# Patient Record
Sex: Female | Born: 1950 | Race: White | Hispanic: No | Marital: Single | Smoking: Current every day smoker
Health system: Southern US, Community
[De-identification: ages and names within clinical notes are randomized; demographics above are authoritative.]

## PROBLEM LIST (undated history)

## (undated) DIAGNOSIS — Z720 Tobacco use: Secondary | ICD-10-CM

## (undated) DIAGNOSIS — F419 Anxiety disorder, unspecified: Secondary | ICD-10-CM

## (undated) DIAGNOSIS — F329 Major depressive disorder, single episode, unspecified: Secondary | ICD-10-CM

## (undated) DIAGNOSIS — E785 Hyperlipidemia, unspecified: Secondary | ICD-10-CM

## (undated) DIAGNOSIS — J329 Chronic sinusitis, unspecified: Secondary | ICD-10-CM

## (undated) DIAGNOSIS — F32A Depression, unspecified: Secondary | ICD-10-CM

## (undated) DIAGNOSIS — R011 Cardiac murmur, unspecified: Secondary | ICD-10-CM

## (undated) DIAGNOSIS — K219 Gastro-esophageal reflux disease without esophagitis: Secondary | ICD-10-CM

## (undated) DIAGNOSIS — K649 Unspecified hemorrhoids: Secondary | ICD-10-CM

## (undated) DIAGNOSIS — I739 Peripheral vascular disease, unspecified: Secondary | ICD-10-CM

## (undated) HISTORY — DX: Chronic sinusitis, unspecified: J32.9

## (undated) HISTORY — PX: HEMORRHOIDECTOMY WITH HEMORRHOID BANDING: SHX5633

## (undated) HISTORY — DX: Major depressive disorder, single episode, unspecified: F32.9

## (undated) HISTORY — DX: Unspecified hemorrhoids: K64.9

## (undated) HISTORY — PX: APPENDECTOMY: SHX54

## (undated) HISTORY — PX: CARDIAC CATHETERIZATION: SHX172

## (undated) HISTORY — DX: Hyperlipidemia, unspecified: E78.5

## (undated) HISTORY — DX: Depression, unspecified: F32.A

## (undated) SURGERY — LEFT HEART CATH AND CORONARY ANGIOGRAPHY
Anesthesia: Moderate Sedation | Laterality: Right

---

## 1975-07-04 HISTORY — PX: TUBAL LIGATION: SHX77

## 1989-07-03 HISTORY — PX: ABDOMINAL HYSTERECTOMY: SHX81

## 2008-01-07 DIAGNOSIS — F418 Other specified anxiety disorders: Secondary | ICD-10-CM | POA: Insufficient documentation

## 2012-07-03 HISTORY — PX: COLONOSCOPY: SHX174

## 2012-12-12 DIAGNOSIS — K259 Gastric ulcer, unspecified as acute or chronic, without hemorrhage or perforation: Secondary | ICD-10-CM | POA: Insufficient documentation

## 2013-03-19 DIAGNOSIS — Z0000 Encounter for general adult medical examination without abnormal findings: Secondary | ICD-10-CM | POA: Insufficient documentation

## 2013-03-19 DIAGNOSIS — N952 Postmenopausal atrophic vaginitis: Secondary | ICD-10-CM | POA: Insufficient documentation

## 2014-05-18 DIAGNOSIS — Z720 Tobacco use: Secondary | ICD-10-CM | POA: Insufficient documentation

## 2014-05-18 DIAGNOSIS — Z79899 Other long term (current) drug therapy: Secondary | ICD-10-CM | POA: Insufficient documentation

## 2014-05-18 DIAGNOSIS — K644 Residual hemorrhoidal skin tags: Secondary | ICD-10-CM | POA: Insufficient documentation

## 2014-05-27 ENCOUNTER — Ambulatory Visit: Payer: Self-pay | Admitting: General Surgery

## 2014-06-01 ENCOUNTER — Encounter: Payer: Self-pay | Admitting: General Surgery

## 2014-06-01 ENCOUNTER — Ambulatory Visit (INDEPENDENT_AMBULATORY_CARE_PROVIDER_SITE_OTHER): Payer: BC Managed Care – PPO | Admitting: General Surgery

## 2014-06-01 VITALS — BP 124/68 | HR 70 | Resp 12 | Ht 66.0 in | Wt 141.0 lb

## 2014-06-01 DIAGNOSIS — K648 Other hemorrhoids: Secondary | ICD-10-CM

## 2014-06-01 DIAGNOSIS — K644 Residual hemorrhoidal skin tags: Secondary | ICD-10-CM

## 2014-06-01 NOTE — Progress Notes (Signed)
Patient ID: Bethany Lozano, female   DOB: 11-26-1950, 63 y.o.   MRN: [id number]  Chief Complaint  Patient presents with  . Rectal Bleeding    HPI Bethany Lozano is a 63 y.o. female here today for evaluation of rectal bleeding. Patient states his has been going over the last 30 years. She had a hemorrhoidectomy and banding performed in the past. Reports she previously only had bleeding 1-2x/year but is currently have bleeding at least 1x/month with accompanying pressure sensation. Her last colonoscopy 09/11/12. She states no pain. Reports feeling as she has increased urgency of bowels HPI  Past Medical History  Diagnosis Date  . Hyperlipidemia   . Hemorrhoids   . Depression   . Sinusitis     Past Surgical History  Procedure Laterality Date  . Hemorrhoidectomy with hemorrhoid banding  1991  . Abdominal hysterectomy  1991  . Tubal ligation  1977  . Colonoscopy  2014    History reviewed. No pertinent family history.  Social History History  Substance Use Topics  . Smoking status: Current Every Day Smoker -- 1.00 packs/day for 40 years    Types: Cigarettes  . Smokeless tobacco: Never Used  . Alcohol Use: 0.0 oz/week    0 Not specified per week    Allergies  Allergen Reactions  . Codeine Nausea Only  . Penicillins Rash    Current Outpatient Prescriptions  Medication Sig Dispense Refill  . aspirin 81 MG tablet Take 81 mg by mouth daily.    . clobetasol (TEMOVATE) 0.05 % GEL   5  . clonazePAM (KLONOPIN) 0.5 MG tablet Take 0.5 mg by mouth 2 (two) times daily as needed.   0  . fenofibrate 160 MG tablet Take 160 mg by mouth daily.   0  . fluticasone (FLONASE) 50 MCG/ACT nasal spray Place 1 spray into both nostrils daily.  0  . mometasone (ELOCON) 0.1 % cream Apply 1 application topically daily.   5  . montelukast (SINGULAIR) 10 MG tablet   0  . PREMARIN 0.3 MG tablet Take 0.3 mg by mouth daily.   3  . VAGIFEM 10 MCG TABS vaginal tablet   2  . VIIBRYD 40 MG TABS 40 mg daily.    0   No current facility-administered medications for this visit.    Review of Systems Review of Systems  Constitutional: Negative.   Respiratory: Negative.   Cardiovascular: Negative.     Blood pressure 124/68, pulse 70, resp. rate 12, height 5\' 6"  (1.676 m), weight 141 lb (63.957 kg).  Physical Exam Physical Exam  Constitutional: She is oriented to person, place, and time. She appears well-developed and well-nourished.  Cardiovascular: Normal rate, regular rhythm and normal heart sounds.   Pulmonary/Chest: Effort normal and breath sounds normal.  Abdominal: Soft. Bowel sounds are normal. There is no hepatosplenomegaly.  Genitourinary:  Irregular shaped external hemorrhoid on left side, not inflamed currently  Also 1 cm internal hemorrhoid noted at 3oclock Digital exam normal  Neurological: She is alert and oriented to person, place, and time.  Skin: Skin is warm and dry.    Data Reviewed Notes reviews  Assessment   internal hemorrhoid, L side. Likely the source of bleeding External hemorrhoid asymptomatic  Discussed this in full with pt and she is agreeable to hemorrhoid banding   Plan    Patient to return for anoscopy and hemorrhoid banding with fleets enema prior to procedure.       Tehani Mersman G 06/01/2014, 2:53 PM

## 2014-06-01 NOTE — Patient Instructions (Signed)
Patient to return for hemorrhoid banding with enema bowel prep.

## 2014-06-09 ENCOUNTER — Ambulatory Visit (INDEPENDENT_AMBULATORY_CARE_PROVIDER_SITE_OTHER): Payer: BC Managed Care – PPO | Admitting: General Surgery

## 2014-06-09 ENCOUNTER — Encounter: Payer: Self-pay | Admitting: General Surgery

## 2014-06-09 VITALS — BP 124/60 | HR 72 | Resp 12 | Ht 65.0 in | Wt 142.0 lb

## 2014-06-09 DIAGNOSIS — K648 Other hemorrhoids: Secondary | ICD-10-CM

## 2014-06-09 NOTE — Progress Notes (Signed)
The patient presents for a procedure today. The procedure to be performed is hemorrhoid banding. The patient denies any new complaints at this time.  Pat placed in left lateral position. Anal area prepped with betadine. 1ml 1% xylocaine instilled at base of internal hemorrhoid on left. The hemorrhoid was successfully banded. No discomfort noted by pt. F/U in 3-4 weeks.

## 2014-06-09 NOTE — Patient Instructions (Signed)
Patient to return in 3-4 weeks for follow up. The patient is aware to call back for any questions or concerns.     

## 2014-07-09 ENCOUNTER — Ambulatory Visit (INDEPENDENT_AMBULATORY_CARE_PROVIDER_SITE_OTHER): Payer: Self-pay | Admitting: General Surgery

## 2014-07-09 ENCOUNTER — Encounter: Payer: Self-pay | Admitting: General Surgery

## 2014-07-09 VITALS — BP 130/70 | HR 80 | Resp 14 | Ht 66.0 in | Wt 143.0 lb

## 2014-07-09 DIAGNOSIS — K648 Other hemorrhoids: Secondary | ICD-10-CM

## 2014-07-09 NOTE — Patient Instructions (Signed)
Patient to return as needed. The patient is aware to call back for any questions or concerns. 

## 2014-07-09 NOTE — Progress Notes (Signed)
Patient ID: Bethany Lozano, female   DOB: 03/07/1951, 64 y.o.   MRN: [id number]   The patient presents for a follow up of hemorrhoid banding. She states the bleeding has subsided.  Rectal exam shows 2 less than 5 mm internal hemorrhoids. The large banded hemorrhoid is not seen.  She is doing great. No new complaints. Follow up as needed.

## 2014-07-10 ENCOUNTER — Encounter: Payer: Self-pay | Admitting: General Surgery

## 2014-10-13 ENCOUNTER — Ambulatory Visit (INDEPENDENT_AMBULATORY_CARE_PROVIDER_SITE_OTHER): Payer: BLUE CROSS/BLUE SHIELD | Admitting: General Surgery

## 2014-10-13 ENCOUNTER — Encounter: Payer: Self-pay | Admitting: General Surgery

## 2014-10-13 VITALS — BP 120/66 | HR 72 | Resp 12 | Ht 66.0 in | Wt 140.0 lb

## 2014-10-13 DIAGNOSIS — K602 Anal fissure, unspecified: Secondary | ICD-10-CM

## 2014-10-13 DIAGNOSIS — K648 Other hemorrhoids: Secondary | ICD-10-CM | POA: Diagnosis not present

## 2014-10-13 NOTE — Progress Notes (Signed)
Patient ID: Bethany Lozano, female   DOB: 07/13/1950, 64 y.o.   MRN: [id number]  Chief Complaint  Patient presents with  . Rectal Bleeding    HPI Bethany Lozano is a 64 y.o. female.here today for a evaluation if rectal bleeding. Patient states this has been going on since 10/09/14  . She states when she wrap there is clots of blood on the paper and in the bowl. No pain.. She had a hemorrhoid banding in 2015.  HPI  Past Medical History  Diagnosis Date  . Hyperlipidemia   . Hemorrhoids   . Depression   . Sinusitis     Past Surgical History  Procedure Laterality Date  . Hemorrhoidectomy with hemorrhoid banding  1991,06/20/14  . Abdominal hysterectomy  1991  . Tubal ligation  1977  . Colonoscopy  2014    History reviewed. No pertinent family history.  Social History History  Substance Use Topics  . Smoking status: Current Every Day Smoker -- 1.00 packs/day for 40 years    Types: Cigarettes  . Smokeless tobacco: Never Used  . Alcohol Use: 0.0 oz/week    0 Standard drinks or equivalent per week    Allergies  Allergen Reactions  . Codeine Nausea Only  . Penicillins Rash    Current Outpatient Prescriptions  Medication Sig Dispense Refill  . aspirin 81 MG tablet Take 81 mg by mouth daily.    Marland Kitchen atorvastatin (LIPITOR) 40 MG tablet Take 40 mg by mouth daily.    . clobetasol (TEMOVATE) 0.05 % GEL   5  . clonazePAM (KLONOPIN) 0.5 MG tablet Take 0.5 mg by mouth 2 (two) times daily as needed.   0  . fenofibrate 160 MG tablet Take 160 mg by mouth daily.   0  . fluticasone (FLONASE) 50 MCG/ACT nasal spray Place 1 spray into both nostrils daily.  0  . mometasone (ELOCON) 0.1 % cream Apply 1 application topically daily.   5  . montelukast (SINGULAIR) 10 MG tablet   0  . PREMARIN 0.3 MG tablet Take 0.3 mg by mouth daily.   3  . VIIBRYD 40 MG TABS 20 mg daily.   0   No current facility-administered medications for this visit.    Review of Systems Review of Systems  Constitutional:  Negative.   Respiratory: Negative.   Cardiovascular: Negative.     Blood pressure 120/66, pulse 72, resp. rate 12, height  (1.676 m), weight 140 lb (63.504 kg).  Physical Exam Physical Exam  Constitutional: She is oriented to person, place, and time. She appears well-developed and well-nourished.  Eyes: Conjunctivae are normal. No scleral icterus.  Neck: Neck supple.  Cardiovascular: Normal rate and regular rhythm.   Murmur heard.  Systolic murmur is present with a grade of 2/6  Pulmonary/Chest: Effort normal.  Abdominal: Soft. Bowel sounds are normal. There is no tenderness.  Genitourinary: Rectal exam shows external hemorrhoid and internal hemorrhoid.     Lymphadenopathy:    She has no cervical adenopathy.  Neurological: She is alert and oriented to person, place, and time.  Skin: Skin is warm and dry.    Data Reviewed Prior note  Assessment    Internal hemorrhoid and possible anal fissure     Plan    Pt has had banding before. Given long history of bleeding will benefit with hemorrhoidectomy and sphincterotomy. Discussed fully with pt and she is agreeable.    Patient is scheduled for surgery at [facility] on 10/22/14. She will pre admit  by phone. Patient is aware of dates, and instructions.   PCP:  Evelena Asaejan-Sie SANKAR,SEEPLAPUTHUR G 10/13/2014, 10:14 AM

## 2014-10-13 NOTE — Patient Instructions (Signed)
Hemorrhoidectomy Hemorrhoidectomy is surgery to remove hemorrhoids. Hemorrhoids are veins that have become swollen in the rectum. The rectum is the area from the bottom end of the intestines to the opening where bowel movements leave the body. Hemorrhoids can be uncomfortable. They can cause itching, bleeding and pain if a blood clot forms in them (thrombose). If hemorrhoids are small, surgery may not be needed. But if they cover a larger area, surgery is usually suggested.  LET YOUR CAREGIVER KNOW ABOUT:   Any allergies.  All medications you are taking, including:  Herbs, eyedrops, over-the-counter medications and creams.  Blood thinners (anticoagulants), aspirin or other drugs that could affect blood clotting.  Use of steroids (by mouth or as creams).  Previous problems with anesthetics, including local anesthetics.  Possibility of pregnancy, if this applies.  Any history of blood clots.  Any history of bleeding or other blood problems.  Previous surgery.  Smoking history.  Other health problems. RISKS AND COMPLICATIONS All surgery carries some risk. However, hemorrhoid surgery usually goes smoothly. Possible complications could include:  Urinary retention.  Bleeding.  Infection.  A painful incision.  A reaction to the anesthesia (this is not common). BEFORE THE PROCEDURE   Stop using aspirin and non-steroidal anti-inflammatory drugs (NSAIDs) for pain relief. This includes prescription drugs and over-the-counter drugs such as ibuprofen and naproxen. Also stop taking vitamin E. If possible, do this two weeks before your surgery.  If you take blood-thinners, ask your healthcare provider when you should stop taking them.  You will probably have blood and urine tests done several days before your surgery.  Do not eat or drink for about 8 hours before the surgery.  Arrive at least an hour before the surgery, or whenever your surgeon recommends. This will give you time to  check in and fill out any needed paperwork.  Hemorrhoidectomy is often an outpatient procedure. This means you will be able to go home the same day. Sometimes, though, people stay overnight in the hospital after the procedure. Ask your surgeon what to expect. Either way, make arrangements in advance for someone to drive you home. PROCEDURE   The preparation:  You will change into a hospital gown.  You will be given an IV. A needle will be inserted in your arm. Medication can flow directly into your body through this needle.  You might be given an enema to clear your rectum.  Once in the operating room, you will probably lie on your side or be repositioned later to lying on your stomach.  You will be given anesthesia (medication) so you will not feel anything during the surgery. The surgery often is done with local anesthesia (the area near the hemorrhoids will be numb and you will be drowsy but awake). Sometimes, general anesthesia is used (you will be asleep during the procedure).  The procedure:  There are a few different procedures for hemorrhoids. Be sure to ask you surgeon about the procedure, the risks and benefits.  Be sure to ask about what you need to do to take care of the wound, if there is one. AFTER THE PROCEDURE  You will stay in a recovery area until the anesthesia has worn off. Your blood pressure and pulse will be checked every so often.  You may feel a lot of pain in the area of the rectum.  Take all pain medication prescribed by your surgeon. Ask before taking any over-the-counter pain medicines.  Sometimes sitting in a warm bath can help relieve   your pain.  To make sure you have bowel movements without straining:  You will probably need to take stool softeners (usually a pill) for a few days.  You should drink 8 to 10 glasses of water each day.  Your activity will be restricted for awhile. Ask your caregiver for a list of what you should and should not do  while you recover. Document Released: 04/16/2009 Document Revised: 09/11/2011 Document Reviewed: 04/16/2009 ExitCare Patient Information 2015 [employer]. This information is not intended to replace advice given to you by your health care provider. Make sure you discuss any questions you have with your health care provider.  

## 2014-10-22 ENCOUNTER — Telehealth: Payer: Self-pay | Admitting: *Deleted

## 2014-10-22 ENCOUNTER — Telehealth: Payer: Self-pay

## 2014-10-22 ENCOUNTER — Encounter: Payer: Self-pay | Admitting: General Surgery

## 2014-10-22 ENCOUNTER — Ambulatory Visit
Admit: 2014-10-22 | Disposition: A | Discharge status: Home / Self Care | Payer: Self-pay | Attending: General Surgery | Admitting: General Surgery

## 2014-10-22 DIAGNOSIS — K648 Other hemorrhoids: Secondary | ICD-10-CM | POA: Diagnosis not present

## 2014-10-22 DIAGNOSIS — K602 Anal fissure, unspecified: Secondary | ICD-10-CM | POA: Diagnosis not present

## 2014-10-22 HISTORY — PX: FISSURECTOMY: SHX5244

## 2014-10-22 NOTE — Telephone Encounter (Signed)
Advised to use salt water gargles or throat lozenges. Tylenol or Advil for pain. Monitor for new symptoms and call if any new symptoms occur, pt agrees.

## 2014-10-22 NOTE — Telephone Encounter (Signed)
Patient called concerned about a raised bruise area at her IV site. She states that it is about 1" across, she denies any redness or pain. Patient instructed to apply cold compress to area several times today and it should resolve itself over the next few days. She will call if she has any further problems.

## 2014-10-22 NOTE — Telephone Encounter (Signed)
Pt has surgery today and she is having a sore throat, she wanted to talk to you about it.

## 2014-10-26 ENCOUNTER — Encounter: Payer: Self-pay | Admitting: General Surgery

## 2014-10-26 LAB — SURGICAL PATHOLOGY

## 2014-11-01 NOTE — Op Note (Signed)
PATIENT NAME:  Bethany MorelHOWLE, Bethany E MR#:  [id number] DATE OF BIRTH:  [birth year]  DATE OF PROCEDURE:  10/22/2014  PREOPERATIVE DIAGNOSIS: Hemorrhoid and anal fissure.   POSTOPERATIVE DIAGNOSIS: Internal and external hemorrhoid on the left posterior aspect and a small posterior anal fissure.   OPERATION PERFORMED: Internal, external hemorrhoidectomy and fissure repair sphincterotomy.   ANESTHESIA: General.   COMPLICATIONS: None.   ESTIMATED BLOOD LOSS: Minimal.   DRAINS: None.   DESCRIPTION OF PROCEDURE: The patient was put to sleep with an LMA and placed in the lithotomy position. The anal area was prepped and draped out as a sterile field. Timeout was performed. Digital and speculum examination was completed, showing a well-defined internal hemorrhoid located around the 5:00 location just to the left of what appeared to be a slightly scarred flat anal fissure. Also, a small prominent external hemorrhoid was identified, not continuous with the internal hemorrhoid, in this same location. The internal hemorrhoid was first removed. It was clamped and excised out and then suture ligated with 3-0 Vicryl. The external hemorrhoid was then taken down with the use of a LigaSure device. 20 mL of Exparel for was instilled in the perianal region and in the intersphincteric groove for postoperative analgesia. Sphincterotomy was made on the right side of the 9 o'clock position with a small radial incision. The edge of the sphincter muscle was lifted up and cut with cautery. The opening was then closed with a 3-0 Vicryl stitch. Examination of the anal canal and the lower rectum showed no other abnormality. The area was covered with bacitracin ointment and the patient subsequently was extubated and returned to the recovery room in stable condition   ____________________________ S.Wynona LunaG. Sankar, MD sgs:AT D: 10/22/2014 09:05:07 ET T: 10/22/2014 11:17:31 ET JOB#: [id number]  cc: S.G. Evette CristalSankar, MD, <Dictator> [facility] Wynona LunaG  SANKAR MD ELECTRONICALLY SIGNED 10/23/2014 8:18

## 2014-11-02 ENCOUNTER — Ambulatory Visit (INDEPENDENT_AMBULATORY_CARE_PROVIDER_SITE_OTHER): Payer: BLUE CROSS/BLUE SHIELD | Admitting: General Surgery

## 2014-11-02 ENCOUNTER — Encounter: Payer: Self-pay | Admitting: General Surgery

## 2014-11-02 ENCOUNTER — Ambulatory Visit: Payer: BLUE CROSS/BLUE SHIELD | Admitting: General Surgery

## 2014-11-02 VITALS — BP 130/82 | HR 72 | Resp 14 | Ht 66.0 in | Wt 137.0 lb

## 2014-11-02 DIAGNOSIS — K602 Anal fissure, unspecified: Secondary | ICD-10-CM

## 2014-11-02 MED ORDER — HYDROCORTISONE 1 % EX CREA: 1.0000 "application " | TOPICAL_CREAM | Freq: Two times a day (BID) | CUTANEOUS | Status: DC

## 2014-11-02 NOTE — Patient Instructions (Signed)
Patient to return in 3-4 weeks.  

## 2014-11-02 NOTE — Progress Notes (Signed)
This is a 64 year old female here today for her post op hemorrhoidectomy and sphincterotomy done on 10/22/14. She states she is still bleeding and she see it when she wraps.  There is a well defined post anal fissure now- this was not noted last time.  Recurrent anal fissure. Trial of Nupercainol ointment Patient to return in 3-4 weeks.

## 2014-11-03 ENCOUNTER — Encounter: Payer: Self-pay | Admitting: General Surgery

## 2014-11-24 ENCOUNTER — Encounter: Payer: Self-pay | Admitting: General Surgery

## 2014-11-24 ENCOUNTER — Ambulatory Visit (INDEPENDENT_AMBULATORY_CARE_PROVIDER_SITE_OTHER): Payer: BLUE CROSS/BLUE SHIELD | Admitting: General Surgery

## 2014-11-24 VITALS — BP 122/74 | HR 72 | Resp 12 | Ht 66.0 in | Wt 136.0 lb

## 2014-11-24 DIAGNOSIS — K602 Anal fissure, unspecified: Secondary | ICD-10-CM

## 2014-11-24 NOTE — Progress Notes (Signed)
This is a 64 year old female here today for her three week follow up hemorrhoidectomy and sphincterotomy done on 10/22/14. Patient states she is doing well with no bleeding.  Fissure is well healed. No hemorrhoids seen Patient to return as needed.

## 2014-11-24 NOTE — Patient Instructions (Signed)
Patient to return as needed. 

## 2014-11-25 ENCOUNTER — Encounter: Payer: Self-pay | Admitting: General Surgery

## 2015-06-07 ENCOUNTER — Other Ambulatory Visit: Payer: Self-pay | Admitting: Internal Medicine

## 2015-06-07 DIAGNOSIS — Z1231 Encounter for screening mammogram for malignant neoplasm of breast: Secondary | ICD-10-CM

## 2015-06-21 ENCOUNTER — Ambulatory Visit
Admission: RE | Admit: 2015-06-21 | Discharge: 2015-06-21 | Disposition: A | Discharge status: Home / Self Care | Payer: BLUE CROSS/BLUE SHIELD | Source: Ambulatory Visit | Attending: Internal Medicine | Admitting: Internal Medicine

## 2015-06-21 DIAGNOSIS — Z1231 Encounter for screening mammogram for malignant neoplasm of breast: Secondary | ICD-10-CM | POA: Insufficient documentation

## 2015-10-21 DIAGNOSIS — I739 Peripheral vascular disease, unspecified: Secondary | ICD-10-CM | POA: Insufficient documentation

## 2015-10-21 DIAGNOSIS — R0989 Other specified symptoms and signs involving the circulatory and respiratory systems: Secondary | ICD-10-CM | POA: Insufficient documentation

## 2015-11-04 DIAGNOSIS — R079 Chest pain, unspecified: Secondary | ICD-10-CM | POA: Insufficient documentation

## 2015-11-08 ENCOUNTER — Encounter: Payer: Self-pay | Admitting: *Deleted

## 2015-11-08 ENCOUNTER — Other Ambulatory Visit: Payer: Self-pay | Admitting: Cardiovascular Disease

## 2015-11-08 ENCOUNTER — Ambulatory Visit
Admission: RE | Admit: 2015-11-08 | Discharge: 2015-11-08 | Disposition: A | Discharge status: Home / Self Care | Payer: BLUE CROSS/BLUE SHIELD | Source: Ambulatory Visit | Attending: Cardiovascular Disease | Admitting: Cardiovascular Disease

## 2015-11-08 ENCOUNTER — Encounter
Admission: RE | Disposition: A | Discharge status: Home / Self Care | Payer: Self-pay | Source: Ambulatory Visit | Attending: Cardiovascular Disease

## 2015-11-08 DIAGNOSIS — F418 Other specified anxiety disorders: Secondary | ICD-10-CM | POA: Insufficient documentation

## 2015-11-08 DIAGNOSIS — Z833 Family history of diabetes mellitus: Secondary | ICD-10-CM | POA: Insufficient documentation

## 2015-11-08 DIAGNOSIS — Z79899 Other long term (current) drug therapy: Secondary | ICD-10-CM | POA: Insufficient documentation

## 2015-11-08 DIAGNOSIS — I739 Peripheral vascular disease, unspecified: Secondary | ICD-10-CM | POA: Diagnosis not present

## 2015-11-08 DIAGNOSIS — I259 Chronic ischemic heart disease, unspecified: Secondary | ICD-10-CM | POA: Diagnosis not present

## 2015-11-08 DIAGNOSIS — I10 Essential (primary) hypertension: Secondary | ICD-10-CM | POA: Insufficient documentation

## 2015-11-08 DIAGNOSIS — Z880 Allergy status to penicillin: Secondary | ICD-10-CM | POA: Insufficient documentation

## 2015-11-08 DIAGNOSIS — E785 Hyperlipidemia, unspecified: Secondary | ICD-10-CM | POA: Insufficient documentation

## 2015-11-08 DIAGNOSIS — Z7951 Long term (current) use of inhaled steroids: Secondary | ICD-10-CM | POA: Insufficient documentation

## 2015-11-08 DIAGNOSIS — Z9889 Other specified postprocedural states: Secondary | ICD-10-CM | POA: Insufficient documentation

## 2015-11-08 DIAGNOSIS — R079 Chest pain, unspecified: Secondary | ICD-10-CM | POA: Diagnosis present

## 2015-11-08 DIAGNOSIS — I2510 Atherosclerotic heart disease of native coronary artery without angina pectoris: Secondary | ICD-10-CM | POA: Insufficient documentation

## 2015-11-08 DIAGNOSIS — F17200 Nicotine dependence, unspecified, uncomplicated: Secondary | ICD-10-CM | POA: Insufficient documentation

## 2015-11-08 DIAGNOSIS — Z8249 Family history of ischemic heart disease and other diseases of the circulatory system: Secondary | ICD-10-CM | POA: Insufficient documentation

## 2015-11-08 DIAGNOSIS — Z8711 Personal history of peptic ulcer disease: Secondary | ICD-10-CM | POA: Diagnosis not present

## 2015-11-08 DIAGNOSIS — R011 Cardiac murmur, unspecified: Secondary | ICD-10-CM | POA: Insufficient documentation

## 2015-11-08 DIAGNOSIS — Z7982 Long term (current) use of aspirin: Secondary | ICD-10-CM | POA: Diagnosis not present

## 2015-11-08 DIAGNOSIS — Z90710 Acquired absence of both cervix and uterus: Secondary | ICD-10-CM | POA: Insufficient documentation

## 2015-11-08 DIAGNOSIS — Z885 Allergy status to narcotic agent status: Secondary | ICD-10-CM | POA: Insufficient documentation

## 2015-11-08 HISTORY — PX: CARDIAC CATHETERIZATION: SHX172

## 2015-11-08 SURGERY — LEFT HEART CATH AND CORONARY ANGIOGRAPHY
Anesthesia: Moderate Sedation

## 2015-11-08 MED ORDER — SODIUM CHLORIDE 0.9% FLUSH: 3.0000 mL | INTRAVENOUS | Status: DC | PRN

## 2015-11-08 MED ORDER — SODIUM CHLORIDE 0.9 % WEIGHT BASED INFUSION
3.0000 mL/kg/h | INTRAVENOUS | Status: DC
  Administered 2015-11-08: 3 mL/kg/h via INTRAVENOUS

## 2015-11-08 MED ORDER — MIDAZOLAM HCL 2 MG/2ML IJ SOLN
INTRAMUSCULAR | Status: AC
  Filled 2015-11-08: qty 2

## 2015-11-08 MED ORDER — SODIUM CHLORIDE 0.9% FLUSH: 3.0000 mL | Freq: Two times a day (BID) | INTRAVENOUS | Status: DC

## 2015-11-08 MED ORDER — ASPIRIN 81 MG PO CHEW
81.0000 mg | CHEWABLE_TABLET | ORAL | Status: AC
  Administered 2015-11-08: 81 mg via ORAL

## 2015-11-08 MED ORDER — FENTANYL CITRATE (PF) 100 MCG/2ML IJ SOLN
INTRAMUSCULAR | Status: AC
  Filled 2015-11-08: qty 2

## 2015-11-08 MED ORDER — ONDANSETRON HCL 4 MG/2ML IJ SOLN: 4.0000 mg | Freq: Four times a day (QID) | INTRAMUSCULAR | Status: DC | PRN

## 2015-11-08 MED ORDER — FENTANYL CITRATE (PF) 100 MCG/2ML IJ SOLN
INTRAMUSCULAR | Status: DC | PRN
  Administered 2015-11-08: 25 ug via INTRAVENOUS
  Administered 2015-11-08: 50 ug via INTRAVENOUS
  Administered 2015-11-08: 25 ug via INTRAVENOUS

## 2015-11-08 MED ORDER — SODIUM CHLORIDE 0.9 % WEIGHT BASED INFUSION: 1.0000 mL/kg/h | INTRAVENOUS | Status: DC

## 2015-11-08 MED ORDER — SODIUM CHLORIDE 0.9 % IV SOLN: 250.0000 mL | INTRAVENOUS | Status: DC | PRN

## 2015-11-08 MED ORDER — ASPIRIN 81 MG PO CHEW
CHEWABLE_TABLET | ORAL | Status: AC
  Filled 2015-11-08: qty 1

## 2015-11-08 MED ORDER — MIDAZOLAM HCL 2 MG/2ML IJ SOLN
INTRAMUSCULAR | Status: DC | PRN
  Administered 2015-11-08: 0.5 mg via INTRAVENOUS
  Administered 2015-11-08: 1 mg via INTRAVENOUS
  Administered 2015-11-08: 0.5 mg via INTRAVENOUS

## 2015-11-08 MED ORDER — ACETAMINOPHEN 325 MG PO TABS: 650.0000 mg | ORAL_TABLET | ORAL | Status: DC | PRN

## 2015-11-08 MED ORDER — HEPARIN (PORCINE) IN NACL 2-0.9 UNIT/ML-% IJ SOLN
INTRAMUSCULAR | Status: AC
Start: 2015-11-08 — End: 2015-11-08
  Filled 2015-11-08: qty 500

## 2015-11-08 SURGICAL SUPPLY — 9 items
CATH INFINITI 5FR ANG PIGTAIL (CATHETERS) ×3 IMPLANT
CATH INFINITI 5FR JL4 (CATHETERS) ×3 IMPLANT
CATH INFINITI JR4 5F (CATHETERS) ×3 IMPLANT
DEVICE CLOSURE MYNXGRIP 5F (Vascular Products) ×3 IMPLANT
KIT MANI 3VAL PERCEP (MISCELLANEOUS) ×3 IMPLANT
NEEDLE PERC 18GX7CM (NEEDLE) ×3 IMPLANT
PACK CARDIAC CATH (CUSTOM PROCEDURE TRAY) ×3 IMPLANT
SHEATH PINNACLE 5F 10CM (SHEATH) ×3 IMPLANT
WIRE EMERALD 3MM-J .035X150CM (WIRE) ×3 IMPLANT

## 2015-11-08 NOTE — Discharge Instructions (Signed)
Angiogram, Care After °Refer to this sheet in the next few weeks. These instructions provide you with information about caring for yourself after your procedure. Your health care provider may also give you more specific instructions. Your treatment has been planned according to current medical practices, but problems sometimes occur. Call your health care provider if you have any problems or questions after your procedure. °WHAT TO EXPECT AFTER THE PROCEDURE °After your procedure, it is typical to have the following: °· Bruising at the catheter insertion site that usually fades within 1-2 weeks. °· Blood collecting in the tissue (hematoma) that may be painful to the touch. It should usually decrease in size and tenderness within 1-2 weeks. °HOME CARE INSTRUCTIONS °· Take medicines only as directed by your health care provider. °· You may shower 24-48 hours after the procedure or as directed by your health care provider. Remove the bandage (dressing) and gently wash the site with plain soap and water. Pat the area dry with a clean towel. Do not rub the site, because this may cause bleeding. °· Do not take baths, swim, or use a hot tub until your health care provider approves. °· Check your insertion site every day for redness, swelling, or drainage. °· Do not apply powder or lotion to the site. °· Do not lift over 10 lb (4.5 kg) for 5 days after your procedure or as directed by your health care provider. °· Ask your health care provider when it is okay to: °¨ Return to work or school. °¨ Resume usual physical activities or sports. °¨ Resume sexual activity. °· Do not drive home if you are discharged the same day as the procedure. Have someone else drive you. °· You may drive 24 hours after the procedure unless otherwise instructed by your health care provider. °· Do not operate machinery or power tools for 24 hours after the procedure or as directed by your health care provider. °· If your procedure was done as an  outpatient procedure, which means that you went home the same day as your procedure, a responsible adult should be with you for the first 24 hours after you arrive home. °· Keep all follow-up visits as directed by your health care provider. This is important. °SEEK MEDICAL CARE IF: °· You have a fever. °· You have chills. °· You have increased bleeding from the catheter insertion site. Hold pressure on the site. °SEEK IMMEDIATE MEDICAL CARE IF: °· You have unusual pain at the catheter insertion site. °· You have redness, warmth, or swelling at the catheter insertion site. °· You have drainage (other than a small amount of blood on the dressing) from the catheter insertion site. °· The catheter insertion site is bleeding, and the bleeding does not stop after 30 minutes of holding steady pressure on the site. °· The area near or just beyond the catheter insertion site becomes pale, cool, tingly, or numb. °  °This information is not intended to replace advice given to you by your health care provider. Make sure you discuss any questions you have with your health care provider. °  °Document Released: 01/05/2005 Document Revised: 07/10/2014 Document Reviewed: 11/20/2012 °Elsevier Interactive Patient Education ©2016 [employer] ° °

## 2015-11-09 ENCOUNTER — Encounter: Payer: Self-pay | Admitting: Cardiovascular Disease

## 2015-11-17 ENCOUNTER — Encounter
Admission: RE | Admit: 2015-11-17 | Discharge: 2015-11-17 | Disposition: A | Discharge status: Home / Self Care | Payer: BLUE CROSS/BLUE SHIELD | Source: Ambulatory Visit | Attending: Vascular Surgery | Admitting: Vascular Surgery

## 2015-11-17 DIAGNOSIS — Z01812 Encounter for preprocedural laboratory examination: Secondary | ICD-10-CM | POA: Diagnosis present

## 2015-11-17 HISTORY — DX: Cardiac murmur, unspecified: R01.1

## 2015-11-17 HISTORY — DX: Peripheral vascular disease, unspecified: I73.9

## 2015-11-17 HISTORY — DX: Anxiety disorder, unspecified: F41.9

## 2015-11-17 HISTORY — DX: Tobacco use: Z72.0

## 2015-11-17 LAB — DIFFERENTIAL
BASOS PCT: 1 %
Basophils Absolute: 0.1 10*3/uL (ref 0–0.1)
EOS ABS: 0.1 10*3/uL (ref 0–0.7)
Eosinophils Relative: 2 %
Lymphocytes Relative: 30 %
Lymphs Abs: 1.7 10*3/uL (ref 1.0–3.6)
Monocytes Absolute: 0.4 10*3/uL (ref 0.2–0.9)
Monocytes Relative: 8 %
NEUTROS ABS: 3.4 10*3/uL (ref 1.4–6.5)
Neutrophils Relative %: 59 %

## 2015-11-17 LAB — CBC
HEMATOCRIT: 40.6 % (ref 35.0–47.0)
Hemoglobin: 13.7 g/dL (ref 12.0–16.0)
MCH: 31.1 pg (ref 26.0–34.0)
MCHC: 33.7 g/dL (ref 32.0–36.0)
MCV: 92.3 fL (ref 80.0–100.0)
PLATELETS: 206 10*3/uL (ref 150–440)
RBC: 4.4 MIL/uL (ref 3.80–5.20)
RDW: 13.9 % (ref 11.5–14.5)
WBC: 5.6 10*3/uL (ref 3.6–11.0)

## 2015-11-17 LAB — BASIC METABOLIC PANEL
ANION GAP: 6 (ref 5–15)
BUN: 10 mg/dL (ref 6–20)
CALCIUM: 9.5 mg/dL (ref 8.9–10.3)
CO2: 30 mmol/L (ref 22–32)
Chloride: 106 mmol/L (ref 101–111)
Creatinine, Ser: 0.71 mg/dL (ref 0.44–1.00)
Glucose, Bld: 81 mg/dL (ref 65–99)
POTASSIUM: 3.6 mmol/L (ref 3.5–5.1)
SODIUM: 142 mmol/L (ref 135–145)

## 2015-11-17 LAB — APTT: APTT: 30 s (ref 24–36)

## 2015-11-17 LAB — SURGICAL PCR SCREEN
MRSA, PCR: NEGATIVE
STAPHYLOCOCCUS AUREUS: NEGATIVE

## 2015-11-17 LAB — TYPE AND SCREEN
ABO/RH(D): O POS
Antibody Screen: NEGATIVE

## 2015-11-17 LAB — PROTIME-INR
INR: 0.95
Prothrombin Time: 12.9 seconds (ref 11.4–15.0)

## 2015-11-17 LAB — ABO/RH: ABO/RH(D): O POS

## 2015-11-17 NOTE — Patient Instructions (Signed)
  Your procedure is scheduled on: Nov 25, 2015 (Thursday) Report to Same Day Surgery 2nd floor [facility] To find out your arrival time please call [phone] between 1PM - 3PM on Nov 24, 2015 (Wednesday)  Remember: Instructions that are not followed completely may result in serious medical risk, up to and including death, or upon the discretion of your surgeon and anesthesiologist your surgery may need to be rescheduled.    _x___ 1. Do not eat food or drink liquids after midnight. No gum chewing or hard candies.     _Singulair_ 2. No Alcohol for 24 hours before or after surgery.   ____ 3. Bring all medications with you on the day of surgery if instructed.    __x__ 4. Notify your doctor if there is any change in your medical condition     (cold, fever, infections).     Do not wear jewelry, make-up, hairpins, clips or nail polish.  Do not wear lotions, powders, or perfumes. You may wear deodorant.  Do not shave 48 hours prior to surgery. Men may shave face and neck.  Do not bring valuables to the hospital.    [facility] for any belongings or valuables.               Contacts, dentures or bridgework may not be worn into surgery.  Leave your suitcase in the car. After surgery it may be brought to your room.  For patients admitted to the hospital, discharge time is determined by your treatment team.   Patients discharged the day of surgery will not be allowed to drive home.    Please read over the following fact sheets that you were given:   [facility] and or MRSA Information   _x___ Take these medicines the morning of surgery with A SIP OF WATER:    1. Singulair  2.Fenofibrate  3.  4.  5.  6.  ____ Fleet Enema (as directed)   _x___ Use CHG Soap or sage wipes as directed on instruction sheet   ____ Use inhalers on the day of surgery and bring to hospital day of surgery  ____ Stop metformin 2 days prior to surgery    ____ Take  1/2 of usual insulin dose the night before surgery and none on the morning of surgery         _x___ Stop aspirin or coumadin, or plavix ( NO ASPIRIN THE MORNING OF SURGERY)  _x__ Stop Anti-inflammatories such as Advil, Aleve, Ibuprofen, Motrin, Naproxen,          Naprosyn, Goodies powders or aspirin products. Ok to take Tylenol.   _x___ Stop supplements until after surgery.  (STOP CO Q 10 NOW)  ____ Bring C-Pap to the hospital.

## 2015-11-17 NOTE — Pre-Procedure Instructions (Signed)
Panel Physicians Referring Physician Case Authorizing Physician   Laurier NancyShaukat A Khan, MD (Primary)      Procedures    Left Heart Cath and Coronary Angiography    Conclusion     Mid LAD to Dist LAD lesion, 30% stenosed.  No significant CAD with normal EF.     Technique and Indications    Cardiac Catheterization Procedure Note  Name: Bethany Lozano MRN: [id number] DOB: January 28, 1951  Procedure: Left Heart Cath  Medications: Sedation: IV Versed, & IV Fentanyl Contrast: Omnipaque  Procedural details: The right groin was prepped, draped, and anesthetized with 1% lidocaine. Using modified Seldinger technique, a sheath was introduced into the right femoral artery. Standard Judkins catheters were used for coronary angiography and left ventriculography. Catheter exchanges were performed over a guidewire. There were no immediate procedural complications. The patient was transferred to the post catheterization recovery area for further monitoring.sedation time 3335m, 2mg  versed, 100 pnhentanel   KHAN,SHAUKAT A MD, [facility] 11/08/2015, 1:12 PM     Estimated blood loss <50 mL. There were no immediate complications during the procedure.    Coronary Findings    Dominance: Right   Left Anterior Descending   . Mid LAD to Dist LAD lesion, 30% stenosed.      Wall Motion    LVEF 60%          Coronary Diagrams    Diagnostic Diagram            Implants     Vascular Products   Device Closure Mynxgrip [id number] - Implanted    Inventory item: Device Closure Mynxgrip [id number] Model/Cat no.: [id number]   Serial no.:  Manufacturer: [employer]   Lot no.: [id number] Size:    As of 11/08/2015    Status: Implanted              PACS Images    Show images for Cardiac catheterization     Link to Procedure Log    Procedure Log      Hemo Data    AO Systolic Cath Pressure AO Diastolic Cath Pressure AO Mean Cath Pressure LV Systolic Cath Pressure LV End  Diastolic   133 62 mmHg 81 mmHg -- --   147 59 mmHg 77 mmHg -- --   -- -- -- 174 mmHg 14 mmHg   -- -- -- 162 mmHg 14 mmHg   -- -- -- 178 mmHg 19 mmHg   172 74 mmHg 84 mmHg -- --    Order-Level Documents:    There are no order-level documents.    Encounter-Level Documents - 11/08/2015:      Scan on 11/11/2015 11:31 AM by Provider Default, MDScan on 11/11/2015 11:31 AM by Provider Default, MD     Scan on 11/11/2015 11:28 AM by Provider Default, MDScan on 11/11/2015 11:28 AM by Provider Default, MD     Scan on 11/11/2015 10:19 AM by Provider Default, MDScan on 11/11/2015 10:19 AM by Provider Default, MD     Scan on 11/11/2015 10:12 AM by Provider Default, MDScan on 11/11/2015 10:12 AM by Provider Default, MD     Scan on 11/11/2015 10:08 AM by Provider Default, MDScan on 11/11/2015 10:08 AM by Provider Default, MD     Electronic signature on 11/08/2015 11:29 AM    Signed    Electronically signed by Laurier NancyShaukat A Khan, MD on 11/08/15 at 1313 EDT    Cardiac catheterization (Order [id number])  Cardiac Cath  Order: [id number]   Released By: Melynda KellerBarbara J Decker  Authorizing: Laurier Nancy, MD   Date: 11/04/2015  Department: [facility] CARDIAC CATH LAB       Order Information     Order Date/Time Release Date/Time Start Date/Time End Date/Time    11/04/15 10:55 AM 11/04/15 10:55 AM 11/04/15 10:56 AM 11/04/15 10:56 AM      Order Details     Frequency Duration Priority Order Class    Once 1 occurrence Routine Hospital Performed      Acc#    [id number]      Authorizing Provider Audit Trail     Date/Time Authorizing Provider Changed by    11/04/2015 10:55 AM Laurier Nancy, MD Melynda Keller      Order Requisition     Cardiac catheterization (Order [id number]) on 11/04/15     Collection Information     Resulting Agency: [facility] Info     Cardiac catheterization (Order #[id number]) on 11/04/15

## 2015-11-25 ENCOUNTER — Inpatient Hospital Stay: Payer: BLUE CROSS/BLUE SHIELD | Admitting: Certified Registered"

## 2015-11-25 ENCOUNTER — Encounter: Payer: Self-pay | Admitting: *Deleted

## 2015-11-25 ENCOUNTER — Inpatient Hospital Stay
Admission: RE | Admit: 2015-11-25 | Discharge: 2015-11-26 | DRG: 039 | Disposition: A | Discharge status: Home / Self Care | Payer: BLUE CROSS/BLUE SHIELD | Source: Ambulatory Visit | Attending: Vascular Surgery | Admitting: Vascular Surgery

## 2015-11-25 ENCOUNTER — Encounter
Admission: RE | Disposition: A | Discharge status: Home / Self Care | Payer: Self-pay | Source: Ambulatory Visit | Attending: Vascular Surgery

## 2015-11-25 DIAGNOSIS — Z8249 Family history of ischemic heart disease and other diseases of the circulatory system: Secondary | ICD-10-CM | POA: Diagnosis not present

## 2015-11-25 DIAGNOSIS — I6521 Occlusion and stenosis of right carotid artery: Secondary | ICD-10-CM

## 2015-11-25 DIAGNOSIS — Z79890 Hormone replacement therapy (postmenopausal): Secondary | ICD-10-CM | POA: Diagnosis not present

## 2015-11-25 DIAGNOSIS — E785 Hyperlipidemia, unspecified: Secondary | ICD-10-CM | POA: Diagnosis present

## 2015-11-25 DIAGNOSIS — F17200 Nicotine dependence, unspecified, uncomplicated: Secondary | ICD-10-CM | POA: Diagnosis present

## 2015-11-25 DIAGNOSIS — Z79899 Other long term (current) drug therapy: Secondary | ICD-10-CM

## 2015-11-25 DIAGNOSIS — F329 Major depressive disorder, single episode, unspecified: Secondary | ICD-10-CM | POA: Diagnosis present

## 2015-11-25 DIAGNOSIS — F419 Anxiety disorder, unspecified: Secondary | ICD-10-CM | POA: Diagnosis present

## 2015-11-25 DIAGNOSIS — I6529 Occlusion and stenosis of unspecified carotid artery: Secondary | ICD-10-CM | POA: Diagnosis present

## 2015-11-25 DIAGNOSIS — Z7982 Long term (current) use of aspirin: Secondary | ICD-10-CM | POA: Diagnosis not present

## 2015-11-25 DIAGNOSIS — I739 Peripheral vascular disease, unspecified: Secondary | ICD-10-CM | POA: Diagnosis present

## 2015-11-25 HISTORY — PX: ENDARTERECTOMY: SHX5162

## 2015-11-25 SURGERY — ENDARTERECTOMY, CAROTID
Anesthesia: General | Laterality: Right | Wound class: Clean

## 2015-11-25 MED ORDER — CLINDAMYCIN PHOSPHATE 300 MG/50ML IV SOLN
INTRAVENOUS | Status: AC
  Administered 2015-11-25: 300 mg via INTRAVENOUS
  Filled 2015-11-25: qty 50

## 2015-11-25 MED ORDER — MIDAZOLAM HCL 2 MG/2ML IJ SOLN
INTRAMUSCULAR | Status: DC | PRN
  Administered 2015-11-25: 2 mg via INTRAVENOUS

## 2015-11-25 MED ORDER — ASPIRIN EC 81 MG PO TBEC: 81.0000 mg | DELAYED_RELEASE_TABLET | Freq: Every day | ORAL | Status: DC

## 2015-11-25 MED ORDER — SODIUM CHLORIDE 0.9 % IV SOLN
INTRAVENOUS | Status: DC
  Administered 2015-11-25 – 2015-11-26 (×2): via INTRAVENOUS

## 2015-11-25 MED ORDER — FLUTICASONE PROPIONATE 50 MCG/ACT NA SUSP
1.0000 | Freq: Every day | NASAL | Status: DC
  Filled 2015-11-25: qty 16

## 2015-11-25 MED ORDER — CEFAZOLIN SODIUM 1 G IJ SOLR
INTRAMUSCULAR | Status: AC
  Filled 2015-11-25: qty 10

## 2015-11-25 MED ORDER — CLINDAMYCIN PHOSPHATE 600 MG/50ML IV SOLN
INTRAVENOUS | Status: AC
  Filled 2015-11-25: qty 50

## 2015-11-25 MED ORDER — CLINDAMYCIN PHOSPHATE 300 MG/50ML IV SOLN
300.0000 mg | Freq: Three times a day (TID) | INTRAVENOUS | Status: AC
  Administered 2015-11-25 – 2015-11-26 (×2): 300 mg via INTRAVENOUS
  Filled 2015-11-25 (×2): qty 50

## 2015-11-25 MED ORDER — ASPIRIN 81 MG PO TABS: 81.0000 mg | ORAL_TABLET | Freq: Every day | ORAL | Status: DC

## 2015-11-25 MED ORDER — ONDANSETRON HCL 4 MG/2ML IJ SOLN
4.0000 mg | Freq: Once | INTRAMUSCULAR | Status: DC | PRN
Start: 2015-11-25 — End: 2015-11-25

## 2015-11-25 MED ORDER — METOPROLOL TARTRATE 5 MG/5ML IV SOLN: 2.0000 mg | INTRAVENOUS | Status: DC | PRN

## 2015-11-25 MED ORDER — DOCUSATE SODIUM 100 MG PO CAPS
100.0000 mg | ORAL_CAPSULE | Freq: Every day | ORAL | Status: DC
  Administered 2015-11-26: 100 mg via ORAL
  Filled 2015-11-25: qty 1

## 2015-11-25 MED ORDER — ACETAMINOPHEN 650 MG RE SUPP: 325.0000 mg | RECTAL | Status: DC | PRN

## 2015-11-25 MED ORDER — FENOFIBRATE 160 MG PO TABS
160.0000 mg | ORAL_TABLET | Freq: Every day | ORAL | Status: DC
  Administered 2015-11-26: 160 mg via ORAL
  Filled 2015-11-25: qty 1

## 2015-11-25 MED ORDER — MAGNESIUM SULFATE 2 GM/50ML IV SOLN
2.0000 g | Freq: Every day | INTRAVENOUS | Status: DC | PRN
  Filled 2015-11-25: qty 50

## 2015-11-25 MED ORDER — LABETALOL HCL 5 MG/ML IV SOLN: 10.0000 mg | INTRAVENOUS | Status: DC | PRN

## 2015-11-25 MED ORDER — HEPARIN SODIUM (PORCINE) 1000 UNIT/ML IJ SOLN
INTRAMUSCULAR | Status: DC | PRN
  Administered 2015-11-25: 5000 [IU] via INTRAVENOUS

## 2015-11-25 MED ORDER — CLINDAMYCIN PHOSPHATE 300 MG/50ML IV SOLN: 300.0000 mg | Freq: Once | INTRAVENOUS | Status: DC

## 2015-11-25 MED ORDER — PHENOL 1.4 % MT LIQD
1.0000 | OROMUCOSAL | Status: DC | PRN
Start: 2015-11-25 — End: 2015-11-26
  Filled 2015-11-25: qty 177

## 2015-11-25 MED ORDER — SUCCINYLCHOLINE CHLORIDE 20 MG/ML IJ SOLN
INTRAMUSCULAR | Status: DC | PRN
  Administered 2015-11-25: 100 mg via INTRAVENOUS

## 2015-11-25 MED ORDER — PROPOFOL 10 MG/ML IV BOLUS
INTRAVENOUS | Status: DC | PRN
  Administered 2015-11-25: 130 mg via INTRAVENOUS

## 2015-11-25 MED ORDER — CLONAZEPAM 0.5 MG PO TABS: 0.5000 mg | ORAL_TABLET | Freq: Every day | ORAL | Status: DC

## 2015-11-25 MED ORDER — NITROGLYCERIN IN D5W 200-5 MCG/ML-% IV SOLN: 5.0000 ug/min | INTRAVENOUS | Status: DC

## 2015-11-25 MED ORDER — HEPARIN SODIUM (PORCINE) 10000 UNIT/ML IJ SOLN
INTRAMUSCULAR | Status: AC
  Filled 2015-11-25: qty 1

## 2015-11-25 MED ORDER — HYDRALAZINE HCL 20 MG/ML IJ SOLN
INTRAMUSCULAR | Status: DC | PRN
  Administered 2015-11-25: 10 mg via INTRAVENOUS

## 2015-11-25 MED ORDER — POTASSIUM CHLORIDE CRYS ER 20 MEQ PO TBCR: 20.0000 meq | EXTENDED_RELEASE_TABLET | Freq: Every day | ORAL | Status: DC | PRN

## 2015-11-25 MED ORDER — LIDOCAINE HCL (CARDIAC) 20 MG/ML IV SOLN
INTRAVENOUS | Status: DC | PRN
  Administered 2015-11-25: 100 mg via INTRAVENOUS

## 2015-11-25 MED ORDER — FAMOTIDINE 20 MG PO TABS
20.0000 mg | ORAL_TABLET | Freq: Once | ORAL | Status: AC
  Administered 2015-11-25: 20 mg via ORAL

## 2015-11-25 MED ORDER — NITROGLYCERIN 0.2 MG/ML ON CALL CATH LAB
INTRAVENOUS | Status: DC | PRN
  Administered 2015-11-25: 20 ug via INTRAVENOUS

## 2015-11-25 MED ORDER — ASPIRIN EC 81 MG PO TBEC
81.0000 mg | DELAYED_RELEASE_TABLET | Freq: Every day | ORAL | Status: DC
  Administered 2015-11-25: 81 mg via ORAL
  Filled 2015-11-25: qty 1

## 2015-11-25 MED ORDER — LIDOCAINE HCL 1 % IJ SOLN
INTRAMUSCULAR | Status: DC | PRN
  Administered 2015-11-25: 10 mL

## 2015-11-25 MED ORDER — MONTELUKAST SODIUM 10 MG PO TABS
10.0000 mg | ORAL_TABLET | ORAL | Status: DC
  Administered 2015-11-26: 10 mg via ORAL
  Filled 2015-11-25: qty 1

## 2015-11-25 MED ORDER — FENTANYL CITRATE (PF) 100 MCG/2ML IJ SOLN
INTRAMUSCULAR | Status: AC
  Filled 2015-11-25: qty 2

## 2015-11-25 MED ORDER — MORPHINE SULFATE (PF) 2 MG/ML IV SOLN: 2.0000 mg | INTRAVENOUS | Status: DC | PRN

## 2015-11-25 MED ORDER — SUGAMMADEX SODIUM 200 MG/2ML IV SOLN
INTRAVENOUS | Status: DC | PRN
  Administered 2015-11-25: 120 mg via INTRAVENOUS

## 2015-11-25 MED ORDER — HYDRALAZINE HCL 20 MG/ML IJ SOLN: 5.0000 mg | INTRAMUSCULAR | Status: DC | PRN

## 2015-11-25 MED ORDER — SODIUM CHLORIDE 0.9 % IV SOLN: 500.0000 mL | Freq: Once | INTRAVENOUS | Status: DC | PRN

## 2015-11-25 MED ORDER — OXYCODONE-ACETAMINOPHEN 5-325 MG PO TABS
1.0000 | ORAL_TABLET | ORAL | Status: DC | PRN
  Administered 2015-11-25 – 2015-11-26 (×6): 1 via ORAL
  Filled 2015-11-25 (×6): qty 1

## 2015-11-25 MED ORDER — FENTANYL CITRATE (PF) 100 MCG/2ML IJ SOLN
25.0000 ug | INTRAMUSCULAR | Status: DC | PRN
  Administered 2015-11-25 (×4): 25 ug via INTRAVENOUS

## 2015-11-25 MED ORDER — GUAIFENESIN-DM 100-10 MG/5ML PO SYRP: 15.0000 mL | ORAL_SOLUTION | ORAL | Status: DC | PRN

## 2015-11-25 MED ORDER — DOPAMINE-DEXTROSE 3.2-5 MG/ML-% IV SOLN: 3.0000 ug/kg/min | INTRAVENOUS | Status: DC

## 2015-11-25 MED ORDER — VILAZODONE HCL 40 MG PO TABS: 20.0000 mg | ORAL_TABLET | ORAL | Status: DC

## 2015-11-25 MED ORDER — ALUM & MAG HYDROXIDE-SIMETH 200-200-20 MG/5ML PO SUSP: 15.0000 mL | ORAL | Status: DC | PRN

## 2015-11-25 MED ORDER — MOMETASONE FUROATE 0.1 % EX CREA
1.0000 "application " | TOPICAL_CREAM | Freq: Every day | CUTANEOUS | Status: DC
  Filled 2015-11-25: qty 15

## 2015-11-25 MED ORDER — DEXAMETHASONE SODIUM PHOSPHATE 10 MG/ML IJ SOLN
INTRAMUSCULAR | Status: DC | PRN
  Administered 2015-11-25: 10 mg via INTRAVENOUS

## 2015-11-25 MED ORDER — FENTANYL CITRATE (PF) 100 MCG/2ML IJ SOLN
INTRAMUSCULAR | Status: DC | PRN
  Administered 2015-11-25: 100 ug via INTRAVENOUS
  Administered 2015-11-25: 50 ug via INTRAVENOUS
  Administered 2015-11-25: 100 ug via INTRAVENOUS

## 2015-11-25 MED ORDER — FAMOTIDINE 20 MG PO TABS
ORAL_TABLET | ORAL | Status: AC
  Administered 2015-11-25: 20 mg via ORAL
  Filled 2015-11-25: qty 1

## 2015-11-25 MED ORDER — ROSUVASTATIN CALCIUM 20 MG PO TABS
40.0000 mg | ORAL_TABLET | Freq: Every day | ORAL | Status: DC
  Administered 2015-11-25: 40 mg via ORAL
  Filled 2015-11-25: qty 2

## 2015-11-25 MED ORDER — HEPARIN SODIUM (PORCINE) 1000 UNIT/ML IJ SOLN
INTRAMUSCULAR | Status: AC
  Filled 2015-11-25: qty 1

## 2015-11-25 MED ORDER — ESMOLOL HCL-SODIUM CHLORIDE 2000 MG/100ML IV SOLN
25.0000 ug/kg/min | INTRAVENOUS | Status: DC
  Filled 2015-11-25: qty 100

## 2015-11-25 MED ORDER — VILAZODONE HCL 20 MG PO TABS
20.0000 mg | ORAL_TABLET | ORAL | Status: DC
  Administered 2015-11-25: 20 mg via ORAL
  Filled 2015-11-25: qty 1

## 2015-11-25 MED ORDER — ROCURONIUM BROMIDE 100 MG/10ML IV SOLN
INTRAVENOUS | Status: DC | PRN
  Administered 2015-11-25: 40 mg via INTRAVENOUS
  Administered 2015-11-25: 10 mg via INTRAVENOUS

## 2015-11-25 MED ORDER — ONDANSETRON HCL 4 MG/2ML IJ SOLN
INTRAMUSCULAR | Status: DC | PRN
Start: 2015-11-25 — End: 2015-11-25
  Administered 2015-11-25: 4 mg via INTRAVENOUS

## 2015-11-25 MED ORDER — ACETAMINOPHEN 325 MG PO TABS
325.0000 mg | ORAL_TABLET | ORAL | Status: DC | PRN
  Administered 2015-11-25: 650 mg via ORAL
  Filled 2015-11-25: qty 2

## 2015-11-25 MED ORDER — LACTATED RINGERS IV SOLN
INTRAVENOUS | Status: DC
  Administered 2015-11-25 (×2): via INTRAVENOUS

## 2015-11-25 MED ORDER — GLYCOPYRROLATE 0.2 MG/ML IJ SOLN
INTRAMUSCULAR | Status: DC | PRN
  Administered 2015-11-25: 0.2 mg via INTRAVENOUS

## 2015-11-25 MED ORDER — ONDANSETRON HCL 4 MG/2ML IJ SOLN
4.0000 mg | Freq: Four times a day (QID) | INTRAMUSCULAR | Status: DC | PRN
  Administered 2015-11-26: 4 mg via INTRAVENOUS
  Filled 2015-11-25: qty 2

## 2015-11-25 MED ORDER — PHENYLEPHRINE HCL 10 MG/ML IJ SOLN
10000.0000 ug | INTRAMUSCULAR | Status: DC | PRN
  Administered 2015-11-25: 20 ug/min via INTRAVENOUS

## 2015-11-25 MED ORDER — FAMOTIDINE IN NACL 20-0.9 MG/50ML-% IV SOLN
20.0000 mg | Freq: Two times a day (BID) | INTRAVENOUS | Status: DC
  Administered 2015-11-25: 20 mg via INTRAVENOUS
  Filled 2015-11-25 (×3): qty 50

## 2015-11-25 MED ORDER — LIDOCAINE HCL (PF) 1 % IJ SOLN
INTRAMUSCULAR | Status: AC
  Filled 2015-11-25: qty 30

## 2015-11-25 MED ORDER — ESTROGENS CONJUGATED 0.3 MG PO TABS
0.3000 mg | ORAL_TABLET | Freq: Every day | ORAL | Status: DC
  Administered 2015-11-26: 0.3 mg via ORAL
  Filled 2015-11-25 (×2): qty 1

## 2015-11-25 MED ORDER — CLOPIDOGREL BISULFATE 75 MG PO TABS
75.0000 mg | ORAL_TABLET | Freq: Every day | ORAL | Status: DC
  Administered 2015-11-26: 75 mg via ORAL
  Filled 2015-11-25: qty 1

## 2015-11-25 MED ORDER — LABETALOL HCL 5 MG/ML IV SOLN
INTRAVENOUS | Status: DC | PRN
  Administered 2015-11-25: 20 mg via INTRAVENOUS

## 2015-11-25 MED ORDER — HEPARIN SODIUM (PORCINE) 1000 UNIT/ML IJ SOLN
INTRAMUSCULAR | Status: DC | PRN
  Administered 2015-11-25: 40 mL via INTRAMUSCULAR

## 2015-11-25 SURGICAL SUPPLY — 60 items
BAG DECANTER FOR FLEXI CONT (MISCELLANEOUS) ×3 IMPLANT
BLADE SURG 15 STRL LF DISP TIS (BLADE) ×1 IMPLANT
BLADE SURG 15 STRL SS (BLADE) ×2
BLADE SURG SZ11 CARB STEEL (BLADE) ×3 IMPLANT
BOOT SUTURE AID YELLOW STND (SUTURE) ×3 IMPLANT
BRUSH SCRUB 4% CHG (MISCELLANEOUS) ×3 IMPLANT
CANISTER SUCT 1200ML W/VALVE (MISCELLANEOUS) ×3 IMPLANT
CATH TRAY 16F METER LATEX (MISCELLANEOUS) ×3 IMPLANT
DRAPE INCISE IOBAN 66X45 STRL (DRAPES) ×3 IMPLANT
DRAPE LAPAROTOMY 77X122 PED (DRAPES) ×3 IMPLANT
DRAPE SHEET LG 3/4 BI-LAMINATE (DRAPES) ×3 IMPLANT
DRSG TEGADERM 4X4.75 (GAUZE/BANDAGES/DRESSINGS) IMPLANT
DRSG TELFA 3X8 NADH (GAUZE/BANDAGES/DRESSINGS) IMPLANT
DURAPREP 26ML APPLICATOR (WOUND CARE) ×3 IMPLANT
ELECT CAUTERY BLADE 6.4 (BLADE) ×3 IMPLANT
ELECT REM PT RETURN 9FT ADLT (ELECTROSURGICAL) ×3
ELECTRODE REM PT RTRN 9FT ADLT (ELECTROSURGICAL) ×1 IMPLANT
EVICEL 2ML SEALANT HUMAN (Miscellaneous) ×3 IMPLANT
GLOVE BIO SURGEON STRL SZ7 (GLOVE) ×6 IMPLANT
GLOVE INDICATOR 7.5 STRL GRN (GLOVE) ×3 IMPLANT
GOWN STRL REUS W/ TWL LRG LVL3 (GOWN DISPOSABLE) ×1 IMPLANT
GOWN STRL REUS W/ TWL XL LVL3 (GOWN DISPOSABLE) ×1 IMPLANT
GOWN STRL REUS W/TWL LRG LVL3 (GOWN DISPOSABLE) ×2
GOWN STRL REUS W/TWL XL LVL3 (GOWN DISPOSABLE) ×2
HEMOSTAT SURGICEL 2X3 (HEMOSTASIS) ×3 IMPLANT
IV NS 250ML (IV SOLUTION) ×2
IV NS 250ML BAXH (IV SOLUTION) ×1 IMPLANT
KIT RM TURNOVER STRD PROC AR (KITS) ×3 IMPLANT
LABEL OR SOLS (LABEL) ×3 IMPLANT
LIQUID BAND (GAUZE/BANDAGES/DRESSINGS) ×3 IMPLANT
LOOP RED MAXI  1X406MM (MISCELLANEOUS) ×4
LOOP VESSEL MAXI 1X406 RED (MISCELLANEOUS) ×2 IMPLANT
LOOP VESSEL MINI 0.8X406 BLUE (MISCELLANEOUS) ×1 IMPLANT
LOOPS BLUE MINI 0.8X406MM (MISCELLANEOUS) ×2
NEEDLE FILTER BLUNT 18X 1/2SAF (NEEDLE) ×2
NEEDLE FILTER BLUNT 18X1 1/2 (NEEDLE) ×1 IMPLANT
NEEDLE HYPO 25X1 1.5 SAFETY (NEEDLE) ×3 IMPLANT
NS IRRIG 1000ML POUR BTL (IV SOLUTION) ×3 IMPLANT
PACK BASIN MAJOR ARMC (MISCELLANEOUS) ×3 IMPLANT
PATCH CAROTID ECM VASC 1X10 (Prosthesis & Implant Heart) ×3 IMPLANT
PENCIL ELECTRO HAND CTR (MISCELLANEOUS) IMPLANT
SHUNT W TPORT 9FR PRUITT F3 (SHUNT) ×3 IMPLANT
SUT MNCRL 4-0 (SUTURE) ×2
SUT MNCRL 4-0 27XMFL (SUTURE) ×1
SUT PROLENE 6 0 BV (SUTURE) ×12 IMPLANT
SUT PROLENE 7 0 BV 1 (SUTURE) ×6 IMPLANT
SUT SILK 2 0 (SUTURE) ×2
SUT SILK 2-0 18XBRD TIE 12 (SUTURE) ×1 IMPLANT
SUT SILK 3 0 (SUTURE) ×2
SUT SILK 3-0 18XBRD TIE 12 (SUTURE) ×1 IMPLANT
SUT SILK 4 0 (SUTURE) ×2
SUT SILK 4-0 18XBRD TIE 12 (SUTURE) ×1 IMPLANT
SUT VIC AB 3-0 SH 27 (SUTURE) ×2
SUT VIC AB 3-0 SH 27X BRD (SUTURE) ×1 IMPLANT
SUTURE MNCRL 4-0 27XMF (SUTURE) ×1 IMPLANT
SYR 20CC LL (SYRINGE) ×3 IMPLANT
SYRINGE 10CC LL (SYRINGE) ×3 IMPLANT
TOWEL OR 17X26 4PK STRL BLUE (TOWEL DISPOSABLE) IMPLANT
TUBING CONNECTING 10 (TUBING) IMPLANT
TUBING CONNECTING 10' (TUBING)

## 2015-11-25 NOTE — H&P (Signed)
  [facility] History & Physical Update  The patient was interviewed and re-examined.  The patient's previous History and Physical has been reviewed and is unchanged.  There is no change in the plan of care. We plan to proceed with the scheduled procedure.  Jaycob Mcclenton, MD  11/25/2015, 2:27 PM

## 2015-11-25 NOTE — Op Note (Signed)
[facility]   OPERATIVE NOTE  PROCEDURE:   1.  Right carotid endarterectomy with CorMatrix arterial patch reconstruction  PRE-OPERATIVE DIAGNOSIS: 1.  Right carotid stenosis 2. Previous amaurosis fugax  POST-OPERATIVE DIAGNOSIS: same as above   SURGEON: Darey Hershberger, MD  ASSISTAFestus BarrenNT(S): None  ANESTHESIA: general  ESTIMATED BLOOD LOSS: 60 cc  FINDING(S): 1.  Right carotid plaque.  SPECIMEN(S):  Carotid plaque (sent to Pathology)  INDICATIONS:   Bethany Lozano is a 65 y.o. female who presents with right carotid stenosis of greater than 70 % and describes previous episodes of amaurosis fugax.  I discussed with the patient the risks, benefits, and alternatives to carotid endarterectomy.  I discussed the differences between carotid stenting and carotid endarterectomy. I discussed the procedural details of carotid endarterectomy with the patient.  The patient is aware that the risks of carotid endarterectomy include but are not limited to: bleeding, infection, stroke, myocardial infarction, death, cranial nerve injuries both temporary and permanent, neck hematoma, possible airway compromise, labile blood pressure post-operatively, cerebral hyperperfusion syndrome, and possible need for additional interventions in the future. The patient is aware of the risks and agrees to proceed forward with the procedure.  DESCRIPTION: After full informed written consent was obtained from the patient, the patient was brought back to the operating room and placed supine upon the operating table.  Prior to induction, the patient received IV antibiotics.  After obtaining adequate anesthesia, the patient was placed into a modified beach chair position with a shoulder roll in place and the patient's neck slightly hyperextended and rotated away from the surgical site.  The patient was prepped in the standard fashion for a carotid endarterectomy.  I made an incision anterior to the  sternocleidomastoid muscle and dissected down through the subcutaneous tissue.  The platysmas was opened with electrocautery.  Then I dissected down to the internal jugular vein and facial vein.  The facial vein is ligated and divided between 2-0 silk ties.  This was dissected posteriorly until I obtained visualization of the common carotid artery.  This was dissected out and then a vessel loop was placed around the common carotid artery. Her bifurcation was quite low.  I then dissected in a periadventitial fashion along the common carotid artery up to the bifurcation.  I then identified the external carotid artery and the superior thyroid artery.  I placed a vessel loop around the superior thyroid artery, and I also dissected out the external carotid artery and placed a vessel loop around it. In the process of this dissection, the hypoglossal nerve was identified and protected from harm.  I then dissected out the internal carotid artery until I identified an area in the internal carotid artery clearly above the stenosis.  I dissected slightly distal to this area, and placed a vessel loop around the artery.  At this point, we gave the patient 5000 units of intravenous heparin.  After this was allowed to circulate for several minutes, I pulled up control on the vessel loops to clamp the internal carotid artery, external carotid artery, superior thyroid artery, and then the common carotid artery.  I then made an arteriotomy in the common carotid artery with a 11 blade, and extended the arteriotomy with a Potts scissor down into the common carotid artery, then I carried the arteriotomy through the bifurcation into the internal carotid artery until I reached an area that was not diseased.  At this point, I took the [place] shunt that previously been  prepared and I inserted it into the internal carotid artery first, and then into the common carotid artery taking care to flush and de-air prior to release of  control. At this point, I started the endarterectomy in the common carotid artery with a [place]field elevator and carried this dissection down into the common carotid artery circumferentially.  Then I transected the plaque at a segment where it was adherent and transected the plaque with Potts scissors.  I then carried this dissection up into the external carotid artery.  The plaque was extracted by unclamping the external carotid artery and performing an eversion endarterectomy.  The dissection was then carried into the internal carotid artery where a nice feathered end point was created with gentle traction.  I passed the plaque off the field as a specimen. At this point I removed all loose flecks and remaining disease possible.  At this point, I was satisfied that the minimal remaining disease was densely adherent to the wall and wall integrity was intact. The distal endpoint was tacked down with two 7-0 Prolene sutures.  I then fashioned a CorMatrix arterial patch for the artery and sewed it in place with two running stitch of 6-0 Prolene.  I started at the distal endpoint and ran one half the length of the arteriotomy.  I then cut and beveled the patch to an appropriate length to match the arteriotomy.  I started the second 6-0 Prolene at the proximal end point.  The medial suture line was completed and the lateral suture line was run approximately one quarter the length of the arteriotomy.  Prior to completing this patch angioplasty, I removed the shunt first from the internal carotid artery, from which there was excellent backbleeding, and clamped it.  Then I removed the shunt from the common carotid artery, from which there was excellent antegrade bleeding, and then clamped it.  At this point, I allowed the external carotid artery to backbleed, which was excellent.  Then I instilled heparinized saline in this patched artery and then completed the patch angioplasty in the usual fashion.  First, I released the  clamp on the external carotid artery, then I released it on the common carotid artery.  After waiting a few seconds, I then released it on the internal carotid artery. Several minutes of pressure were held and 6-0 Prolene patch sutures were used as need for hemostasis.  At this point, I placed Surgicel and Evicel topical hemostatic agents.  There was no more active bleeding in the surgical site.  The sternocleidomastoid space was closed with three interrupted 3-0 Vicryl sutures. I then reapproximated the platysma muscle with a running stitch of 3-0 Vicryl.  The skin was then closed with a running subcuticular 4-0 Monocryl.  The skin was then cleaned, dried and Dermabond was used to reinforce the skin closure.  The patient awakened and was taken to the recovery room in stable condition, following commands and moving all four extremities without any apparent deficits.    COMPLICATIONS: none  CONDITION: stable  Lainee Lehrman  11/25/2015, 5:04 PM

## 2015-11-25 NOTE — Transfer of Care (Signed)
Immediate Anesthesia Transfer of Care Note  Patient: Bethany MorelLaura E Lozano  Procedure(s) Performed: Procedure(s): ENDARTERECTOMY CAROTID (Right)  Patient Location: PACU  Anesthesia Type:General  Level of Consciousness: awake, alert , oriented and patient cooperative  Airway & Oxygen Therapy: Patient Spontanous Breathing and Patient connected to nasal cannula oxygen  Post-op Assessment: Report given to RN and Post -op Vital signs reviewed and stable  Post vital signs: Reviewed and stable  Last Vitals:  Filed Vitals:   11/25/15 1409  BP: 158/73  Pulse: 73  Temp: 35.9 C  Resp: 16    Last Pain: There were no vitals filed for this visit.       Complications: No apparent anesthesia complications

## 2015-11-25 NOTE — Anesthesia Procedure Notes (Signed)
Procedure Name: Intubation Date/Time: 11/25/2015 3:19 PM Performed by: Omer JackWEATHERLY, Bethany Cumming Pre-anesthesia Checklist: Patient identified, Patient being monitored, Timeout performed, Emergency Drugs available and Suction available Patient Re-evaluated:Patient Re-evaluated prior to inductionOxygen Delivery Method: Circle system utilized Preoxygenation: Pre-oxygenation with 100% oxygen Intubation Type: IV induction Ventilation: Mask ventilation without difficulty Laryngoscope Size: Mac and 3 Grade View: Grade I Tube type: Oral Tube size: 7.5 mm Number of attempts: 1 Airway Equipment and Method: Stylet Placement Confirmation: ETT inserted through vocal cords under direct vision,  positive ETCO2 and breath sounds checked- equal and bilateral Secured at: 21 cm Tube secured with: Tape Dental Injury: Teeth and Oropharynx as per pre-operative assessment

## 2015-11-26 ENCOUNTER — Encounter: Payer: Self-pay | Admitting: Vascular Surgery

## 2015-11-26 LAB — BASIC METABOLIC PANEL
Anion gap: 10 (ref 5–15)
BUN: 12 mg/dL (ref 6–20)
CALCIUM: 8.8 mg/dL — AB (ref 8.9–10.3)
CO2: 22 mmol/L (ref 22–32)
CREATININE: 0.75 mg/dL (ref 0.44–1.00)
Chloride: 104 mmol/L (ref 101–111)
Glucose, Bld: 155 mg/dL — ABNORMAL HIGH (ref 65–99)
Potassium: 3.8 mmol/L (ref 3.5–5.1)
SODIUM: 136 mmol/L (ref 135–145)

## 2015-11-26 LAB — CBC
HEMATOCRIT: 36 % (ref 35.0–47.0)
Hemoglobin: 12.1 g/dL (ref 12.0–16.0)
MCH: 31.1 pg (ref 26.0–34.0)
MCHC: 33.7 g/dL (ref 32.0–36.0)
MCV: 92.1 fL (ref 80.0–100.0)
Platelets: 174 10*3/uL (ref 150–440)
RBC: 3.91 MIL/uL (ref 3.80–5.20)
RDW: 14 % (ref 11.5–14.5)
WBC: 8.3 10*3/uL (ref 3.6–11.0)

## 2015-11-26 LAB — GLUCOSE, CAPILLARY: GLUCOSE-CAPILLARY: 144 mg/dL — AB (ref 65–99)

## 2015-11-26 MED ORDER — OXYCODONE-ACETAMINOPHEN 5-325 MG PO TABS: 1.0000 | ORAL_TABLET | ORAL | Status: DC | PRN

## 2015-11-26 MED ORDER — ONDANSETRON HCL 4 MG/2ML IJ SOLN
INTRAMUSCULAR | Status: AC
  Administered 2015-11-26: 4 mg via INTRAVENOUS
  Filled 2015-11-26: qty 2

## 2015-11-26 MED ORDER — ONDANSETRON HCL 4 MG/2ML IJ SOLN
4.0000 mg | INTRAMUSCULAR | Status: DC | PRN
  Administered 2015-11-26: 4 mg via INTRAVENOUS

## 2015-11-26 MED ORDER — FAMOTIDINE 20 MG PO TABS
20.0000 mg | ORAL_TABLET | Freq: Two times a day (BID) | ORAL | Status: DC
  Administered 2015-11-26: 20 mg via ORAL
  Filled 2015-11-26: qty 1

## 2015-11-26 MED ORDER — PROMETHAZINE HCL 25 MG PO TABS
25.0000 mg | ORAL_TABLET | Freq: Four times a day (QID) | ORAL | Status: DC | PRN
Start: 2015-11-26 — End: 2016-05-09

## 2015-11-26 MED ORDER — PROMETHAZINE HCL 25 MG PO TABS: 25.0000 mg | ORAL_TABLET | Freq: Four times a day (QID) | ORAL | Status: DC | PRN

## 2015-11-26 MED ORDER — CLOPIDOGREL BISULFATE 75 MG PO TABS: 75.0000 mg | ORAL_TABLET | Freq: Every day | ORAL | Status: DC

## 2015-11-26 NOTE — Anesthesia Preprocedure Evaluation (Signed)
Anesthesia Evaluation  Patient identified by MRN, date of birth, ID band Patient awake    Reviewed: Allergy & Precautions, H&P , NPO status , Patient's Chart, lab work & pertinent test results, reviewed documented beta blocker date and time   Airway Mallampati: II  TM Distance: >3 FB Neck ROM: full    Dental  (+) Poor Dentition   Pulmonary neg pulmonary ROS, Current Smoker,    Pulmonary exam normal        Cardiovascular + Peripheral Vascular Disease  negative cardio ROS Normal cardiovascular exam Rhythm:regular Rate:Normal     Neuro/Psych PSYCHIATRIC DISORDERS negative neurological ROS  negative psych ROS   GI/Hepatic negative GI ROS, Neg liver ROS,   Endo/Other  negative endocrine ROS  Renal/GU negative Renal ROS  negative genitourinary   Musculoskeletal   Abdominal   Peds  Hematology negative hematology ROS (+)   Anesthesia Other Findings Past Medical History:   Hyperlipidemia                                               Hemorrhoids                                                  Depression                                                   Sinusitis                                                    Heart murmur                                                 Peripheral vascular disease (HCC)                            Anxiety                                                      Tobacco use                                                Past Surgical History:   HEMORRHOIDECTOMY WITH HEMORRHOID BANDING         1991,12/1*   ABDOMINAL HYSTERECTOMY                           1991  TUBAL LIGATION                                   1977         COLONOSCOPY                                      2014         FISSURECTOMY                                     10/22/14      APPENDECTOMY                                                  CARDIAC CATHETERIZATION                         N/A 11/08/2015    Comment:Procedure: Left Heart Cath and Coronary               Angiography;  Surgeon: Laurier Nancy, MD;                Location: [facility] INVASIVE CV LAB;  Service:               Cardiovascular;  Laterality: N/A;   CARDIAC CATHETERIZATION                                         Comment:Mynx placed in right artery after heart               catherization   ENDARTERECTOMY                                  Right 11/25/2015      Comment:Procedure: ENDARTERECTOMY CAROTID;  Surgeon:               Annice Needy, MD;  Location: [facility] ORS;  Service:              Vascular;  Laterality: Right; BMI    Body Mass Index   26.41 kg/m 2     Reproductive/Obstetrics negative OB ROS                             Anesthesia Physical Anesthesia Plan  ASA: III  Anesthesia Plan: General   Post-op Pain Management:    Induction: Intravenous  Airway Management Planned:   Additional Equipment: Arterial line  Intra-op Plan:   Post-operative Plan:   Informed Consent: I have reviewed the patients History and Physical, chart, labs and discussed the procedure including the risks, benefits and alternatives for the proposed anesthesia with the patient or authorized representative who has indicated his/her understanding and acceptance.   Dental Advisory Given  Plan Discussed with: CRNA  Anesthesia Plan Comments:         Anesthesia Quick Evaluation

## 2015-11-26 NOTE — Progress Notes (Signed)
CONCERNING: IV to Oral Route Change Policy  RECOMMENDATION: This patient is receiving famotidine by the intravenous route.  Based on criteria approved by the Pharmacy and Therapeutics Committee, the intravenous medication(s) is/are being converted to the equivalent oral dose form(s).   DESCRIPTION: These criteria include:  The patient is eating (either orally or via tube) and/or has been taking other orally administered medications for a least 24 hours  The patient has no evidence of active gastrointestinal bleeding or impaired GI absorption (gastrectomy, short bowel, patient on TNA or NPO).  If you have questions about this conversion, please contact the Pharmacy Department  []   [phone]( [phone] )  [facility] [x]   [phone]( [phone] )  [facility] []   [phone]( [phone]  [facility] []   [phone]( [phone] )  [facility] []   [phone]( [phone] )  [facility]   Simpson,Michael L, [facility] 11/26/2015 9:01 AM

## 2015-11-26 NOTE — Progress Notes (Signed)
Pt discharged home via wheelchair with family.  Pt alert and oriented, VSS.  IV's discontinued.  Discharge instructions and home medication list/presecriptons reviewed with pt.  Pt states she understands instructions and has no questions.  Follow up appointment made for pt.  Pt valuables returned to pt.

## 2015-11-26 NOTE — Discharge Summary (Signed)
[facility]    Discharge Summary    Patient ID:  Bethany MorelLaura E Sill MRN: [id number] DOB/AGE: 01-19-1951 65 y.o.  Admit date: 11/25/2015 Discharge date: 11/26/2015 Date of Surgery: 11/25/2015 Surgeon: Surgeon(s): Annice NeedyJason S Starlet Gallentine, MD  Admission Diagnosis: CAROTID ARTERY STENOSIS  Discharge Diagnoses:  CAROTID ARTERY STENOSIS  Secondary Diagnoses: Past Medical History  Diagnosis Date  . Hyperlipidemia   . Hemorrhoids   . Depression   . Sinusitis   . Heart murmur   . Peripheral vascular disease (HCC)   . Anxiety   . Tobacco use     Procedure(s): ENDARTERECTOMY CAROTID  Discharged Condition: good  HPI:  Patient with high grade right ICA stenosis  Hospital Course:  Bethany MorelLaura E Negash is a 65 y.o. female is S/P right Procedure(s): ENDARTERECTOMY CAROTID Extubated: in OR Physical exam: neuro exam normal Post-op wounds healing well Pt. Ambulating, voiding and taking PO diet without difficulty. Pt pain controlled with PO pain meds. Labs as below Complications:none  Consults:     Significant Diagnostic Studies: CBC Lab Results  Component Value Date   WBC 8.3 11/26/2015   HGB 12.1 11/26/2015   HCT 36.0 11/26/2015   MCV 92.1 11/26/2015   PLT 174 11/26/2015    BMET    Component Value Date/Time   NA 136 11/26/2015 0157   K 3.8 11/26/2015 0157   CL 104 11/26/2015 0157   CO2 22 11/26/2015 0157   GLUCOSE 155* 11/26/2015 0157   BUN 12 11/26/2015 0157   CREATININE 0.75 11/26/2015 0157   CALCIUM 8.8* 11/26/2015 0157   GFRNONAA >60 11/26/2015 0157   GFRAA >60 11/26/2015 0157   COAG Lab Results  Component Value Date   INR 0.95 11/17/2015     Disposition:  Discharge to :home Discharge Instructions    Discharge patient    Complete by:  As directed             Medication List    TAKE these medications        aspirin 81 MG tablet  Take 81 mg by mouth daily.     clobetasol 0.05 % Gel  Commonly known as:  TEMOVATE     clonazePAM 0.5 MG tablet  Commonly known as:  KLONOPIN  Take 0.5 mg by mouth daily.     clopidogrel 75 MG tablet  Commonly known as:  PLAVIX  Take 1 tablet (75 mg total) by mouth daily with breakfast.     co-enzyme Q-10 30 MG capsule  Take 100 mg by mouth daily.     fenofibrate 160 MG tablet  Take 160 mg by mouth daily. Reported on 11/08/2015     fluticasone 50 MCG/ACT nasal spray  Commonly known as:  FLONASE  Place 1 spray into both nostrils as needed.     hydrocortisone cream 1 %  Apply 1 application topically 2 (two) times daily.     mometasone 0.1 % cream  Commonly known as:  ELOCON  Apply 1 application topically daily.     montelukast 10 MG tablet  Commonly known as:  SINGULAIR  Take 10 mg by mouth every morning.     oxyCODONE-acetaminophen 5-325 MG tablet  Commonly known as:  PERCOCET/ROXICET  Take 1-2 tablets by mouth every 4 (four) hours as needed for moderate pain.     PREMARIN 0.3 MG tablet  Generic drug:  estrogens (conjugated)  Take 0.3 mg by mouth daily.     promethazine 25 MG tablet  Commonly known as:  PHENERGAN  Take 1 tablet (25 mg total) by mouth every 6 (six) hours as needed for nausea or vomiting.     rosuvastatin 40 MG tablet  Commonly known as:  CRESTOR  Take 40 mg by mouth at bedtime.     VIIBRYD 40 MG Tabs  Generic drug:  Vilazodone HCl  Take 20 mg by mouth every 3 (three) days.     Vitamin D3 5000 units Caps  Take by mouth.       Verbal and written Discharge instructions given to the patient. Wound care per Discharge AVS     Follow-up Information    Follow up with [facility], PA-C In 3 weeks.   Specialty:  Physician Assistant   Contact information:   [address] [place] [address] [phone]       SignedFestus Barren, MD  11/26/2015, 2:44 PM

## 2015-11-26 NOTE — Anesthesia Postprocedure Evaluation (Signed)
Anesthesia Post Note  Patient: Bethany MorelLaura E Lozano  Procedure(s) Performed: Procedure(s) (LRB): ENDARTERECTOMY CAROTID (Right)  Patient location during evaluation: PACU Anesthesia Type: General Level of consciousness: awake and alert Pain management: pain level controlled Vital Signs Assessment: post-procedure vital signs reviewed and stable Respiratory status: spontaneous breathing, nonlabored ventilation, respiratory function stable and patient connected to nasal cannula oxygen Cardiovascular status: blood pressure returned to baseline and stable Postop Assessment: no signs of nausea or vomiting Anesthetic complications: no    Last Vitals:  Filed Vitals:   11/26/15 0900 11/26/15 1000  BP: 140/77 131/60  Pulse: 65 48  Temp:    Resp: 16 18    Last Pain:  Filed Vitals:   11/26/15 1034  PainSc: 0-No pain                 Yevette EdwardsJames G Adams

## 2015-11-26 NOTE — Anesthesia Postprocedure Evaluation (Signed)
Anesthesia Post Note  Patient: Bethany MorelLaura E Lozano  Procedure(s) Performed: Procedure(s) (LRB): ENDARTERECTOMY CAROTID (Right)  Patient location during evaluation: ICU Anesthesia Type: General Level of consciousness: awake and awake and alert Pain management: satisfactory to patient Vital Signs Assessment: post-procedure vital signs reviewed and stable Respiratory status: respiratory function stable Cardiovascular status: stable Anesthetic complications: no    Last Vitals:  Filed Vitals:   11/26/15 0600 11/26/15 0700  BP: 115/51 123/55  Pulse: 53 51  Temp:    Resp: 17 14    Last Pain:  Filed Vitals:   11/26/15 0706  PainSc: 0-No pain                 Clydene PughBeane, Kalyn Hofstra D

## 2015-11-26 NOTE — Discharge Instructions (Signed)
Carotid Angioplasty With Stent, Care After These instructions give you information about caring for yourself after your procedure. Your doctor may also give you more specific instructions. Call your doctor if you have any problems or questions after your procedure. HOME CARE  Take medicines only as told by your doctor.  You may shower. Take off the bandage and gently wash the area where the tube was inserted with soap and water. Pat the area dry with a clean towel. Do not rub the area.  Do not take baths, swim, or use a hot tub.  Check the area where the tube was inserted every day. Check for redness, swelling, and fluid coming from the area.  Do not put powder or lotion on the area.  Do not lift heavy objects. This is anything weighing over 10 lb (4.5 kg).  Ask your doctor when it is okay to:  Go back to work or school.  Do physical activities or play sports.  Have sex.  Eat a diet that is healthy for your heart. This includes:  Fresh fruits and vegetables.  Lean cuts of meat.  Avoid:  Foods high in salt.  Canned or very processed foods.  Food that is high in saturated fat or sugar.  Fried food.  Make other healthy changes as told by your doctor, which may include these:  Do not smoke, chew tobacco, or use electronic cigarettes.  Keep a healthy weight.  Exercise often.  Manage your blood pressure.  Limit how much alcohol you drink.  Manage other health problems, such as diabetes and sleep apnea.  Keep all follow-up visits as told by your doctor. This is important. GET HELP IF:  You have a fever.  You have chills.  You have more bleeding from the area where the tube was inserted. Hold pressure on the area. GET HELP RIGHT AWAY IF:  You have vision changes or loss of vision.  You have numbness or weakness on one side of your body.  You have trouble talking, or you have slurred speech or cannot speak.  You feel confused or have trouble  remembering.  You have a lot of pain at the area where the tube was inserted.  You have redness, warmth, or swelling at the area where the tube was inserted.  You have fluid coming from the area where the tube was inserted. This is more than a small amount of blood on the bandage.  The area where the tube was inserted is bleeding, and the bleeding does not stop after 30 minutes of holding steady pressure on the area. These symptoms may be an emergency. Do not wait to see if the symptoms will go away. Get help right away. Call your local emergency services (911 in [place] Do not drive yourself to the hospital.   This information is not intended to replace advice given to you by your health care provider. Make sure you discuss any questions you have with your health care provider.   Document Released: 06/24/2013 Document Revised: 07/10/2014 Document Reviewed: 06/24/2013 Elsevier Interactive Patient Education 2016 [employer]  Carotid Angioplasty With Stent Carotid angioplasty is a surgery to widen or open a narrowed artery in the neck (carotid artery). This is done by using a small, mesh tube (stent). This surgery is done when the carotid arteries become blocked with buildup (plaque). This buildup decreases blood flow to the brain. The stent provides support and stays in place to keep the carotid artery open so that blood can  flow to the brain. BEFORE THE PROCEDURE  Ask your doctor about changing or stopping your medicines. This is very important if you are taking diabetes medicines or blood thinners.  Blood work will be done.  Do not eat or drink after midnight on the night before the surgery or as told by your doctor. Ask your doctor if it is okay to take a sip of water with any medicines.  Do not smoke if you smoke.  Plan to have a ride home. Do not drive yourself home. PROCEDURE   A medicine is used to numb the groin or arm area where the tube (catheter) will be inserted.  A  small surgical cut (incision) is made in the groin or arm. The tube is put into an artery where the cut was made. The tube is then guided to carotid arteries with the help of an X-ray machine.  When the tube is in the carotid artery, a dye is injected through the tube. The dye allows the inside of the carotid artery to show up on the X-ray.  A filter will be placed in the carotid artery before the stent is placed. This filter will catch any buildup that breaks off during the surgery. It helps stop the buildup from going to the brain and causing a stroke.  The stent will then be placed where the artery is narrow. The stent will stay there forever. In time, the tissue inside the artery will heal and grow around the stent.  The tube will be removed. Pressure will be applied to stop bleeding. AFTER THE PROCEDURE  You will need to stay in bed for a few hours after surgery. You will have to stay flat during this time.  Keep the arm or leg that was used during surgery still. Do not bend, flex, or move the arm or leg. This can cause bleeding.  The surgery site will be watched and checked often for bleeding or puffiness (swelling).  You may get to go home the same day if there are no problems. Someone will need to drive you home.  A person must stay with you for 24 hours.  You will be given antiplatelet medicine. Be sure you understand the dose and how long you will need to take this medicine. Do not stop taking this medicine without talking to your doctor. Stopping this medicine could cause you to develop a blood clot.   This information is not intended to replace advice given to you by your health care provider. Make sure you discuss any questions you have with your health care provider.   Document Released: 06/24/2013 Document Revised: 07/10/2014 Document Reviewed: 06/24/2013 Elsevier Interactive Patient Education [employer]

## 2015-12-01 LAB — SURGICAL PATHOLOGY

## 2016-04-21 ENCOUNTER — Other Ambulatory Visit (INDEPENDENT_AMBULATORY_CARE_PROVIDER_SITE_OTHER): Payer: Self-pay | Admitting: Vascular Surgery

## 2016-04-21 ENCOUNTER — Ambulatory Visit (INDEPENDENT_AMBULATORY_CARE_PROVIDER_SITE_OTHER): Payer: BLUE CROSS/BLUE SHIELD

## 2016-04-21 ENCOUNTER — Ambulatory Visit (INDEPENDENT_AMBULATORY_CARE_PROVIDER_SITE_OTHER): Payer: BLUE CROSS/BLUE SHIELD | Admitting: Vascular Surgery

## 2016-04-21 ENCOUNTER — Encounter (INDEPENDENT_AMBULATORY_CARE_PROVIDER_SITE_OTHER): Payer: Self-pay | Admitting: Vascular Surgery

## 2016-04-21 VITALS — BP 160/82 | HR 63 | Resp 17 | Ht 65.0 in | Wt 135.0 lb

## 2016-04-21 DIAGNOSIS — I6523 Occlusion and stenosis of bilateral carotid arteries: Secondary | ICD-10-CM

## 2016-04-21 DIAGNOSIS — F17200 Nicotine dependence, unspecified, uncomplicated: Secondary | ICD-10-CM

## 2016-04-21 DIAGNOSIS — E785 Hyperlipidemia, unspecified: Secondary | ICD-10-CM

## 2016-04-21 NOTE — Assessment & Plan Note (Signed)
lipid control important in reducing the progression of atherosclerotic disease. Continue statin therapy with Crestor which is working well per her most recent labs

## 2016-04-21 NOTE — Patient Instructions (Signed)
°Carotid Artery Disease °The carotid arteries are the two main arteries on either side of the neck that supply blood to the brain. Carotid artery disease, also called carotid artery stenosis, is the narrowing or blockage of one or both carotid arteries. Carotid artery disease increases your risk for a stroke or a transient ischemic attack (TIA). A TIA is an episode in which a waxy, fatty substance that accumulates within the artery (plaque) blocks blood flow to the brain. A TIA is considered a "warning stroke."  °CAUSES  °· Buildup of plaque inside the carotid arteries (atherosclerosis) (common). °· A weakened outpouching in an artery (aneurysm). °· Inflammation of the carotid artery (arteritis). °· A fibrous growth within the carotid artery (fibromuscular dysplasia). °· Tissue death within the carotid artery due to radiation treatment (post-radiation necrosis). °· Decreased blood flow due to spasms of the carotid artery (vasospasm). °· Separation of the walls of the carotid artery (carotid dissection). °RISK FACTORS °· High cholesterol (dyslipidemia).   °· High blood pressure (hypertension).   °· Smoking.   °· Obesity.   °· Diabetes.   °· Family history of cardiovascular disease.   °· Inactivity or lack of regular exercise.   °· Being female. Men have an increased risk of developing atherosclerosis earlier in life than women.   °SYMPTOMS  °Carotid artery disease does not cause symptoms. °DIAGNOSIS °Diagnosis of carotid artery disease may include:  °· A physical exam. Your health care provider may hear an abnormal sound (bruit) when listening to the carotid arteries.   °· Specific tests that look at the blood flow in the carotid arteries. These tests include:   °¨ Carotid artery ultrasonography.   °¨ Carotid or cerebral angiography.   °¨ Computerized tomographic angiography (CTA).   °¨ Magnetic resonance angiography (MRA).   °TREATMENT  °Treatment of carotid artery disease can include a combination of treatments.  Treatment options include: °· Surgery. You may have:   °¨ A carotid endarterectomy. This is a surgery to remove the blockages in the carotid arteries.   °¨ A carotid angioplasty with stenting. This is a nonsurgical interventional procedure. A wire mesh (stent) is used to widen the blocked carotid arteries.   °· Medicines to control blood pressure, cholesterol, and reduce blood clotting (antiplatelet therapy).   °· Adjusting your diet.   °· Lifestyle changes such as:   °¨ Quitting smoking.   °¨ Exercising as tolerated or as directed by your health care provider.   °¨ Controlling and maintaining a good blood pressure.   °¨ Keeping cholesterol levels under control.   °HOME CARE INSTRUCTIONS  °· Take medicines only as directed by your health care provider. Make sure you understand all your medicine instructions. Do not stop your medicines without talking to your health care provider.   °· Follow your health care provider's diet instructions. It is important to eat a healthy diet that is low in saturated fats and includes plenty of fresh fruits, vegetables, and lean meats. High-fat, high-sodium foods as well as foods that are fried, overly processed, or have poor nutritional value should be avoided. °· Maintain a healthy weight.   °· Stay physically active. It is recommended that you get at least 30 minutes of activity every day.   °· Do not use any tobacco products including cigarettes, chewing tobacco, or electronic cigarettes. If you need help quitting, ask your health care provider. °· Limit alcohol use to:   °¨ No more than 2 drinks per day for men.   °¨ No more than 1 drink per day for nonpregnant women.   °· Do not use illegal drugs.   °· Keep all follow-up visits as directed by your health   care provider.   °SEEK IMMEDIATE MEDICAL CARE IF:  °You develop TIA or stroke symptoms. These include:  °· Sudden weakness or numbness on one side of the body, such as in the face, arm, or leg.   °· Sudden confusion.    °· Trouble speaking (aphasia) or understanding.   °· Sudden trouble seeing out of one or both eyes.   °· Sudden trouble walking.   °· Dizziness or feeling like you might faint.   °· Loss of balance or coordination.   °· Sudden severe headache with no known cause.   °· Sudden trouble swallowing (dysphagia).   °If you have any of these symptoms, call your local emergency services (911 in [place] Do not drive yourself to the clinic or hospital. This is a medical emergency.  °  °This information is not intended to replace advice given to you by your health care provider. Make sure you discuss any questions you have with your health care provider. °  °Document Released: 09/11/2011 Document Revised: 07/10/2014 Document Reviewed: 12/18/2012 °Elsevier Interactive Patient Education ©2016 Else[employer] Inc. ° °

## 2016-04-21 NOTE — Assessment & Plan Note (Signed)
The patient has about 4-5 months status post right carotid endarterectomy for high-grade stenosis with amaurosis fugax. 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 The fact that her duplex was normal after the procedure one month would indicate this is not residual disease but recurrent disease. She will continue her aspirin, Plavix, and statin therapy. We will obtain a CT angiogram for further evaluation. We discussed that if this is indeed high-grade disease, percutaneous intervention would be planned for further therapy.

## 2016-04-21 NOTE — Assessment & Plan Note (Signed)
We discussed the absolute need for smoking cessation due to the deleterious nature of tobacco on the vascular system. We discussed the tobacco use would diminish patency of any intervention, and likely significantly worsen progressio of disease. We discussed multiple agents for quitting including replacement therapy or medications to reduce cravings such as Chantix. The patient voices their understanding of the importance of smoking cessation. 

## 2016-04-21 NOTE — Progress Notes (Signed)
MRN : [id number]  Bethany Lozano is a 65 y.o. ([birth year]) female who presents with chief complaint of  Chief Complaint  Patient presents with  . Follow-up  .  History of Present Illness: Patient returns in follow up 4-5 months after right CEA.  She is still having headaches and tightness in her neck.  She denies recent focal neurologic symptoms and has not had recurrent amaurosis fugax since surgery.  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 The fact that her duplex was normal after the procedure one month would indicate this is not residual disease but recurrent disease.  Current Outpatient Prescriptions  Medication Sig Dispense Refill  . aspirin 81 MG tablet Take 81 mg by mouth daily.    . Cholecalciferol (VITAMIN D3) 5000 units CAPS Take by mouth.    . clobetasol (TEMOVATE) 0.05 % GEL   5  . clonazePAM (KLONOPIN) 0.5 MG tablet Take 0.5 mg by mouth daily.   0  . clopidogrel (PLAVIX) 75 MG tablet Take 1 tablet (75 mg total) by mouth daily with breakfast. 30 tablet 3  . co-enzyme Q-10 30 MG capsule Take 100 mg by mouth daily.     . fluticasone (FLONASE) 50 MCG/ACT nasal spray Place 1 spray into both nostrils as needed.   0  . hydrocortisone cream 1 % Apply 1 application topically 2 (two) times daily. 30 g 1  . mometasone (ELOCON) 0.1 % cream Apply 1 application topically daily.   5  . montelukast (SINGULAIR) 10 MG tablet Take 10 mg by mouth every morning.   0  . PREMARIN 0.3 MG tablet Take 0.3 mg by mouth daily.   3  . rosuvastatin (CRESTOR) 40 MG tablet Take 40 mg by mouth at bedtime.     Marland Kitchen VIIBRYD 40 MG TABS Take 20 mg by mouth every 3 (three) days.   0  . fenofibrate  160 MG tablet Take 160 mg by mouth daily. Reported on 11/08/2015  0  . oxyCODONE-acetaminophen (PERCOCET/ROXICET) 5-325 MG tablet Take 1-2 tablets by mouth every 4 (four) hours as needed for moderate pain. (Patient not taking: Reported on 04/21/2016) 30 tablet 0  . promethazine (PHENERGAN) 25 MG tablet Take 1 tablet (25 mg total) by mouth every 6 (six) hours as needed for nausea or vomiting. (Patient not taking: Reported on 04/21/2016) 30 tablet 0   No current facility-administered medications for this visit.     Past Medical History:  Diagnosis Date  . Anxiety   . Depression   . Heart murmur   . Hemorrhoids   . Hyperlipidemia   . Peripheral vascular disease (HCC)   . Sinusitis   . Tobacco use     Past Surgical History:  Procedure Laterality Date  . ABDOMINAL HYSTERECTOMY  1991  . APPENDECTOMY    . CARDIAC CATHETERIZATION N/A 11/08/2015   Procedure: Left Heart Cath and Coronary Angiography;  Surgeon: Laurier Nancy, MD;  Location: [facility];  Service: Cardiovascular;  Laterality: N/A;  . CARDIAC CATHETERIZATION     Mynx placed in right artery after heart catherization  . COLONOSCOPY  2014  . ENDARTERECTOMY Right 11/25/2015   Procedure: ENDARTERECTOMY CAROTID;  Surgeon: Annice Needy, MD;  Location: [facility];  Service: Vascular;  Laterality: Right;  . FISSURECTOMY  10/22/14  . HEMORRHOIDECTOMY WITH HEMORRHOID BANDING  1991,06/20/14, 10/22/14  . TUBAL LIGATION  1977    Social History Social History  Substance Use Topics  . Smoking status: Current Every Day Smoker    Packs/day: 1.00    Years: 45.00    Types: Cigarettes  . Smokeless tobacco: Never Used  . Alcohol use 0.0 oz/week  No IVDU  Family History Family History  Problem Relation Age of Onset  . Breast cancer Neg Hx   no history of bleeding disorders, clotting disorders, or aneurysms  Allergies  Allergen Reactions  . Codeine Nausea Only  . Adhesive [Tape] Itching and Rash  . Penicillins Rash     REVIEW  OF SYSTEMS (Negative unless checked)  Constitutional: [] Weight loss  [] Fever  [] Chills Cardiac: [] Chest pain   [] Chest pressure   [] Palpitations   [] Shortness of breath when laying flat   [] Shortness of breath at rest   [x] Shortness of breath with exertion. 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 Genitourinary:  [] Chronic kidney disease   [] Difficult urination  [] Frequent urination  [] Burning with urination   [] Blood in urine Skin:  [] Rashes   [] Ulcers   [] Wounds Psychological:  [] History of anxiety   []  History of major depression.  Physical Examination  Vitals:   04/21/16 1107 04/21/16 1108  BP: (!) 144/79 (!) 160/82  Pulse: 63   Resp: 17   Weight: 135 lb (61.2 kg)   Height: 5\' 5"  (1.651 m)    Body mass index is 22.47 kg/m. Gen:  WD/WN, NAD Head: Hart/AT, No temporalis wasting. Ear/Nose/Throat: Hearing grossly intact, nares w/o erythema or drainage, trachea midline Eyes: PERRLA, EOMI. Sclera non-icteric Neck: Supple, no nuchal rigidity.  No JVD.  Pulmonary:  Good air movement, equal and clear to auscultation bilaterally.  Cardiac: RRR,  normal S1, S2, no Murmurs, rubs or gallops. Vascular:  Vessel Right Left  Radial Palpable Palpable  Ulnar Palpable Palpable  Brachial Palpable Palpable  Carotid Palpable, with bruit Palpable, without bruit  Aorta Not palpable N/A  Femoral Palpable Palpable  Popliteal Palpable Palpable  PT Palpable Palpable  DP Palpable Palpable   Gastrointestinal: soft, non-tender/non-distended. No guarding/reflex.  Musculoskeletal: M/S 5/5 throughout.  No deformity or atrophy. No LE edema. Neurologic: CN 2-12 intact. Pain and light touch intact in extremities.  Symmetrical.  Speech is fluent. Motor exam as listed above. Psychiatric: Judgment intact, Mood & affect appropriate for pt's clinical situation. Dermatologic: No rashes or ulcers noted.  No  cellulitis or open wounds. Lymph : No Cervical, Axillary, or Inguinal lymphadenopathy.     CBC Lab Results  Component Value Date   WBC 8.3 11/26/2015   HGB 12.1 11/26/2015   HCT 36.0 11/26/2015   MCV 92.1 11/26/2015   PLT 174 11/26/2015    BMET    Component Value Date/Time   NA 136 11/26/2015 0157   K 3.8 11/26/2015 0157   CL 104 11/26/2015 0157   CO2 22 11/26/2015 0157   GLUCOSE 155 (H) 11/26/2015 0157   BUN 12 11/26/2015 0157   CREATININE 0.75 11/26/2015 0157   CALCIUM 8.8 (L) 11/26/2015 0157   GFRNONAA >60 11/26/2015 0157   GFRAA >60 11/26/2015 0157   CrCl cannot be calculated (Patient's most recent lab result is older than the maximum 21 days allowed.).  COAG Lab Results  Component Value Date   INR 0.95 11/17/2015    Radiology No results found.    Assessment/Plan Hyperlipidemia lipid control important in reducing the progression of atherosclerotic disease. Continue statin therapy with Crestor which is working well per her most recent labs   Tobacco use disorder We discussed the absolute need for smoking cessation due to the deleterious nature of tobacco on the vascular system. We discussed the tobacco use would diminish  patency of any intervention, and likely significantly worsen progressio of disease. We discussed multiple agents for quitting including replacement therapy or medications to reduce cravings such as Chantix. The patient voices their understanding of the importance of smoking cessation.   Carotid stenosis The patient has about 4-5 months status post right carotid endarterectomy for high-grade stenosis with amaurosis fugax. 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 The fact that her duplex was normal after the procedure one month would indicate this is not residual disease but recurrent disease. She will continue her aspirin, Plavix, and statin therapy. We will obtain a CT angiogram for further evaluation. We discussed that if this is indeed high-grade disease, percutaneous intervention would be planned for further therapy.    Festus BarrenJason TRUE Shackleford, MD  04/21/2016 12:05 PM    This note was created with Dragon medical transcription system.  Any errors from dictation are purely unintentional

## 2016-05-02 ENCOUNTER — Ambulatory Visit
Admission: RE | Admit: 2016-05-02 | Discharge: 2016-05-02 | Disposition: A | Discharge status: Home / Self Care | Payer: BLUE CROSS/BLUE SHIELD | Source: Ambulatory Visit | Attending: Vascular Surgery | Admitting: Vascular Surgery

## 2016-05-02 DIAGNOSIS — I6523 Occlusion and stenosis of bilateral carotid arteries: Secondary | ICD-10-CM | POA: Diagnosis not present

## 2016-05-02 LAB — POCT I-STAT CREATININE: CREATININE: 0.7 mg/dL (ref 0.44–1.00)

## 2016-05-02 MED ORDER — IOPAMIDOL (ISOVUE-370) INJECTION 76%
75.0000 mL | Freq: Once | INTRAVENOUS | Status: AC | PRN
  Administered 2016-05-02: 75 mL via INTRAVENOUS

## 2016-05-05 ENCOUNTER — Encounter (INDEPENDENT_AMBULATORY_CARE_PROVIDER_SITE_OTHER): Payer: Self-pay | Admitting: Vascular Surgery

## 2016-05-05 ENCOUNTER — Ambulatory Visit (INDEPENDENT_AMBULATORY_CARE_PROVIDER_SITE_OTHER): Payer: BLUE CROSS/BLUE SHIELD | Admitting: Vascular Surgery

## 2016-05-05 ENCOUNTER — Encounter (INDEPENDENT_AMBULATORY_CARE_PROVIDER_SITE_OTHER): Payer: Self-pay

## 2016-05-05 VITALS — BP 185/80 | HR 79 | Resp 17 | Ht 65.0 in | Wt 139.0 lb

## 2016-05-05 DIAGNOSIS — F17200 Nicotine dependence, unspecified, uncomplicated: Secondary | ICD-10-CM

## 2016-05-05 DIAGNOSIS — I6523 Occlusion and stenosis of bilateral carotid arteries: Secondary | ICD-10-CM

## 2016-05-05 DIAGNOSIS — E785 Hyperlipidemia, unspecified: Secondary | ICD-10-CM | POA: Diagnosis not present

## 2016-05-05 NOTE — Assessment & Plan Note (Signed)
lipid control important in reducing the progression of atherosclerotic disease. Continue statin therapy  

## 2016-05-05 NOTE — Patient Instructions (Signed)
Carotid Angioplasty With Stent Carotid angioplasty is a procedure to widen or open a narrowed artery in the neck (carotid artery) using a small, metal, mesh tube (stent). The stent acts as a splint inside your artery to provide support. The carotid arteries supply blood to the brain and can become blocked by cholesterol buildup (plaque). This buildup decreases blood flow to the brain. The stent remains in place to keep the carotid artery open.  LET [facility] PROVIDER KNOW ABOUT:  Any allergies you have.  All medicines you are taking, including vitamins, herbs, eye drops, creams, and over-the-counter medicines.  Previous problems you or members of your family have had with the use of anesthetics.  Any blood disorders you have.  Previous surgeries you have had.  Medical conditions you have had, such as heart, kidney, or respiratory conditions, and diabetes.  Possibility of pregnancy, if this applies. RISKS AND COMPLICATIONS Generally, this is a safe procedure. However, as with any procedure, problems can occur. Possible problems include:  Stroke.  Infection.  Bleeding at the incision site.  A reaction to the contrast dye, such as rash, hives, or difficulty breathing.  The stented carotid artery can become blocked again.  If you take metformin, you may have to temporarily stop this medicine if you are undergoing a procedure that uses an iodine-based contrast dye. Metformin is cleared from the body by the kidneys. Contrast dye can temporarily affect kidney function. In rare cases, it can permanently affect kidney function. Be sure to talk to your health care provider before this procedure if you take metformin. BEFORE THE PROCEDURE  Ask your health care provider about changing or stopping your regular medicines. This is especially important if you are taking diabetes medicines or blood thinners.  Blood work will be done before your procedure.  Do not eat or drink anything  after midnight on the night before the surgery or as told by your health care provider. Ask your health care provider if it is okay to take any needed medicines with a sip of water.  Do not smoke if you smoke. Smoking will increase the chance of a healing problem after surgery.  If you are allowed to go home after the procedure, plan to have someone take you home and stay with you for 24 hours. Do not  drive yourself home. PROCEDURE  Carotid angiography with stent placement is an X-ray imaging procedure done in a catheterization laboratory.   A numbing medicine (local anesthetic) will be used to numb the groin or arm area where a thin, flexible hollow tube (catheter) will be inserted.  A small incision will be made in the groin or arm where the numbing medicine is given. The catheter will be put into an artery where the incision is made. The catheter will then be guided to the carotid arteries with the help of an X-ray machine (fluoroscope).  When the catheter is in the carotid artery, contrast dye will be injected through the catheter. The dye allows the inside of the carotid artery to show up on X-ray.  Once the narrowed portion of the artery is located, the stent can be placed. The stent will be placed beyond the area of narrowing in the carotid artery using a guidewire with a filter. The filter, also known as a distal protection device, is used to capture plaque that is released during the procedure. It helps to prevent plaque from going to the brain and causing a stroke. A small balloon will be  inflated for a few seconds to dilate the artery. The stent will then be placed in the artery. A second balloon inflation will be done to make sure the stent has completely expanded in the carotid artery. The stent will stay in place permanently. After several weeks, the tissue within the artery will heal and grow around the stent.  After the stent is in place, the catheter will be removed. Bleeding from  the incision site will be controlled by direct pressure. This can be either by a technician holding manual pressure to the area with his or her hand or by a closure device tool. AFTER THE PROCEDURE  You will be on bed rest for a few hours after surgery. You will need to remain flat during this time. The arm or leg that was used for the incision site will need to remain still. Do not bend, flex, or move the arm or leg because this can cause bleeding at the incision site.  The incision site will be watched and checked frequently for bleeding or swelling.  You may need stay in the hospital overnight for observation.  If there are no problems after the procedure, you may be allowed to go home the same day. Someone needs to take you home and stay with you for 24 hours after the procedure.  You will be placed on antiplatelet medicine after this procedure. Be sure you understand the antiplatelet dose and how long you will need to take this medicine.  Do not stop taking this medicine without talking to your health care provider. Suddenly stopping antiplatelet medicine puts you at risk for developing a clot.   This information is not intended to replace advice given to you by your health care provider. Make sure you discuss any questions you have with your health care provider.   Document Released: 10/31/2004 Document Revised: 07/10/2014 Document Reviewed: 11/10/2011 Elsevier Interactive Patient Education [employer]2016 Elsevier Inc.

## 2016-05-05 NOTE — Assessment & Plan Note (Signed)
I have independently reviewed the patient's CT angiogram. The official report is a 65% right ICA stenosis and a 40% left ICA stenosis. As seems to be the typical, the radiologist appeared to have significantly underestimated the degree of stenosis. I would estimate the degree of stenosis significantly more than the clear underestimate of the degree of stenosis on the right. This appears to be much closer to a high-grade stenosis which would correlate with her duplex findings. With these disparate findings between duplex and CT, an angiogram should be performed for full evaluation. If indeed a high-grade stenosis is present, right carotid artery stent would be placed at that time. The patient should remain on aspirin, Plavix, and Crestor. Risks and benefits of carotid artery stenting were discussed and the patient is agreeable to proceed.

## 2016-05-05 NOTE — Progress Notes (Signed)
MRN : [id number]  Bethany Lozano is a 65 y.o. (1951-05-15) female who presents with chief complaint of  Chief Complaint  Patient presents with  . Follow-up  .  History of Present Illness: Patient returns today in follow up of Her carotid disease after her CT angiogram. She has a previous history of amaurosis fugax on the right but no recent symptoms.  I have independently reviewed the patient's CT angiogram. The official report is a 65% right ICA stenosis and a 40% left ICA stenosis. As seems to be the typical, the radiologist appeared to have significantly underestimated the degree of stenosis. I would estimate the degree of stenosis significantly more than the clear underestimate of the degree of stenosis on the right. This appears to be much closer to a high-grade stenosis which would correlate with her duplex findings.   Current Outpatient Prescriptions  Medication Sig Dispense Refill  . aspirin 81 MG tablet Take 81 mg by mouth daily.    . Cholecalciferol (VITAMIN D3) 5000 units CAPS Take by mouth.    . clobetasol (TEMOVATE) 0.05 % GEL   5  . clonazePAM (KLONOPIN) 0.5 MG tablet Take 0.5 mg by mouth daily.   0  . clopidogrel (PLAVIX) 75 MG tablet Take 1 tablet (75 mg total) by mouth daily with breakfast. 30 tablet 3  . co-enzyme Q-10 30 MG capsule Take 100 mg by mouth daily.     . fenofibrate 160 MG tablet Take 160 mg by mouth daily. Reported on 11/08/2015  0  . fluticasone (FLONASE) 50 MCG/ACT nasal spray Place 1 spray into both nostrils as needed.   0  . hydrocortisone cream 1 % Apply 1 application topically 2 (two) times daily. 30 g 1  . mometasone (ELOCON) 0.1 % cream Apply 1 application topically daily.   5  . montelukast (SINGULAIR) 10 MG tablet Take 10 mg by mouth every morning.   0  . oxyCODONE-acetaminophen (PERCOCET/ROXICET) 5-325 MG tablet Take 1-2 tablets by mouth every 4 (four) hours as needed for moderate pain. 30 tablet 0  . PREMARIN 0.3 MG tablet Take 0.3 mg by mouth  daily.   3  . promethazine (PHENERGAN) 25 MG tablet Take 1 tablet (25 mg total) by mouth every 6 (six) hours as needed for nausea or vomiting. 30 tablet 0  . rosuvastatin (CRESTOR) 40 MG tablet Take 40 mg by mouth at bedtime.     Marland Kitchen VIIBRYD 40 MG TABS Take 20 mg by mouth every 3 (three) days.   0   No current facility-administered medications for this visit.     Past Medical History:  Diagnosis Date  . Anxiety   . Depression   . Heart murmur   . Hemorrhoids   . Hyperlipidemia   . Peripheral vascular disease (HCC)   . Sinusitis   . Tobacco use     Past Surgical History:  Procedure Laterality Date  . ABDOMINAL HYSTERECTOMY  1991  . APPENDECTOMY    . CARDIAC CATHETERIZATION N/A 11/08/2015   Procedure: Left Heart Cath and Coronary Angiography;  Surgeon: Laurier Nancy, MD;  Location: [facility];  Service: Cardiovascular;  Laterality: N/A;  . CARDIAC CATHETERIZATION     Mynx placed in right artery after heart catherization  . COLONOSCOPY  2014  . ENDARTERECTOMY Right 11/25/2015   Procedure: ENDARTERECTOMY CAROTID;  Surgeon: Annice Needy, MD;  Location: [facility] ORS;  Service: Vascular;  Laterality: Right;  . FISSURECTOMY  10/22/14  . HEMORRHOIDECTOMY WITH  HEMORRHOID BANDING  1991,06/20/14, 10/22/14  . TUBAL LIGATION  1977    Social History Social History  Substance Use Topics  . Smoking status: Current Every Day Smoker    Packs/day: 1.00    Years: 45.00    Types: Cigarettes  . Smokeless tobacco: Never Used  . Alcohol use 0.0 oz/week      Family History Family History  Problem Relation Age of Onset  . Breast cancer Neg Hx    No bleeding or clotting disorders  Allergies  Allergen Reactions  . Codeine Nausea Only  . Adhesive [Tape] Itching and Rash  . Penicillins Rash     REVIEW OF SYSTEMS (Negative unless checked)  Constitutional: [] Weight loss  [] Fever  [] Chills Cardiac: [] Chest pain   [] Chest pressure   [] Palpitations   [] Shortness of breath when laying  flat   [] Shortness of breath at rest   [] Shortness of breath with exertion. 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   [] Dysphagia   [] Weakness or numbness in arms   [] Weakness or numbness in legs Musculoskeletal:  [] Arthritis   [] Joint swelling   [] Joint pain   [] Low back pain Hematologic:  [] Easy bruising  [] Easy bleeding   [] Hypercoagulable state   [] Anemic   Gastrointestinal:  [] Blood in stool   [] Vomiting blood  [] Gastroesophageal reflux/heartburn   [] Abdominal pain Genitourinary:  [] Chronic kidney disease   [] Difficult urination  [] Frequent urination  [] Burning with urination   [] Hematuria Skin:  [] Rashes   [] Ulcers   [] Wounds Psychological:  [] History of anxiety   []  History of major depression.  Physical Examination  BP (!) 185/80   Pulse 79   Resp 17   Ht 5\' 5"  (1.651 m)   Wt 63 kg (139 lb)   BMI 23.13 kg/m  Gen:  WD/WN, NAD Head: [place]/AT, No temporalis wasting. Ear/Nose/Throat: Hearing grossly intact, nares w/o erythema or drainage, trachea midline Eyes: Conjunctiva clear. Sclera non-icteric Neck: Supple.  No JVD.  Pulmonary:  Good air movement, no use of accessory muscles.  Cardiac: RRR, normal S1, S2 Vascular:  Vessel Right Left  Radial Palpable Palpable  Ulnar Palpable Palpable  Brachial Palpable Palpable  Carotid Palpable, with bruit Palpable, without bruit                       Gastrointestinal: soft, non-tender/non-distended. No guarding/reflex.  Musculoskeletal: M/S 5/5 throughout.  No  deformity or atrophy. No edema. Neurologic: Sensation grossly intact in extremities.  Symmetrical.  Speech is fluent.  Psychiatric: Judgment intact, Mood & affect appropriate for pt's clinical situation. Dermatologic: No rashes or ulcers noted.  No cellulitis or open wounds. Lymph : No Cervical, Axillary, or Inguinal lymphadenopathy.      Labs Recent Results (from the past 2160 hour(s))  I-STAT creatinine     Status: None   Collection Time: 05/02/16 10:49 AM  Result Value Ref Range  Creatinine, Ser 0.70 0.44 - 1.00 mg/dL    Radiology Ct Angio Neck W/cm &/or Wo/cm  Result Date: 05/02/2016 CLINICAL DATA:  Carotid artery stenosis. Prior carotid endarterectomy. Neck pain and right supraclavicular discomfort for 2 weeks. EXAM: CT ANGIOGRAPHY NECK TECHNIQUE: Multidetector CT imaging of the neck was performed using the standard protocol during bolus administration of intravenous contrast. Multiplanar CT image reconstructions and MIPs were obtained to evaluate the vascular anatomy. Carotid stenosis measurements (when applicable) are obtained utilizing NASCET criteria, using the distal internal carotid diameter as the denominator. CONTRAST:  75 mL Isovue 370 COMPARISON:  None. FINDINGS: Aortic arch: Normal aortic arch branching pattern with mild arch atherosclerosis. Brachiocephalic and subclavian arteries are widely patent. Right carotid system: Prior carotid endarterectomy. Widely patent common carotid artery. There is focal narrowing of the ICA due to intimal hyperplasia approximately 2.2 cm distal to its origin, with the lumen narrowed to 1.2 mm at this level resulting in a 65% stenosis when compared with a distal ICA diameter of 3.4 mm. Left carotid system: Common carotid artery is widely patent. Mixed calcified and noncalcified plaque in the proximal ICA results in 40% stenosis. Vertebral arteries: Patent and codominant without evidence of stenosis or dissection. Skeleton: Moderate disc  degeneration at C5-6 and C6-7. Other neck: No mass. No abnormality identified in the right supraclavicular region. Subcentimeter hypoattenuating right thyroid nodule. Upper chest: Mild biapical lung scarring. Small calcified left upper lobe granuloma. IMPRESSION: 1. 65% proximal right ICA stenosis. 2. 40% proximal left ICA stenosis. Electronically Signed   By: Sebastian AcheAllen  Grady M.D.   On: 05/02/2016 12:55     Assessment/Plan  Hyperlipidemia lipid control important in reducing the progression of atherosclerotic disease. Continue statin therapy   Carotid stenosis I have independently reviewed the patient's CT angiogram. The official report is a 65% right ICA stenosis and a 40% left ICA stenosis. As seems to be the typical, the radiologist appeared to have significantly underestimated the degree of stenosis. I would estimate the degree of stenosis significantly more than the clear underestimate of the degree of stenosis on the right. This appears to be much closer to a high-grade stenosis which would correlate with her duplex findings. With these disparate findings between duplex and CT, an angiogram should be performed for full evaluation. If indeed a high-grade stenosis is present, right carotid artery stent would be placed at that time. The patient should remain on aspirin, Plavix, and Crestor. Risks and benefits of carotid artery stenting were discussed and the patient is agreeable to proceed.    Festus BarrenJason Amarilys Lyles, MD  05/05/2016 9:55 AM    This note was created with Dragon medical transcription system.  Any errors from dictation are purely unintentional

## 2016-05-08 ENCOUNTER — Other Ambulatory Visit (INDEPENDENT_AMBULATORY_CARE_PROVIDER_SITE_OTHER): Payer: Self-pay | Admitting: Vascular Surgery

## 2016-05-09 ENCOUNTER — Telehealth (INDEPENDENT_AMBULATORY_CARE_PROVIDER_SITE_OTHER): Payer: Self-pay

## 2016-05-09 NOTE — Telephone Encounter (Signed)
Patient called wanting Dr. Wyn Lozano to know she had 2 headaches a few weeks before and one last night and has been feeling nauseous also.

## 2016-05-12 ENCOUNTER — Other Ambulatory Visit
Admission: RE | Admit: 2016-05-12 | Discharge: 2016-05-12 | Disposition: A | Discharge status: Home / Self Care | Payer: BLUE CROSS/BLUE SHIELD | Source: Ambulatory Visit | Attending: Vascular Surgery | Admitting: Vascular Surgery

## 2016-05-12 DIAGNOSIS — I6523 Occlusion and stenosis of bilateral carotid arteries: Secondary | ICD-10-CM | POA: Diagnosis present

## 2016-05-12 LAB — CREATININE, SERUM: Creatinine, Ser: 0.67 mg/dL (ref 0.44–1.00)

## 2016-05-12 LAB — BUN: BUN: 10 mg/dL (ref 6–20)

## 2016-05-15 ENCOUNTER — Encounter
Admission: RE | Disposition: A | Discharge status: Home / Self Care | Payer: Self-pay | Source: Ambulatory Visit | Attending: Vascular Surgery

## 2016-05-15 ENCOUNTER — Inpatient Hospital Stay
Admission: RE | Admit: 2016-05-15 | Discharge: 2016-05-16 | DRG: 036 | Disposition: A | Discharge status: Home / Self Care | Payer: BLUE CROSS/BLUE SHIELD | Source: Ambulatory Visit | Attending: Vascular Surgery | Admitting: Vascular Surgery

## 2016-05-15 ENCOUNTER — Encounter: Payer: Self-pay | Admitting: *Deleted

## 2016-05-15 DIAGNOSIS — F17210 Nicotine dependence, cigarettes, uncomplicated: Secondary | ICD-10-CM | POA: Diagnosis present

## 2016-05-15 DIAGNOSIS — Z880 Allergy status to penicillin: Secondary | ICD-10-CM | POA: Diagnosis not present

## 2016-05-15 DIAGNOSIS — Z7982 Long term (current) use of aspirin: Secondary | ICD-10-CM

## 2016-05-15 DIAGNOSIS — Z7902 Long term (current) use of antithrombotics/antiplatelets: Secondary | ICD-10-CM

## 2016-05-15 DIAGNOSIS — Z79890 Hormone replacement therapy (postmenopausal): Secondary | ICD-10-CM

## 2016-05-15 DIAGNOSIS — Z79899 Other long term (current) drug therapy: Secondary | ICD-10-CM

## 2016-05-15 DIAGNOSIS — F329 Major depressive disorder, single episode, unspecified: Secondary | ICD-10-CM | POA: Diagnosis present

## 2016-05-15 DIAGNOSIS — Z885 Allergy status to narcotic agent status: Secondary | ICD-10-CM

## 2016-05-15 DIAGNOSIS — Z888 Allergy status to other drugs, medicaments and biological substances status: Secondary | ICD-10-CM | POA: Diagnosis not present

## 2016-05-15 DIAGNOSIS — I6521 Occlusion and stenosis of right carotid artery: Principal | ICD-10-CM | POA: Diagnosis present

## 2016-05-15 DIAGNOSIS — Z23 Encounter for immunization: Secondary | ICD-10-CM | POA: Diagnosis not present

## 2016-05-15 DIAGNOSIS — E785 Hyperlipidemia, unspecified: Secondary | ICD-10-CM | POA: Diagnosis present

## 2016-05-15 DIAGNOSIS — I739 Peripheral vascular disease, unspecified: Secondary | ICD-10-CM | POA: Diagnosis present

## 2016-05-15 HISTORY — PX: PERIPHERAL VASCULAR CATHETERIZATION: SHX172C

## 2016-05-15 HISTORY — PX: CAROTID STENT INSERTION: SHX5766

## 2016-05-15 LAB — CREATININE, SERUM
Creatinine, Ser: 0.55 mg/dL (ref 0.44–1.00)
GFR calc Af Amer: >60 mL/min (ref >60)
GFR calc non Af Amer: >60 mL/min (ref >60)

## 2016-05-15 LAB — MRSA PCR SCREENING: MRSA by PCR: NEGATIVE

## 2016-05-15 LAB — BUN: BUN: 10 mg/dL (ref 6–20)

## 2016-05-15 SURGERY — CAROTID PTA/STENT INTERVENTION
Anesthesia: Moderate Sedation | Laterality: Right

## 2016-05-15 MED ORDER — CLOPIDOGREL BISULFATE 75 MG PO TABS
75.0000 mg | ORAL_TABLET | Freq: Every day | ORAL | Status: DC
  Administered 2016-05-16: 75 mg via ORAL
  Filled 2016-05-15: qty 1

## 2016-05-15 MED ORDER — LABETALOL HCL 5 MG/ML IV SOLN: 10.0000 mg | INTRAVENOUS | Status: DC | PRN

## 2016-05-15 MED ORDER — PNEUMOCOCCAL VAC POLYVALENT 25 MCG/0.5ML IJ INJ
0.5000 mL | INJECTION | INTRAMUSCULAR | Status: AC
  Administered 2016-05-16: 0.5 mL via INTRAMUSCULAR
  Filled 2016-05-15: qty 0.5

## 2016-05-15 MED ORDER — LORATADINE 10 MG PO TABS: 10.0000 mg | ORAL_TABLET | Freq: Every day | ORAL | Status: DC | PRN

## 2016-05-15 MED ORDER — HEPARIN SODIUM (PORCINE) 1000 UNIT/ML IJ SOLN
INTRAMUSCULAR | Status: DC | PRN
  Administered 2016-05-15: 2500 [IU] via INTRAVENOUS
  Administered 2016-05-15: 6000 [IU] via INTRAVENOUS

## 2016-05-15 MED ORDER — FENTANYL CITRATE (PF) 100 MCG/2ML IJ SOLN
INTRAMUSCULAR | Status: AC
  Filled 2016-05-15: qty 4

## 2016-05-15 MED ORDER — PHENOL 1.4 % MT LIQD
1.0000 | OROMUCOSAL | Status: DC | PRN
  Filled 2016-05-15: qty 177

## 2016-05-15 MED ORDER — ATROPINE SULFATE 0.4 MG/ML IJ SOLN
INTRAMUSCULAR | Status: DC | PRN
  Administered 2016-05-15: 0.4 mg via INTRAVENOUS

## 2016-05-15 MED ORDER — ONDANSETRON HCL 4 MG/2ML IJ SOLN: 4.0000 mg | Freq: Four times a day (QID) | INTRAMUSCULAR | Status: DC | PRN

## 2016-05-15 MED ORDER — MONTELUKAST SODIUM 10 MG PO TABS
10.0000 mg | ORAL_TABLET | Freq: Every day | ORAL | Status: DC
  Administered 2016-05-16: 10 mg via ORAL
  Filled 2016-05-15: qty 1

## 2016-05-15 MED ORDER — FAMOTIDINE IN NACL 20-0.9 MG/50ML-% IV SOLN
20.0000 mg | Freq: Two times a day (BID) | INTRAVENOUS | Status: DC
  Administered 2016-05-15 (×2): 20 mg via INTRAVENOUS
  Filled 2016-05-15 (×2): qty 50

## 2016-05-15 MED ORDER — VITAMIN D 1000 UNITS PO TABS
5000.0000 [IU] | ORAL_TABLET | Freq: Every day | ORAL | Status: DC
  Administered 2016-05-16: 5000 [IU] via ORAL
  Filled 2016-05-15: qty 5

## 2016-05-15 MED ORDER — HEPARIN (PORCINE) IN NACL 2-0.9 UNIT/ML-% IJ SOLN
INTRAMUSCULAR | Status: AC
  Filled 2016-05-15: qty 1000

## 2016-05-15 MED ORDER — HEPARIN SODIUM (PORCINE) 1000 UNIT/ML IJ SOLN
INTRAMUSCULAR | Status: AC
  Filled 2016-05-15: qty 1

## 2016-05-15 MED ORDER — POTASSIUM CHLORIDE CRYS ER 20 MEQ PO TBCR: 20.0000 meq | EXTENDED_RELEASE_TABLET | Freq: Every day | ORAL | Status: DC | PRN

## 2016-05-15 MED ORDER — ASPIRIN EC 81 MG PO TBEC
81.0000 mg | DELAYED_RELEASE_TABLET | Freq: Every evening | ORAL | Status: DC
  Administered 2016-05-15: 81 mg via ORAL
  Filled 2016-05-15: qty 1

## 2016-05-15 MED ORDER — VILAZODONE HCL 40 MG PO TABS: 20.0000 mg | ORAL_TABLET | ORAL | Status: DC

## 2016-05-15 MED ORDER — ATROPINE SULFATE 1 MG/10ML IJ SOSY
PREFILLED_SYRINGE | INTRAMUSCULAR | Status: AC
  Filled 2016-05-15: qty 10

## 2016-05-15 MED ORDER — DOPAMINE-DEXTROSE 3.2-5 MG/ML-% IV SOLN: 3.0000 ug/kg/min | INTRAVENOUS | Status: DC

## 2016-05-15 MED ORDER — METOPROLOL TARTRATE 5 MG/5ML IV SOLN: 2.0000 mg | INTRAVENOUS | Status: DC | PRN

## 2016-05-15 MED ORDER — MIDAZOLAM HCL 5 MG/5ML IJ SOLN
INTRAMUSCULAR | Status: AC
  Filled 2016-05-15: qty 5

## 2016-05-15 MED ORDER — SODIUM CHLORIDE 0.9 % IV SOLN: INTRAVENOUS | Status: DC

## 2016-05-15 MED ORDER — MAGNESIUM SULFATE 2 GM/50ML IV SOLN
2.0000 g | Freq: Every day | INTRAVENOUS | Status: DC | PRN
  Filled 2016-05-15: qty 50

## 2016-05-15 MED ORDER — METHYLPREDNISOLONE SODIUM SUCC 125 MG IJ SOLR
125.0000 mg | INTRAMUSCULAR | Status: DC | PRN
  Administered 2016-05-15: 125 mg via INTRAVENOUS

## 2016-05-15 MED ORDER — METHYLPREDNISOLONE SODIUM SUCC 125 MG IJ SOLR: 125.0000 mg | Freq: Once | INTRAMUSCULAR | Status: DC

## 2016-05-15 MED ORDER — ACETAMINOPHEN 650 MG RE SUPP: 325.0000 mg | RECTAL | Status: DC | PRN

## 2016-05-15 MED ORDER — ACETAMINOPHEN 325 MG PO TABS
325.0000 mg | ORAL_TABLET | ORAL | Status: DC | PRN
  Administered 2016-05-15: 650 mg via ORAL
  Filled 2016-05-15: qty 2

## 2016-05-15 MED ORDER — MIDAZOLAM HCL 2 MG/2ML IJ SOLN
INTRAMUSCULAR | Status: DC | PRN
  Administered 2016-05-15 (×4): 1 mg via INTRAVENOUS

## 2016-05-15 MED ORDER — HYDROMORPHONE HCL 1 MG/ML IJ SOLN: 1.0000 mg | Freq: Once | INTRAMUSCULAR | Status: DC

## 2016-05-15 MED ORDER — ESTROGENS CONJUGATED 0.3 MG PO TABS
0.3000 mg | ORAL_TABLET | Freq: Every evening | ORAL | Status: DC
  Administered 2016-05-15: 0.3 mg via ORAL
  Filled 2016-05-15 (×3): qty 1

## 2016-05-15 MED ORDER — ACETAMINOPHEN 500 MG PO TABS: 500.0000 mg | ORAL_TABLET | Freq: Four times a day (QID) | ORAL | Status: DC | PRN

## 2016-05-15 MED ORDER — MORPHINE SULFATE (PF) 4 MG/ML IV SOLN: 2.0000 mg | INTRAVENOUS | Status: DC | PRN

## 2016-05-15 MED ORDER — IOPAMIDOL (ISOVUE-300) INJECTION 61%
INTRAVENOUS | Status: DC | PRN
  Administered 2016-05-15: 60 mL via INTRA_ARTERIAL

## 2016-05-15 MED ORDER — METHYLPREDNISOLONE SODIUM SUCC 125 MG IJ SOLR
INTRAMUSCULAR | Status: AC
  Filled 2016-05-15: qty 2

## 2016-05-15 MED ORDER — FENTANYL CITRATE (PF) 100 MCG/2ML IJ SOLN
INTRAMUSCULAR | Status: DC | PRN
  Administered 2016-05-15: 25 ug via INTRAVENOUS
  Administered 2016-05-15: 50 ug via INTRAVENOUS
  Administered 2016-05-15: 25 ug via INTRAVENOUS

## 2016-05-15 MED ORDER — TRAMADOL HCL 50 MG PO TABS: 50.0000 mg | ORAL_TABLET | Freq: Four times a day (QID) | ORAL | Status: DC | PRN

## 2016-05-15 MED ORDER — VANCOMYCIN HCL IN DEXTROSE 1-5 GM/200ML-% IV SOLN: 1000.0000 mg | Freq: Two times a day (BID) | INTRAVENOUS | Status: DC

## 2016-05-15 MED ORDER — DIPHENHYDRAMINE HCL 50 MG/ML IJ SOLN
INTRAMUSCULAR | Status: AC
  Filled 2016-05-15: qty 1

## 2016-05-15 MED ORDER — VANCOMYCIN HCL IN DEXTROSE 750-5 MG/150ML-% IV SOLN
750.0000 mg | Freq: Two times a day (BID) | INTRAVENOUS | Status: AC
  Administered 2016-05-15 – 2016-05-16 (×2): 750 mg via INTRAVENOUS
  Filled 2016-05-15 (×2): qty 150

## 2016-05-15 MED ORDER — CLINDAMYCIN PHOSPHATE 300 MG/50ML IV SOLN
300.0000 mg | Freq: Once | INTRAVENOUS | Status: AC
  Administered 2016-05-15: 300 mg via INTRAVENOUS

## 2016-05-15 MED ORDER — CLINDAMYCIN PHOSPHATE 300 MG/50ML IV SOLN
INTRAVENOUS | Status: AC
  Filled 2016-05-15: qty 50

## 2016-05-15 MED ORDER — FLUTICASONE PROPIONATE 50 MCG/ACT NA SUSP
1.0000 | Freq: Every evening | NASAL | Status: DC
  Administered 2016-05-15: 1 via NASAL
  Filled 2016-05-15: qty 16

## 2016-05-15 MED ORDER — GUAIFENESIN-DM 100-10 MG/5ML PO SYRP: 15.0000 mL | ORAL_SOLUTION | ORAL | Status: DC | PRN

## 2016-05-15 MED ORDER — VILAZODONE HCL 20 MG PO TABS: 20.0000 mg | ORAL_TABLET | ORAL | Status: DC

## 2016-05-15 MED ORDER — ALUM & MAG HYDROXIDE-SIMETH 200-200-20 MG/5ML PO SUSP: 15.0000 mL | ORAL | Status: DC | PRN

## 2016-05-15 MED ORDER — PHENYLEPHRINE HCL 10 MG/ML IJ SOLN
INTRAMUSCULAR | Status: AC
  Filled 2016-05-15: qty 2

## 2016-05-15 MED ORDER — CLONAZEPAM 0.5 MG PO TABS
0.5000 mg | ORAL_TABLET | Freq: Two times a day (BID) | ORAL | Status: DC
  Administered 2016-05-15 – 2016-05-16 (×3): 0.5 mg via ORAL
  Filled 2016-05-15 (×3): qty 1

## 2016-05-15 MED ORDER — DIPHENHYDRAMINE HCL 50 MG/ML IJ SOLN
INTRAMUSCULAR | Status: DC | PRN
  Administered 2016-05-15: 50 mg via INTRAVENOUS

## 2016-05-15 MED ORDER — FAMOTIDINE 20 MG PO TABS: 40.0000 mg | ORAL_TABLET | ORAL | Status: DC | PRN

## 2016-05-15 MED ORDER — SODIUM CHLORIDE 0.9 % IV SOLN
INTRAVENOUS | Status: DC
  Administered 2016-05-15: 14:00:00 via INTRAVENOUS

## 2016-05-15 MED ORDER — LIDOCAINE-EPINEPHRINE (PF) 1 %-1:200000 IJ SOLN
INTRAMUSCULAR | Status: AC
  Filled 2016-05-15: qty 30

## 2016-05-15 MED ORDER — DOPAMINE-DEXTROSE 3.2-5 MG/ML-% IV SOLN
INTRAVENOUS | Status: AC
  Filled 2016-05-15: qty 250

## 2016-05-15 MED ORDER — SODIUM CHLORIDE 0.9 % IV SOLN: 500.0000 mL | Freq: Once | INTRAVENOUS | Status: DC | PRN

## 2016-05-15 MED ORDER — ROSUVASTATIN CALCIUM 20 MG PO TABS
40.0000 mg | ORAL_TABLET | Freq: Every day | ORAL | Status: DC
  Administered 2016-05-15: 40 mg via ORAL
  Filled 2016-05-15: qty 2

## 2016-05-15 SURGICAL SUPPLY — 21 items
BALLN LUTONIX DCB 5X40X130 (BALLOONS) ×3
BALLOON LUTONIX DCB 5X40X130 (BALLOONS) ×1 IMPLANT
CATH ANGIO 5F 100CM .035 PIG (CATHETERS) ×3 IMPLANT
CATH H1 100CM (CATHETERS) ×3 IMPLANT
DEVICE EMBOSHIELD NAV6 4.0-7.0 (WIRE) ×3 IMPLANT
DEVICE PRESTO INFLATION (MISCELLANEOUS) ×3 IMPLANT
DEVICE STARCLOSE SE CLOSURE (Vascular Products) ×3 IMPLANT
GLIDEWIRE ANGLED SS 035X260CM (WIRE) ×3 IMPLANT
GUIDEWIRE AMPLATZ SS .035X260 (WIRE) ×3 IMPLANT
IV NS 1000ML (IV SOLUTION) ×4
IV NS 1000ML BAXH (IV SOLUTION) ×2 IMPLANT
KIT CAROTID MANIFOLD (MISCELLANEOUS) ×6 IMPLANT
PACK ANGIOGRAPHY (CUSTOM PROCEDURE TRAY) ×3 IMPLANT
SHEATH BRITE TIP 6FRX11 (SHEATH) ×3 IMPLANT
SHEATH SHUTTLE SELECT 6F (SHEATH) ×3 IMPLANT
STENT XACT CAR 8-6X40X136 (Stent) ×3 IMPLANT
SYR MEDRAD MARK V 150ML (SYRINGE) ×3 IMPLANT
TOWEL OR 17X26 4PK STRL BLUE (TOWEL DISPOSABLE) ×3 IMPLANT
TUBING CONTRAST HIGH PRESS 72 (TUBING) ×3 IMPLANT
WIRE BAREWIRE WORK .014X315CM (WIRE) ×3 IMPLANT
WIRE J 3MM .035X145CM (WIRE) ×3 IMPLANT

## 2016-05-15 NOTE — H&P (Signed)
[facility] History & Physical Update  The patient was interviewed and re-examined.  The patient's previous History and Physical has been reviewed and is unchanged.  There is no change in the plan of care. We plan to proceed with the scheduled procedure.  Kimmarie Pascale, MD  05/15/2016, 8:07 AM    

## 2016-05-15 NOTE — Progress Notes (Signed)
Pharmacy Antibiotic Note  Bethany Lozano is a 65 y.o. female admitted on 05/15/2016 for carotid artery stenting.  Pharmacy has been consulted for antibiotic dosing for surgical prophylaxis.  Plan: Will change vancomycin to 750 mg iv q 12 hours for 2 doses.   Height: 5\' 5"  (165.1 cm) Weight: 139 lb (63 kg) IBW/kg (Calculated) : 57  Temp (24hrs), Avg:98.2 F (36.8 C), Min:98.2 F (36.8 C), Max:98.2 F (36.8 C)   Recent Labs Lab 05/12/16 1259  CREATININE 0.67    Estimated Creatinine Clearance: 64.8 mL/min (by C-G formula based on SCr of 0.67 mg/dL).    Allergies  Allergen Reactions  . Codeine Nausea Only  . Adhesive [Tape] Itching and Rash  . Penicillins Rash    Has patient had a PCN reaction causing immediate rash, facial/tongue/throat swelling, SOB or lightheadedness with hypotension: no Has patient had a PCN reaction causing severe rash involving mucus membranes or skin necrosis: no Has patient had a PCN reaction that required hospitalization {no Has patient had a PCN reaction occurring within the last 10 years: no If all of the above answers are "NO", then may proceed with Cephalosporin use.     Thank you for allowing pharmacy to be a part of this patient's care.  Bethany Lozano, Bethany Lozano 05/15/2016 1:50 PM

## 2016-05-15 NOTE — Op Note (Signed)
OPERATIVE NOTE DATE: 05/15/2016  PROCEDURE: 1.  ultrasound guidance for vascular access right femoral artery 2.  Placement of a 8-6 Exact stent with the use of the NAV-6 embolic protection device in the right internal carotid artery  PRE-OPERATIVE DIAGNOSIS: 1. Recurrent right carotid artery stenosis. 2. Previous amaurosis fugax  POST-OPERATIVE DIAGNOSIS:  Same as above  SURGEON: Festus BarrenJason Dew, MD  ASSISTANT(S):  none  ANESTHESIA: local/MCS  ESTIMATED BLOOD LOSS:  50 cc  CONTRAST: 60 cc  FLUORO TIME: 7.9 minutes  MODERATE CONSCIOUS SEDATION TIME:  Approximately 30 minutes using 4 mg of Versed and 100 mcg of Fentanyl  FINDING(S): 1.   80-85% right internal carotid artery stenosis  SPECIMEN(S):   none  INDICATIONS:   Patient is a 65 year old female who presents with  recurrent right carotid artery stenosis after previous endarterectomy. She has a previous history of amaurosis fugax.  The patient has recurrent stenosis and carotid artery stenting was felt to be preferred to endarterectomy for that reason.  Risks and benefits were discussed and informed consent was obtained.   DESCRIPTION: After obtaining full informed written consent, the patient was brought back to the vascular suite and placed supine upon the table.  The patient received IV antibiotics prior to induction.  Moderate conscious sedation was administered during a face to face encounter with the patient throughout the procedure with my supervision of the RN administering medicines and monitoring the patients vital signs and mental status throughout from the start of the procedure until the patient was taken to the recovery room.  After obtaining adequate anesthesia, the patient was prepped and draped in the standard fashion.   The right femoral artery was visualized with ultrasound and found to be widely patent. It was then accessed under direct ultrasound guidance without difficulty with a Seldinger needle. A  permanent image was recorded. A J-wire was placed and we then placed a 6 French sheath. The patient was then heparinized and a total of 8500 units of intravenous heparin were given and an ACT was checked to confirm successful anticoagulation. A pigtail catheter was then placed into the ascending aorta. This showed bovine configuration of the great vessels with a common origin of the right innominate and left common carotid arteries. There is also an early bifurcation of the innominate artery to the right subclavian artery and right common carotid arteries that would make access to the right common carotid artery and tedious. I then selectively cannulated the right innominate artery without difficulty with a headhunter catheter and advanced into the mid right common carotid artery with a mild amount of difficulty as the wire and catheter 1 to track out the subclavian artery initially.  Cervical and cerebral carotid angiography was then performed. There was a normal middle cerebral artery with essentially no anterior cerebral artery filling on intracranial images. The carotid bifurcation demonstrated an 80-85% stenosis distal to the endarterectomy site likely from intimal hyperplasia from the surgical clamp at the time of her original surgery. The endarterectomy was widely patent.  I then advanced into the external carotid artery with a Glidewire and the headhunter catheter and then exchanged for the Amplatz Super Stiff wire. Over the Amplatz Super Stiff wire, a 6 [place] shuttle sheath was placed into the mid common carotid artery. I then used the NAV-6  Embolic protection device and crossed the lesion and parked this in the distal internal carotid artery at the base of the skull.  I then selected a 8mm diameter proximal 6  mm diameter distal 4 cm long Exact stent. This was deployed across the lesion encompassing it in its entirety. A 5 mm diameter by 4 cm length Lutonix drug-coated length balloon was used to post  dilate the stent to try to reduce recurrent stenosis and intimal hyperplasia. Only about a 10-15 % residual stenosis was present after angioplasty. Completion angiogram showed similar intracranial filling with brisk middle cerebral artery flow and no real anterior cerebral artery flow but the patient remained neurologically intact throughout the procedure. At this point I elected to terminate the procedure. The sheath was removed and StarClose closure device was deployed in the left femoral artery with excellent hemostatic result. The patient was taken to the recovery room in stable condition having tolerated the procedure well.  COMPLICATIONS: none  CONDITION: stable  Festus BarrenJason Dew 05/15/2016 11:15 AM   This note was created with Dragon Medical transcription system. Any errors in dictation are purely unintentional.

## 2016-05-15 NOTE — Progress Notes (Signed)
[facility]  Daily Progress Note   Subjective  - Day of Surgery  No events. No complaints.   Objective Vitals:   05/15/16 1245 05/15/16 1302 05/15/16 1320 05/15/16 1334  BP: 116/64 115/63 (!) 143/76   Pulse: 68 66 66   Resp: 16 13 12    Temp:   98.2 F (36.8 C)   TempSrc:   Oral   SpO2: 94% 94% 97% 97%  Weight:      Height:        Intake/Output Summary (Last 24 hours) at 05/15/16 1610 Last data filed at 05/15/16 1454  Gross per 24 hour  Intake               50 ml  Output                0 ml  Net               50 ml    PULM  CTAB CV  RRR VASC  Access site C/D/I Neuro exam normal  Laboratory CBC    Component Value Date/Time   WBC 8.3 11/26/2015 0157   HGB 12.1 11/26/2015 0157   HCT 36.0 11/26/2015 0157   PLT 174 11/26/2015 0157    BMET    Component Value Date/Time   NA 136 11/26/2015 0157   K 3.8 11/26/2015 0157   CL 104 11/26/2015 0157   CO2 22 11/26/2015 0157   GLUCOSE 155 (H) 11/26/2015 0157   BUN 10 05/15/2016 1420   CREATININE 0.55 05/15/2016 1420   CALCIUM 8.8 (L) 11/26/2015 0157   GFRNONAA >60 05/15/2016 1420   GFRAA >60 05/15/2016 1420    Assessment/Planning: POD #0 s/p right carotid stent for high grade recurrent stenosis.   Doing well  Watch in CCU overnight  Avoid hypotension  Check labs in am    Festus BarrenJason Dew  05/15/2016, 4:10 PM

## 2016-05-15 NOTE — Progress Notes (Signed)
Pt in bed AAOx4 . Pt has family at bedside supportive to care.pt request to ambulate. Pt ambulated around nursing station x2 with staff. Tolerated well. Pt back in room sitting up in recliner.

## 2016-05-15 NOTE — Progress Notes (Signed)
Pt procedure is done, tolerated well with vitals stable entire procedure. Pt alert and oriented entire procedure, pupils reactive bilaterally and equal, strong squeeze of toy, plan for pt transfer to recovery for further monitoring, thereafter ccu overnight for further observation.

## 2016-05-15 NOTE — Progress Notes (Signed)
Pt here today for carotid stent placement, alert and oriented, vitals stable, will monitor vitals as well as etco2 during entire procedure.

## 2016-05-15 NOTE — Progress Notes (Addendum)
Chaplain rounded the unit to provide a compassionate presence and support to the patient and family. Patient was sitting up in bed and eating an outside lunch. Jefm PettyChaplain Ashleah Valtierra [phone]

## 2016-05-16 ENCOUNTER — Encounter: Payer: Self-pay | Admitting: Vascular Surgery

## 2016-05-16 LAB — BASIC METABOLIC PANEL
Anion gap: 6 (ref 5–15)
BUN: 14 mg/dL (ref 6–20)
CALCIUM: 9.4 mg/dL (ref 8.9–10.3)
CHLORIDE: 106 mmol/L (ref 101–111)
CO2: 28 mmol/L (ref 22–32)
CREATININE: 0.7 mg/dL (ref 0.44–1.00)
GFR calc non Af Amer: >60 mL/min (ref >60)
GLUCOSE: 141 mg/dL — AB (ref 65–99)
Potassium: 4.3 mmol/L (ref 3.5–5.1)
Sodium: 140 mmol/L (ref 135–145)

## 2016-05-16 LAB — CBC
HEMATOCRIT: 44.5 % (ref 35.0–47.0)
HEMOGLOBIN: 14.9 g/dL (ref 12.0–16.0)
MCH: 31.1 pg (ref 26.0–34.0)
MCHC: 33.5 g/dL (ref 32.0–36.0)
MCV: 93 fL (ref 80.0–100.0)
Platelets: 186 10*3/uL (ref 150–440)
RBC: 4.79 MIL/uL (ref 3.80–5.20)
RDW: 14.6 % — AB (ref 11.5–14.5)
WBC: 15.6 10*3/uL — ABNORMAL HIGH (ref 3.6–11.0)

## 2016-05-16 LAB — GLUCOSE, CAPILLARY: Glucose-Capillary: 118 mg/dL — ABNORMAL HIGH (ref 65–99)

## 2016-05-16 MED ORDER — FAMOTIDINE 20 MG PO TABS
20.0000 mg | ORAL_TABLET | Freq: Two times a day (BID) | ORAL | Status: DC
  Administered 2016-05-16: 20 mg via ORAL
  Filled 2016-05-16: qty 1

## 2016-05-16 NOTE — Progress Notes (Addendum)
Pt has remained alert and oriented with no c/o pain. Lung sounds clear/diminished. RR even and unlabored. NSR on cardiac monitor. Pt has ambulated around the unit multiple times with no distress noted. Pt with orders for discharge. Education materials given regarding smoking cessation and carotid artery disease.

## 2016-05-16 NOTE — Progress Notes (Signed)
CONCERNING: IV to Oral Route Change Policy  RECOMMENDATION: This patient is receiving famotidine by the intravenous route.  Based on criteria approved by the Pharmacy and Therapeutics Committee, the intravenous medication(s) is/are being converted to the equivalent oral dose form(s).   DESCRIPTION: These criteria include:  The patient is eating (either orally or via tube) and/or has been taking other orally administered medications for a least 24 hours  The patient has no evidence of active gastrointestinal bleeding or impaired GI absorption (gastrectomy, short bowel, patient on TNA or NPO).  If you have questions about this conversion, please contact the Pharmacy Department  []   [phone]( [phone] )  [facility] [x]   [phone]( [phone] )  [facility] []   [phone]( [phone]  [facility] []   [phone]( [phone] )  [facility] []   [phone]( [phone] )  [facility]   Montague Corella L, [facility] Pa[facility]PH 05/16/2016 9:18 AM

## 2016-05-16 NOTE — Discharge Summary (Signed)
[facility]    Discharge Summary    Patient ID:  Bethany MorelLaura E Lozano MRN: [id number] DOB/AGE: [birth year] 64 y.o.  Admit date: 05/15/2016 Discharge date: 05/16/2016 Date of Surgery: 05/15/2016 Surgeon: Surgeon(s): Annice NeedyJason S Dew, MD  Admission Diagnosis: RT Carotid Stent     Carotid artery stenosis    ABBOTT rep cc:  M Godley K Bartles  Discharge Diagnoses:  RT Carotid Stent     Carotid artery stenosis    ABBOTT rep cc:  M Godley K Bartles  Secondary Diagnoses: Past Medical History:  Diagnosis Date  . Anxiety   . Depression   . Heart murmur   . Hemorrhoids   . Hyperlipidemia   . Peripheral vascular disease (HCC)   . Sinusitis   . Tobacco use     Procedure(s): Carotid PTA/Stent Intervention  Discharged Condition: good  HPI:  Patient is a 65 year old female who presents with  recurrent right carotid artery stenosis after previous endarterectomy. She has a previous history of amaurosis fugax.  The patient has recurrent stenosis and carotid artery stenting was felt to be preferred to endarterectomy for that reason. On 05/15/16, the patent underwent a ultrasound guidance for vascular access right femoral artery with placement of a 8-6 Exact stent with the use of the NAV-6 embolic protection device in the right internal carotid artery. She tolerated the procedure well and was transferred to the ICU for overnight observation without issue. Her night of surgery was unremarkable. During her short inpatient stay, the patients diet was advanced, her pain was controlled with tylenol and she was ambulating independently.   Hospital Course:  Bethany Lozano is a 65 y.o. female is S/P Right Procedure(s): Carotid PTA/Stent Intervention  Extubated: POD # 0  Physical exam:  A&Ox3, NAD CV: RRR Neck: No swelling or ecchymosis noted. Trachea Midline. Pulm: CTA Bilaterally Abdomen: Soft, Nontender, Nondisteneded Groin: Right - no swelling, drainage or ecchymosis  noted Extremities: warm, non-distended, non-tender  Post-op wounds healing well  Pt. Ambulating, voiding and taking PO diet without difficulty.  Pt pain controlled with PO pain meds.  Labs as below  Complications: none  Consults:  None  Significant Diagnostic Studies: CBC Lab Results  Component Value Date   WBC 15.6 (H) 05/16/2016   HGB 14.9 05/16/2016   HCT 44.5 05/16/2016   MCV 93.0 05/16/2016   PLT 186 05/16/2016   BMET    Component Value Date/Time   NA 140 05/16/2016 0359   K 4.3 05/16/2016 0359   CL 106 05/16/2016 0359   CO2 28 05/16/2016 0359   GLUCOSE 141 (H) 05/16/2016 0359   BUN 14 05/16/2016 0359   CREATININE 0.70 05/16/2016 0359   CALCIUM 9.4 05/16/2016 0359   GFRNONAA >60 05/16/2016 0359   GFRAA >60 05/16/2016 0359   COAG Lab Results  Component Value Date   INR 0.95 11/17/2015   Disposition:  Discharge to :Home    Medication List    TAKE these medications   acetaminophen 500 MG tablet Commonly known as:  TYLENOL Take 500-1,000 mg by mouth every 6 (six) hours as needed for mild pain or headache.   aspirin 81 MG tablet Take 81 mg by mouth every evening.   clonazePAM 0.5 MG tablet Commonly known as:  KLONOPIN Take 0.5 mg by mouth 2 (two) times daily.   clopidogrel 75 MG tablet Commonly known as:  PLAVIX Take 1 tablet (75 mg total) by mouth daily with breakfast.   fluticasone 50 MCG/ACT nasal spray Commonly  known as:  FLONASE Place 1 spray into both nostrils every evening.   loratadine 10 MG tablet Commonly known as:  CLARITIN Take 10 mg by mouth daily as needed for allergies.   mometasone 0.1 % cream Commonly known as:  ELOCON Apply 1 application topically daily as needed.   montelukast 10 MG tablet Commonly known as:  SINGULAIR Take 10 mg by mouth every morning.   PREMARIN 0.3 MG tablet Generic drug:  estrogens (conjugated) Take 0.3 mg by mouth every evening.   rosuvastatin 40 MG tablet Commonly known as:  CRESTOR Take  40 mg by mouth at bedtime.   VIIBRYD 40 MG Tabs Generic drug:  Vilazodone HCl Take 20 mg by mouth every 3 (three) days.   Vitamin D3 5000 units Caps Take 1 capsule by mouth daily.      Verbal and written Discharge instructions given to the patient. Wound care per Discharge AVS Follow-up Information    Festus BarrenJason Dew, MD Follow up in 3 week(s).   Specialties:  Vascular Surgery, Radiology, Interventional Cardiology Why:  Postop Stent Visit with Carotid Duplex Contact information: [address] [place] [address] [phone]          Signed: Tonette LedererKIMBERLY A Sary Bogie, PA-C  05/16/2016, 12:15 PM

## 2016-05-16 NOTE — Progress Notes (Signed)
Patient is alert and oriented x 4. Post cath/sheath right femoral artery dressing clean, dry, intact. Good distal pulses in the right foot. Will continue to monitor patient.

## 2016-05-16 NOTE — Discharge Instructions (Signed)
You may shower as of Thursday.  Keep groin clean and dry.

## 2016-05-19 ENCOUNTER — Telehealth (INDEPENDENT_AMBULATORY_CARE_PROVIDER_SITE_OTHER): Payer: Self-pay

## 2016-05-19 NOTE — Telephone Encounter (Signed)
Patient had a carotid stent placement on Monday, stating she had no pain Monday or Tuesday but now her neck feels tight again, pulse stronger on the side where the stent was placed and has left eye pain but stent is on the right side. What to do ?

## 2016-05-19 NOTE — Telephone Encounter (Signed)
Spoke with the patient and informed her of what Dr. Wyn Quakerew said and she was understanding of this. See previous notes below.

## 2016-05-23 ENCOUNTER — Telehealth (INDEPENDENT_AMBULATORY_CARE_PROVIDER_SITE_OTHER): Payer: Self-pay

## 2016-05-23 NOTE — Telephone Encounter (Signed)
Called in Amoxicillin 500 mg bid #4 tabs take before dental procedure into [facility] pharmacy, a message was left on the patient's cell phone. See below.

## 2016-05-23 NOTE — Telephone Encounter (Signed)
Patient is having her teeth cleaned and wanted to know since she just had a carotid angio should she be on antibiotics for the cleaning ?

## 2016-06-08 ENCOUNTER — Other Ambulatory Visit (INDEPENDENT_AMBULATORY_CARE_PROVIDER_SITE_OTHER): Payer: Self-pay | Admitting: Vascular Surgery

## 2016-06-08 DIAGNOSIS — I6523 Occlusion and stenosis of bilateral carotid arteries: Secondary | ICD-10-CM

## 2016-06-12 ENCOUNTER — Ambulatory Visit (INDEPENDENT_AMBULATORY_CARE_PROVIDER_SITE_OTHER): Payer: BLUE CROSS/BLUE SHIELD | Admitting: Vascular Surgery

## 2016-06-12 ENCOUNTER — Ambulatory Visit (INDEPENDENT_AMBULATORY_CARE_PROVIDER_SITE_OTHER): Payer: BLUE CROSS/BLUE SHIELD

## 2016-06-12 ENCOUNTER — Encounter (INDEPENDENT_AMBULATORY_CARE_PROVIDER_SITE_OTHER): Payer: Self-pay | Admitting: Vascular Surgery

## 2016-06-12 VITALS — BP 141/80 | HR 73 | Resp 16 | Wt 135.0 lb

## 2016-06-12 DIAGNOSIS — I6523 Occlusion and stenosis of bilateral carotid arteries: Secondary | ICD-10-CM | POA: Diagnosis not present

## 2016-06-12 DIAGNOSIS — F17200 Nicotine dependence, unspecified, uncomplicated: Secondary | ICD-10-CM | POA: Diagnosis not present

## 2016-06-12 DIAGNOSIS — I10 Essential (primary) hypertension: Secondary | ICD-10-CM | POA: Diagnosis not present

## 2016-06-12 DIAGNOSIS — E785 Hyperlipidemia, unspecified: Secondary | ICD-10-CM

## 2016-06-12 NOTE — Progress Notes (Signed)
Subjective:    Patient ID: Bethany Lozano, female    DOB: 1950/10/28, 65 y.o.   MRN: [id number] Chief Complaint  Patient presents with  . Follow-up   The patient presents for her first post-procedure follow up. She is s/p a right carotid stent placement for recurrent right carotid artery stenosis on 05/15/16. The patients post-procedure course has been uneventful. She is witout complaint. The patient underwent a bilateral carotid duplex scan which showed a patent right ICA stent and Left ICA stenosis (40-59%). The patient denies experiencing Amaurosis Fugax, TIA like symptoms or focal motor deficits.    Review of Systems  Constitutional: Negative.   HENT: Negative.   Eyes: Negative.   Respiratory: Negative.   Cardiovascular: Negative.   Gastrointestinal: Negative.   Endocrine: Negative.   Genitourinary: Negative.   Musculoskeletal: Negative.   Skin: Negative.   Allergic/Immunologic: Negative.   Neurological: Negative.   Hematological: Negative.   Psychiatric/Behavioral: Negative.       Objective:   Physical Exam  Constitutional: She is oriented to person, place, and time. She appears well-developed and well-nourished.  HENT:  Head: Normocephalic and atraumatic.  Right Ear: External ear normal.  Left Ear: External ear normal.  Eyes: Conjunctivae and EOM are normal. Pupils are equal, round, and reactive to light.  Neck: Normal range of motion.  Cardiovascular: Normal rate, regular rhythm, normal heart sounds and intact distal pulses.   Pulses:      Radial pulses are 2+ on the right side, and 2+ on the left side.       Dorsalis pedis pulses are 2+ on the right side, and 2+ on the left side.       Posterior tibial pulses are 2+ on the right side, and 2+ on the left side.  Bilateral Carotid Bruit noted. Right CEA healed.  Pulmonary/Chest: Effort normal and breath sounds normal.  Abdominal: Soft. Bowel sounds are normal.  Musculoskeletal: Normal range of motion.    Neurological: She is alert and oriented to person, place, and time.  Skin: Skin is warm and dry.  Psychiatric: She has a normal mood and affect. Her behavior is normal. Judgment and thought content normal.   BP (!) 141/80 (BP Location: Left Arm)   Pulse 73   Resp 16   Wt 135 lb (61.2 kg)   BMI 22.47 kg/m   Past Medical History:  Diagnosis Date  . Anxiety   . Depression   . Heart murmur   . Hemorrhoids   . Hyperlipidemia   . Peripheral vascular disease (HCC)   . Sinusitis   . Tobacco use    Social History   Social History  . Marital status: Married    Spouse name: N/A  . Number of children: N/A  . Years of education: N/A   Occupational History  . Not on file.   Social History Main Topics  . Smoking status: Current Every Day Smoker    Packs/day: 1.00    Years: 45.00    Types: Cigarettes  . Smokeless tobacco: Never Used  . Alcohol use 0.0 oz/week  . Drug use: No  . Sexual activity: Not on file   Other Topics Concern  . Not on file   Social History Narrative  . No narrative on file   Past Surgical History:  Procedure Laterality Date  . ABDOMINAL HYSTERECTOMY  1991  . APPENDECTOMY    . CARDIAC CATHETERIZATION N/A 11/08/2015   Procedure: Left Heart Cath and Coronary Angiography;  Surgeon: Wynelle LinkShaukat  Crista LuriaA Khan, MD;  Location: [facility] INVASIVE [facility];  Service: Cardiovascular;  Laterality: N/A;  . CARDIAC CATHETERIZATION     Mynx placed in right artery after heart catherization  . COLONOSCOPY  2014  . ENDARTERECTOMY Right 11/25/2015   Procedure: ENDARTERECTOMY CAROTID;  Surgeon: Annice NeedyJason S Dew, MD;  Location: [facility] ORS;  Service: Vascular;  Laterality: Right;  . FISSURECTOMY  10/22/14  . HEMORRHOIDECTOMY WITH HEMORRHOID BANDING  1991,06/20/14, 10/22/14  . PERIPHERAL VASCULAR CATHETERIZATION Right 05/15/2016   Procedure: Carotid PTA/Stent Intervention;  Surgeon: Annice NeedyJason S Dew, MD;  Location: [facility] INVASIVE CV LAB;  Service: Cardiovascular;  Laterality: Right;  . TUBAL LIGATION   1977   Family History  Problem Relation Age of Onset  . Breast cancer Neg Hx    Allergies  Allergen Reactions  . Codeine Nausea Only  . Adhesive [Tape] Itching and Rash  . Penicillins Rash    Has patient had a PCN reaction causing immediate rash, facial/tongue/throat swelling, SOB or lightheadedness with hypotension: no Has patient had a PCN reaction causing severe rash involving mucus membranes or skin necrosis: no Has patient had a PCN reaction that required hospitalization {no Has patient had a PCN reaction occurring within the last 10 years: no If all of the above answers are "NO", then may proceed with Cephalosporin use.      Assessment & Plan:  The patient presents for her first post-procedure follow up. She is s/p a right carotid stent placement for recurrent right carotid artery stenosis on 05/15/16. The patients post-procedure course has been uneventful. She is witout complaint. The patient underwent a bilateral carotid duplex scan which showed a patent right ICA stent and Left ICA stenosis (40-59%). The patient denies experiencing Amaurosis Fugax, TIA like symptoms or focal motor deficits.   1. Bilateral carotid artery stenosis s/p right CEA s/p Right ICA stent - New Studies reviewed with patient. Patient asymptomatic with improved duplex.  No intervention at this time.  Patient to return in three months for surveillance carotid duplex. Patient to continue medical optimization with ASA and dyslipidemia medication. I have discussed with the patient at length the risk factors for and pathogenesis of atherosclerotic disease and encouraged a healthy diet, regular exercise regimen and blood pressure / glucose control.  Patient was instructed to contact our office in the interim with problems such as arm / leg weakness or numbness, speech / swallowing difficulty or temporary monocular blindness. The patient expresses their understanding.   - VAS US CAROTID; Future  2. Essential  hypertension - Stable Encouraged good control as its slows the progression of atherosclerotic disease  3. Hyperlipidemia, unspecified hyperlipidemia type - Stable Encouraged good control as its slows the progression of atherosclerotic disease  4. Tobacco use disorder - Stable I have discussed (approximately 5 minutes) with the patient the role of tobacco in the pathogenesis of atherosclerosis and its effect on the progression of the disease, impact on the durability of interventions and its limitations on the formation of collateral pathways. I have recommended absolute tobacco cessation. I have discussed various options available for assistance with tobacco cessation including over the counter methods (Nicotine gum, patch and lozenges). We also discussed prescription options (Chantix, Nicotine Inhaler / Nasal Spray). The patient is not interested in pursuing any prescription tobacco cessation options at this time. The patient voices their understanding.   Current Outpatient Prescriptions on File Prior to Visit  Medication Sig Dispense Refill  . acetaminophen (TYLENOL) 500 MG tablet Take 500-1,000 mg by mouth every  6 (six) hours as needed for mild pain or headache.    Marland Kitchen aspirin 81 MG tablet Take 81 mg by mouth every evening.     . Cholecalciferol (VITAMIN D3) 5000 units CAPS Take 1 capsule by mouth daily.     . clonazePAM (KLONOPIN) 0.5 MG tablet Take 0.5 mg by mouth 2 (two) times daily.   0  . clopidogrel (PLAVIX) 75 MG tablet Take 1 tablet (75 mg total) by mouth daily with breakfast. 30 tablet 3  . fluticasone (FLONASE) 50 MCG/ACT nasal spray Place 1 spray into both nostrils every evening.   0  . loratadine (CLARITIN) 10 MG tablet Take 10 mg by mouth daily as needed for allergies.    . mometasone (ELOCON) 0.1 % cream Apply 1 application topically daily as needed.   5  . montelukast (SINGULAIR) 10 MG tablet Take 10 mg by mouth every morning.   0  . PREMARIN 0.3 MG tablet Take 0.3 mg by mouth  every evening.   3  . rosuvastatin (CRESTOR) 40 MG tablet Take 40 mg by mouth at bedtime.     Marland Kitchen VIIBRYD 40 MG TABS Take 20 mg by mouth every 3 (three) days.   0   No current facility-administered medications on file prior to visit.     There are no Patient Instructions on file for this visit. No Follow-up on file.   Gloriann Riede A Elven Laboy, PA-C

## 2016-06-15 ENCOUNTER — Other Ambulatory Visit: Payer: Self-pay | Admitting: Internal Medicine

## 2016-06-15 DIAGNOSIS — Z1231 Encounter for screening mammogram for malignant neoplasm of breast: Secondary | ICD-10-CM

## 2016-07-21 ENCOUNTER — Ambulatory Visit
Admission: RE | Admit: 2016-07-21 | Discharge: 2016-07-21 | Disposition: A | Discharge status: Home / Self Care | Payer: BLUE CROSS/BLUE SHIELD | Source: Ambulatory Visit | Attending: Internal Medicine | Admitting: Internal Medicine

## 2016-07-21 DIAGNOSIS — Z1231 Encounter for screening mammogram for malignant neoplasm of breast: Secondary | ICD-10-CM | POA: Insufficient documentation

## 2016-07-28 ENCOUNTER — Telehealth (INDEPENDENT_AMBULATORY_CARE_PROVIDER_SITE_OTHER): Payer: Self-pay

## 2016-07-28 DIAGNOSIS — I6521 Occlusion and stenosis of right carotid artery: Secondary | ICD-10-CM

## 2016-07-28 NOTE — Telephone Encounter (Signed)
Patient called stating that her neck is starting to stiffen up on her again, and it has a tightness to it like someone is chocking her. She want to know what she should do?

## 2016-07-28 NOTE — Addendum Note (Signed)
Addended by: Annice NeedyEW, JASON S on: 07/28/2016 04:57 PM   Modules accepted: Orders

## 2016-07-28 NOTE — Telephone Encounter (Signed)
Patient called back to check the status. I reviewed the notes and scheduled Carotid US. For 1/31.

## 2016-08-02 ENCOUNTER — Ambulatory Visit (INDEPENDENT_AMBULATORY_CARE_PROVIDER_SITE_OTHER): Payer: BLUE CROSS/BLUE SHIELD | Admitting: Vascular Surgery

## 2016-08-02 ENCOUNTER — Ambulatory Visit (INDEPENDENT_AMBULATORY_CARE_PROVIDER_SITE_OTHER): Payer: BLUE CROSS/BLUE SHIELD

## 2016-08-02 ENCOUNTER — Encounter (INDEPENDENT_AMBULATORY_CARE_PROVIDER_SITE_OTHER): Payer: Self-pay | Admitting: Vascular Surgery

## 2016-08-02 VITALS — BP 133/73 | HR 65 | Resp 16 | Ht 65.0 in | Wt 138.0 lb

## 2016-08-02 DIAGNOSIS — I6521 Occlusion and stenosis of right carotid artery: Secondary | ICD-10-CM

## 2016-08-02 DIAGNOSIS — I6523 Occlusion and stenosis of bilateral carotid arteries: Secondary | ICD-10-CM

## 2016-08-02 DIAGNOSIS — F17200 Nicotine dependence, unspecified, uncomplicated: Secondary | ICD-10-CM | POA: Diagnosis not present

## 2016-08-02 DIAGNOSIS — E785 Hyperlipidemia, unspecified: Secondary | ICD-10-CM

## 2016-08-03 LAB — VAS US CAROTID
LCCAPDIAS: 39 cm/s
LEFT ECA DIAS: 24 cm/s
LICADDIAS: -42 cm/s
LICADSYS: -117 cm/s
LICAPDIAS: -50 cm/s
LICAPSYS: -150 cm/s
Left CCA dist dias: 39 cm/s
Left CCA dist sys: 124 cm/s
Left CCA prox sys: 128 cm/s
RIGHT CCA MID DIAS: 32 cm/s
RIGHT ECA DIAS: 34 cm/s
Right CCA prox dias: 34 cm/s
Right CCA prox sys: 122 cm/s
Right cca dist sys: -119 cm/s

## 2016-09-11 NOTE — Progress Notes (Signed)
Subjective:    Patient ID: Bethany Lozano, female    DOB: Jul 25, 1950, 66 y.o.   MRN: [id number] Chief Complaint  Patient presents with  . Re-evaluation    Neck pain, carotid ultrasound   Patient last seen on 06/12/16 s/p a  right carotid stent placement for recurrent right carotid artery stenosis on 05/15/16. She presents today very anxious complaining of a pain in her neck since her procedure. The patient denies experiencing Amaurosis Fugax, TIA like symptoms or focal motor deficits however is very anxious about this pain. The patient underwent a bilateral carotid duplex scan which showed no change from the previous exam on 04/25/16. Duplex is stable at patent right carotid endarterectomy site with patent stent, 40-59% stenosis of left internal carotid artery.   Review of Systems  Constitutional: Negative.   HENT:       Right Sided Neck Pain  Eyes: Negative.   Respiratory: Negative.   Cardiovascular: Negative.   Gastrointestinal: Negative.   Endocrine: Negative.   Genitourinary: Negative.   Musculoskeletal: Negative.   Skin: Negative.   Allergic/Immunologic: Negative.   Neurological: Negative.   Hematological: Negative.   Psychiatric/Behavioral: Negative.       Objective:   Physical Exam  Constitutional: She is oriented to person, place, and time. She appears well-developed and well-nourished.  HENT:  Head: Normocephalic and atraumatic.  Right Ear: External ear normal.  Left Ear: External ear normal.  Eyes: Conjunctivae and EOM are normal. Pupils are equal, round, and reactive to light.  Neck: Normal range of motion.  Cardiovascular: Normal rate, regular rhythm, normal heart sounds and intact distal pulses.   Pulses:      Radial pulses are 2+ on the right side, and 2+ on the left side.       Dorsalis pedis pulses are 2+ on the right side, and 2+ on the left side.       Posterior tibial pulses are 2+ on the right side, and 2+ on the left side.  Left Carotid Bruit.  No  swelling or edema noted on neck. Neck soft.  Trachea midline.   Pulmonary/Chest: Effort normal and breath sounds normal.  Neurological: She is alert and oriented to person, place, and time.  Skin: Skin is warm and dry.  Psychiatric: She has a normal mood and affect. Her behavior is normal. Judgment and thought content normal.   BP 133/73 (BP Location: Left Arm)   Pulse 65   Resp 16   Ht 5\' 5"  (1.651 m)   Wt 138 lb (62.6 kg)   BMI 22.96 kg/m   Past Medical History:  Diagnosis Date  . Anxiety   . Depression   . Heart murmur   . Hemorrhoids   . Hyperlipidemia   . Peripheral vascular disease (HCC)   . Sinusitis   . Tobacco use    Social History   Social History  . Marital status: Married    Spouse name: N/A  . Number of children: N/A  . Years of education: N/A   Occupational History  . Not on file.   Social History Main Topics  . Smoking status: Current Every Day Smoker    Packs/day: 1.00    Years: 45.00    Types: Cigarettes  . Smokeless tobacco: Never Used  . Alcohol use 0.0 oz/week  . Drug use: No  . Sexual activity: Not on file   Other Topics Concern  . Not on file   Social History Narrative  . No narrative on  file   Past Surgical History:  Procedure Laterality Date  . ABDOMINAL HYSTERECTOMY  1991  . APPENDECTOMY    . CARDIAC CATHETERIZATION N/A 11/08/2015   Procedure: Left Heart Cath and Coronary Angiography;  Surgeon: Laurier NancyShaukat A Khan, MD;  Location: [facility];  Service: Cardiovascular;  Laterality: N/A;  . CARDIAC CATHETERIZATION     Mynx placed in right artery after heart catherization  . COLONOSCOPY  2014  . ENDARTERECTOMY Right 11/25/2015   Procedure: ENDARTERECTOMY CAROTID;  Surgeon: Annice NeedyJason S Dew, MD;  Location: [facility];  Service: Vascular;  Laterality: Right;  . FISSURECTOMY  10/22/14  . HEMORRHOIDECTOMY WITH HEMORRHOID BANDING  1991,06/20/14, 10/22/14  . PERIPHERAL VASCULAR CATHETERIZATION Right 05/15/2016   Procedure: Carotid PTA/Stent  Intervention;  Surgeon: Annice NeedyJason S Dew, MD;  Location: [facility];  Service: Cardiovascular;  Laterality: Right;  . TUBAL LIGATION  1977   Family History  Problem Relation Age of Onset  . Breast cancer Neg Hx    Allergies  Allergen Reactions  . Codeine Nausea Only  . Adhesive [Tape] Itching and Rash  . Penicillins Rash    Has patient had a PCN reaction causing immediate rash, facial/tongue/throat swelling, SOB or lightheadedness with hypotension: no Has patient had a PCN reaction causing severe rash involving mucus membranes or skin necrosis: no Has patient had a PCN reaction that required hospitalization {no Has patient had a PCN reaction occurring within the last 10 years: no If all of the above answers are "NO", then may proceed with Cephalosporin use.      Assessment & Plan:  Patient last seen on 06/12/16 s/p a  right carotid stent placement for recurrent right carotid artery stenosis on 05/15/16. She presents today very anxious complaining of a pain in her neck since her procedure. The patient denies experiencing Amaurosis Fugax, TIA like symptoms or focal motor deficits however is very anxious about this pain. The patient underwent a bilateral carotid duplex scan which showed no change from the previous exam on 04/25/16. Duplex is stable at patent right carotid endarterectomy site with patent stent, 40-59% stenosis of left internal carotid artery.  1. Bilateral carotid artery stenosis - Stable Patient very anxious. Discussed duplex results with patient. No fluid collection noted on duplex. Physical exam unremarkable. If pain persists will order a CT however do not feel there is an active issue. Patient to follow up per her regular schedule.  - VAS US CAROTID; Future  2. Hyperlipidemia, unspecified hyperlipidemia type - Stable Encouraged good control as its slows the progression of atherosclerotic disease  3. Tobacco use disorder - Stable I have discussed  (approximately 5 minutes) with the patient the role of tobacco in the pathogenesis of atherosclerosis and its effect on the progression of the disease, impact on the durability of interventions and its limitations on the formation of collateral pathways. I have recommended absolute tobacco cessation. I have discussed various options available for assistance with tobacco cessation including over the counter methods (Nicotine gum, patch and lozenges). We also discussed prescription options (Chantix, Nicotine Inhaler / Nasal Spray). The patient is not interested in pursuing any prescription tobacco cessation options at this time. The patient voices their understanding.  Current Outpatient Prescriptions on File Prior to Visit  Medication Sig Dispense Refill  . acetaminophen (TYLENOL) 500 MG tablet Take 500-1,000 mg by mouth every 6 (six) hours as needed for mild pain or headache.    Marland Kitchen. aspirin 81 MG tablet Take 81 mg by mouth every evening.     .Marland Kitchen  chlorthalidone (HYGROTON) 25 MG tablet Take 12.5 mg by mouth daily.  0  . Cholecalciferol (VITAMIN D3) 5000 units CAPS Take 1 capsule by mouth daily.     . clonazePAM (KLONOPIN) 0.5 MG tablet Take 0.5 mg by mouth 2 (two) times daily.   0  . clopidogrel (PLAVIX) 75 MG tablet Take 1 tablet (75 mg total) by mouth daily with breakfast. 30 tablet 3  . fluticasone (FLONASE) 50 MCG/ACT nasal spray Place 1 spray into both nostrils every evening.   0  . loratadine (CLARITIN) 10 MG tablet Take 10 mg by mouth daily as needed for allergies.    . mometasone (ELOCON) 0.1 % cream Apply 1 application topically daily as needed.   5  . montelukast (SINGULAIR) 10 MG tablet Take 10 mg by mouth every morning.   0  . PREMARIN 0.3 MG tablet Take 0.3 mg by mouth every evening.   3  . rosuvastatin (CRESTOR) 40 MG tablet Take 40 mg by mouth at bedtime.     Marland Kitchen VIIBRYD 40 MG TABS Take 20 mg by mouth every 3 (three) days.   0   No current facility-administered medications on file prior to  visit.     There are no Patient Instructions on file for this visit. No Follow-up on file.   Jaishawn Witzke A Davontay Watlington, PA-C

## 2016-09-15 ENCOUNTER — Encounter (INDEPENDENT_AMBULATORY_CARE_PROVIDER_SITE_OTHER): Payer: Self-pay | Admitting: Vascular Surgery

## 2016-09-15 ENCOUNTER — Ambulatory Visit (INDEPENDENT_AMBULATORY_CARE_PROVIDER_SITE_OTHER): Payer: BLUE CROSS/BLUE SHIELD

## 2016-09-15 ENCOUNTER — Ambulatory Visit (INDEPENDENT_AMBULATORY_CARE_PROVIDER_SITE_OTHER): Payer: BLUE CROSS/BLUE SHIELD | Admitting: Vascular Surgery

## 2016-09-15 VITALS — BP 155/73 | HR 66 | Resp 17 | Wt 138.0 lb

## 2016-09-15 DIAGNOSIS — I6523 Occlusion and stenosis of bilateral carotid arteries: Secondary | ICD-10-CM

## 2016-09-15 DIAGNOSIS — E785 Hyperlipidemia, unspecified: Secondary | ICD-10-CM

## 2016-09-15 DIAGNOSIS — I70211 Atherosclerosis of native arteries of extremities with intermittent claudication, right leg: Secondary | ICD-10-CM | POA: Diagnosis not present

## 2016-09-15 DIAGNOSIS — F17200 Nicotine dependence, unspecified, uncomplicated: Secondary | ICD-10-CM | POA: Diagnosis not present

## 2016-09-15 DIAGNOSIS — I10 Essential (primary) hypertension: Secondary | ICD-10-CM

## 2016-09-15 DIAGNOSIS — I70219 Atherosclerosis of native arteries of extremities with intermittent claudication, unspecified extremity: Secondary | ICD-10-CM | POA: Insufficient documentation

## 2016-09-15 NOTE — Assessment & Plan Note (Signed)
Her carotid duplex today reveals a widely patent right carotid artery stent without restenosis. Her left carotid artery stenosis remains in the 40-59% range, but on the lower end of that range. This has not progressed from her previous study. She will continue aspirin, Plavix, and Crestor. We will recheck her carotid arteries in 6 months with duplex.

## 2016-09-15 NOTE — Assessment & Plan Note (Signed)
blood pressure control important in reducing the progression of atherosclerotic disease. On appropriate oral medications.  

## 2016-09-15 NOTE — Patient Instructions (Signed)
Peripheral Vascular Disease Peripheral vascular disease (PVD) is a disease of the blood vessels that are not part of your heart and brain. A simple term for PVD is poor circulation. In most cases, PVD narrows the blood vessels that carry blood from your heart to the rest of your body. This can result in a decreased supply of blood to your arms, legs, and internal organs, like your stomach or kidneys. However, it most often affects a person's lower legs and feet. There are two types of PVD.  Organic PVD. This is the more common type. It is caused by damage to the structure of blood vessels.  Functional PVD. This is caused by conditions that make blood vessels contract and tighten (spasm).  Without treatment, PVD tends to get worse over time. PVD can also lead to acute ischemic limb. This is when an arm or limb suddenly has trouble getting enough blood. This is a medical emergency. What are the causes? Each type of PVD has many different causes. The most common cause of PVD is buildup of a fatty material (plaque) inside of your arteries (atherosclerosis). Small amounts of plaque can break off from the walls of the blood vessels and become lodged in a smaller artery. This blocks blood flow and can cause acute ischemic limb. Other common causes of PVD include:  Blood clots that form inside of blood vessels.  Injuries to blood vessels.  Diseases that cause inflammation of blood vessels or cause blood vessel spasms.  Health behaviors and health history that increase your risk of developing PVD.  What increases the risk? You may have a greater risk of PVD if you:  Have a family history of PVD.  Have certain medical conditions, including: ? High cholesterol. ? Diabetes. ? High blood pressure (hypertension). ? Coronary heart disease. ? Past problems with blood clots. ? Past injury, such as burns or a broken bone. These may have damaged blood vessels in your limbs. ? Buerger disease. This is  caused by inflamed blood vessels in your hands and feet. ? Some forms of arthritis. ? Rare birth defects that affect the arteries in your legs.  Use tobacco.  Do not get enough exercise.  Are obese.  Are age 50 or older.  What are the signs or symptoms? PVD may cause many different symptoms. Your symptoms depend on what part of your body is not getting enough blood. Some common signs and symptoms include:  Cramps in your lower legs. This may be a symptom of poor leg circulation (claudication).  Pain and weakness in your legs while you are physically active that goes away when you rest (intermittent claudication).  Leg pain when at rest.  Leg numbness, tingling, or weakness.  Coldness in a leg or foot, especially when compared with the other leg.  Skin or hair changes. These can include: ? Hair loss. ? Shiny skin. ? Pale or bluish skin. ? Thick toenails.  Inability to get or maintain an erection (erectile dysfunction).  People with PVD are more prone to developing ulcers and sores on their toes, feet, or legs. These may take longer than normal to heal. How is this diagnosed? Your health care provider may diagnose PVD from your signs and symptoms. The health care provider will also do a physical exam. You may have tests to find out what is causing your PVD and determine its severity. Tests may include:  Blood pressure recordings from your arms and legs and measurements of the strength of your pulses (  pulse volume recordings).  Imaging studies using sound waves to take pictures of the blood flow through your blood vessels (Doppler ultrasound).  Injecting a dye into your blood vessels before having imaging studies using: ? X-rays (angiogram or arteriogram). ? Computer-generated X-rays (CT angiogram). ? A powerful electromagnetic field and a computer (magnetic resonance angiogram or MRA).  How is this treated? Treatment for PVD depends on the cause of your condition and the  severity of your symptoms. It also depends on your age. Underlying causes need to be treated and controlled. These include long-lasting (chronic) conditions, such as diabetes, high cholesterol, and high blood pressure. You may need to first try making lifestyle changes and taking medicines. Surgery may be needed if these do not work. Lifestyle changes may include:  Quitting smoking.  Exercising regularly.  Following a low-fat, low-cholesterol diet.  Medicines may include:  Blood thinners to prevent blood clots.  Medicines to improve blood flow.  Medicines to improve your blood cholesterol levels.  Surgical procedures may include:  A procedure that uses an inflated balloon to open a blocked artery and improve blood flow (angioplasty).  A procedure to put in a tube (stent) to keep a blocked artery open (stent implant).  Surgery to reroute blood flow around a blocked artery (peripheral bypass surgery).  Surgery to remove dead tissue from an infected wound on the affected limb.  Amputation. This is surgical removal of the affected limb. This may be necessary in cases of acute ischemic limb that are not improved through medical or surgical treatments.  Follow these instructions at home:  Take medicines only as directed by your health care provider.  Do not use any tobacco products, including cigarettes, chewing tobacco, or electronic cigarettes. If you need help quitting, ask your health care provider.  Lose weight if you are overweight, and maintain a healthy weight as directed by your health care provider.  Eat a diet that is low in fat and cholesterol. If you need help, ask your health care provider.  Exercise regularly. Ask your health care provider to suggest some good activities for you.  Use compression stockings or other mechanical devices as directed by your health care provider.  Take good care of your feet. ? Wear comfortable shoes that fit well. ? Check your feet  often for any cuts or sores. Contact a health care provider if:  You have cramps in your legs while walking.  You have leg pain when you are at rest.  You have coldness in a leg or foot.  Your skin changes.  You have erectile dysfunction.  You have cuts or sores on your feet that are not healing. Get help right away if:  Your arm or leg turns cold and blue.  Your arms or legs become red, warm, swollen, painful, or numb.  You have chest pain or trouble breathing.  You suddenly have weakness in your face, arm, or leg.  You become very confused or lose the ability to speak.  You suddenly have a very bad headache or lose your vision. This information is not intended to replace advice given to you by your health care provider. Make sure you discuss any questions you have with your health care provider. Document Released: 07/27/2004 Document Revised: 11/25/2015 Document Reviewed: 11/27/2013 [employer] Interactive Patient Education  2017 [employer]  

## 2016-09-15 NOTE — Progress Notes (Signed)
MRN : [id number]  Bethany Lozano is a 66 y.o. ([birth year]) female who presents with chief complaint of  Chief Complaint  Patient presents with  . Follow-up  .  History of Present Illness: Patient returns in follow-up. She is disappointed that she still feels poorly. She still has some visual symptoms in her left eye as well as headaches and lethargy. She has not had any focal arm or leg numbness or acute weakness, speech or swallowing difficulty, or complete temporary vision loss. She has been complaining of more pain in her right leg with short distances of walking. This has progressed over time and has become much more noticeable over the past 3-4 months. She can only walk about 100-200 feet before having to stop and rest. This is becoming much more disabling. She does not have ulceration or infection. She does continue to smoke. Her carotid duplex today reveals a widely patent right carotid artery stent without restenosis. Her left carotid artery stenosis remains in the 40-59% range, but on the lower end of that range. This has not progressed from her previous study.  Current Outpatient Prescriptions  Medication Sig Dispense Refill  . acetaminophen (TYLENOL) 500 MG tablet Take 500-1,000 mg by mouth every 6 (six) hours as needed for mild pain or headache.    Marland Kitchen aspirin 81 MG tablet Take 81 mg by mouth every evening.     . chlorthalidone (HYGROTON) 25 MG tablet Take 12.5 mg by mouth daily.  0  . Cholecalciferol (VITAMIN D3) 5000 units CAPS Take 1 capsule by mouth daily.     . clonazePAM (KLONOPIN) 0.5 MG tablet Take 0.5 mg by mouth 2 (two) times daily.   0  . clopidogrel (PLAVIX) 75 MG tablet Take 1 tablet (75 mg total) by mouth daily with breakfast. 30 tablet 3  . fluticasone (FLONASE) 50 MCG/ACT nasal spray Place 1 spray into both nostrils every evening.   0  . loratadine (CLARITIN) 10 MG tablet Take 10 mg by mouth daily as needed for allergies.    . mometasone (ELOCON) 0.1 % cream Apply 1  application topically daily as needed.   5  . montelukast (SINGULAIR) 10 MG tablet Take 10 mg by mouth every morning.   0  . PREMARIN 0.3 MG tablet Take 0.3 mg by mouth every evening.   3  . rosuvastatin (CRESTOR) 40 MG tablet Take 40 mg by mouth at bedtime.     Marland Kitchen VIIBRYD 40 MG TABS Take 20 mg by mouth every 3 (three) days.   0   No current facility-administered medications for this visit.     Past Medical History:  Diagnosis Date  . Anxiety   . Depression   . Heart murmur   . Hemorrhoids   . Hyperlipidemia   . Peripheral vascular disease (HCC)   . Sinusitis   . Tobacco use     Past Surgical History:  Procedure Laterality Date  . ABDOMINAL HYSTERECTOMY  1991  . APPENDECTOMY    . CARDIAC CATHETERIZATION N/A 11/08/2015   Procedure: Left Heart Cath and Coronary Angiography;  Surgeon: Laurier Nancy, MD;  Location: [facility];  Service: Cardiovascular;  Laterality: N/A;  . CARDIAC CATHETERIZATION     Mynx placed in right artery after heart catherization  . COLONOSCOPY  2014  . ENDARTERECTOMY Right 11/25/2015   Procedure: ENDARTERECTOMY CAROTID;  Surgeon: Annice Needy, MD;  Location: [facility];  Service: Vascular;  Laterality: Right;  . FISSURECTOMY  10/22/14  .  HEMORRHOIDECTOMY WITH HEMORRHOID BANDING  1991,06/20/14, 10/22/14  . PERIPHERAL VASCULAR CATHETERIZATION Right 05/15/2016   Procedure: Carotid PTA/Stent Intervention;  Surgeon: Annice NeedyJason S Dew, MD;  Location: [facility];  Service: Cardiovascular;  Laterality: Right;  . TUBAL LIGATION  1977    Social History Social History  Substance Use Topics  . Smoking status: Current Every Day Smoker    Packs/day: 1.00    Years: 45.00    Types: Cigarettes  . Smokeless tobacco: Never Used  . Alcohol use 0.0 oz/week    Family History Family History  Problem Relation Age of Onset  . Breast cancer Neg Hx   No bleeding disorders, clotting disorders, or autoimmune diseases, or porphyria  Allergies  Allergen Reactions    . Codeine Nausea Only  . Adhesive [Tape] Itching and Rash  . Penicillins Rash    Has patient had a PCN reaction causing immediate rash, facial/tongue/throat swelling, SOB or lightheadedness with hypotension: no Has patient had a PCN reaction causing severe rash involving mucus membranes or skin necrosis: no Has patient had a PCN reaction that required hospitalization {no Has patient had a PCN reaction occurring within the last 10 years: no If all of the above answers are "NO", then may proceed with Cephalosporin use.     REVIEW OF SYSTEMS (Negative unless checked)  Constitutional: [] Weight loss  [] Fever  [] Chills Cardiac: [] Chest pain   [] Chest pressure   [] Palpitations   [] Shortness of breath when laying flat   [] Shortness of breath at rest   [] Shortness of breath with exertion. Vascular:  [x] Pain in legs with walking   [] Pain in legs at rest   [] Pain in legs when laying flat   [x] Claudication   [] Pain in feet when walking  [] Pain in feet at rest  [] Pain in feet when laying flat   [] History of DVT   [] Phlebitis   [] Swelling in legs   [] Varicose veins   [] Non-healing ulcers Pulmonary:   [] Uses home oxygen   [] Productive cough   [] Hemoptysis   [] Wheeze  [] COPD   [] Asthma Neurologic:  [x] Dizziness  [] Blackouts   [] Seizures   [] History of stroke   [] History of TIA  [] Aphasia   [x] Temporary blindness   [] Dysphagia   [] Weakness or numbness in arms   [x] Weakness or numbness in legs Musculoskeletal:  [] Arthritis   [] Joint swelling   [] Joint pain   [] Low back pain Hematologic:  [] Easy bruising  [] Easy bleeding   [] Hypercoagulable state   [] Anemic  [] Hepatitis Gastrointestinal:  [] Blood in stool   [] Vomiting blood  [] Gastroesophageal reflux/heartburn   [] Difficulty swallowing. Genitourinary:  [] Chronic kidney disease   [] Difficult urination  [] Frequent urination  [] Burning with urination   [] Blood in urine Skin:  [] Rashes   [] Ulcers   [] Wounds Psychological:  [x] History of anxiety   []  History of  major depression.  Physical Examination  Vitals:   09/15/16 1203 09/15/16 1204  BP: (!) 154/81 (!) 155/73  Pulse: 66   Resp: 17   Weight: 138 lb (62.6 kg)    Body mass index is 22.96 kg/m. Gen:  WD/WN, NAD Head: [place]/AT, No temporalis wasting. Ear/Nose/Throat: Hearing grossly intact, nares w/o erythema or drainage, trachea midline Eyes: Conjunctiva clear. Sclera non-icteric Neck: CEA incision well healed.  No bruit or JVD.  Pulmonary:  Good air movement, equal and clear to auscultation bilaterally.  Cardiac: RRR, normal S1, S2 Vascular:  Vessel Right Left  Radial Palpable Palpable  Ulnar Palpable Palpable  Brachial Palpable Palpable  Carotid Palpable, without  bruit Palpable, without bruit  Aorta Not palpable N/A  Femoral Palpable Palpable  Popliteal 1+ Palpable Palpable  PT 1+ Palpable Palpable  DP Trace Palpable 1+ Palpable   Gastrointestinal: soft, non-tender/non-distended.  Musculoskeletal: M/S 5/5 throughout.  No deformity or atrophy. No edema. Neurologic: CN 2-12 intact. Sensation grossly intact in extremities.  Symmetrical.  Speech is fluent. Motor exam as listed above. Psychiatric: Judgment intact, Mood & affect appropriate for pt's clinical situation. Dermatologic: No rashes or ulcers noted.  No cellulitis or open wounds. Lymph : No Cervical, Axillary, or Inguinal lymphadenopathy.     CBC Lab Results  Component Value Date   WBC 15.6 (H) 05/16/2016   HGB 14.9 05/16/2016   HCT 44.5 05/16/2016   MCV 93.0 05/16/2016   PLT 186 05/16/2016    BMET    Component Value Date/Time   NA 140 05/16/2016 0359   K 4.3 05/16/2016 0359   CL 106 05/16/2016 0359   CO2 28 05/16/2016 0359   GLUCOSE 141 (H) 05/16/2016 0359   BUN 14 05/16/2016 0359   CREATININE 0.70 05/16/2016 0359   CALCIUM 9.4 05/16/2016 0359   GFRNONAA >60 05/16/2016 0359   GFRAA >60 05/16/2016 0359   CrCl cannot be calculated (Patient's most recent lab result is older than the maximum 21 days  allowed.).  COAG Lab Results  Component Value Date   INR 0.95 11/17/2015    Radiology No results found.    Assessment/Plan Tobacco use disorder We again discussed the continued tobacco use would increase her risk of recurrence of her carotid disease and also increased the progression of peripheral arterial disease and her extremities is that his indeed the cause for her problems, which I suspect it is.  Essential hypertension blood pressure control important in reducing the progression of atherosclerotic disease. On appropriate oral medications.   Hyperlipidemia lipid control important in reducing the progression of atherosclerotic disease. Continue statin therapy   Carotid stenosis Her carotid duplex today reveals a widely patent right carotid artery stent without restenosis. Her left carotid artery stenosis remains in the 40-59% range, but on the lower end of that range. This has not progressed from her previous study. She will continue aspirin, Plavix, and Crestor. We will recheck her carotid arteries in 6 months with duplex.  Atherosclerosis of native arteries of extremity with intermittent claudication [facility]) The patient is describing worsening symptoms of claudication of her right lower extremity. Although other causes such as neurogenic claudication and musculoskeletal issues could be a cause, given her extensive tobacco history and known vascular disease elsewhere, arterial insufficiency causing claudication is the most likely source. We have discussed the pathophysiology and natural history of peripheral arterial disease. I recommended noninvasive studies be performed at her convenience in the near future. She will return following these studies to discuss the results and determine further treatment options. I recommended she increase her exercise regimen and continue her current medical regimen. Smoking cessation was again discussed.    Festus Barren, MD  09/15/2016 2:38  PM    This note was created with Dragon medical transcription system.  Any errors from dictation are purely unintentional

## 2016-09-15 NOTE — Assessment & Plan Note (Signed)
We again discussed the continued tobacco use would increase her risk of recurrence of her carotid disease and also increased the progression of peripheral arterial disease and her extremities is that his indeed the cause for her problems, which I suspect it is.

## 2016-09-15 NOTE — Assessment & Plan Note (Signed)
The patient is describing worsening symptoms of claudication of her right lower extremity. Although other causes such as neurogenic claudication and musculoskeletal issues could be a cause, given her extensive tobacco history and known vascular disease elsewhere, arterial insufficiency causing claudication is the most likely source. We have discussed the pathophysiology and natural history of peripheral arterial disease. I recommended noninvasive studies be performed at her convenience in the near future. She will return following these studies to discuss the results and determine further treatment options. I recommended she increase her exercise regimen and continue her current medical regimen. Smoking cessation was again discussed.

## 2016-09-15 NOTE — Assessment & Plan Note (Signed)
lipid control important in reducing the progression of atherosclerotic disease. Continue statin therapy  

## 2016-10-12 ENCOUNTER — Ambulatory Visit (INDEPENDENT_AMBULATORY_CARE_PROVIDER_SITE_OTHER): Payer: BLUE CROSS/BLUE SHIELD | Admitting: Vascular Surgery

## 2016-10-12 ENCOUNTER — Ambulatory Visit (INDEPENDENT_AMBULATORY_CARE_PROVIDER_SITE_OTHER): Payer: BLUE CROSS/BLUE SHIELD

## 2016-10-12 ENCOUNTER — Encounter (INDEPENDENT_AMBULATORY_CARE_PROVIDER_SITE_OTHER): Payer: Self-pay | Admitting: Vascular Surgery

## 2016-10-12 VITALS — BP 151/85 | HR 66 | Resp 17 | Wt 141.0 lb

## 2016-10-12 DIAGNOSIS — I70211 Atherosclerosis of native arteries of extremities with intermittent claudication, right leg: Secondary | ICD-10-CM

## 2016-10-12 DIAGNOSIS — I6523 Occlusion and stenosis of bilateral carotid arteries: Secondary | ICD-10-CM | POA: Diagnosis not present

## 2016-10-12 DIAGNOSIS — I10 Essential (primary) hypertension: Secondary | ICD-10-CM | POA: Diagnosis not present

## 2016-10-12 DIAGNOSIS — F17200 Nicotine dependence, unspecified, uncomplicated: Secondary | ICD-10-CM

## 2016-10-12 DIAGNOSIS — E785 Hyperlipidemia, unspecified: Secondary | ICD-10-CM | POA: Diagnosis not present

## 2016-10-13 LAB — VAS US LOWER EXTREMITY ARTERIAL DUPLEX
RIGHT ANT DIST TIBAL SYS PSV: 26 cm/s
RIGHT POST TIB DIST SYS: 23 cm/s
RSFDPSV: -96 cm/s
RSFMPSV: -108 cm/s
Right peroneal sys PSV: 15 cm/s
Right popliteal dist sys PSV: -42 cm/s
Right popliteal prox sys PSV: 35 cm/s
Right super femoral prox sys PSV: -82 cm/s

## 2016-11-20 ENCOUNTER — Other Ambulatory Visit: Payer: Self-pay | Admitting: Neurology

## 2016-11-20 DIAGNOSIS — H539 Unspecified visual disturbance: Secondary | ICD-10-CM

## 2016-11-20 DIAGNOSIS — R200 Anesthesia of skin: Secondary | ICD-10-CM

## 2016-12-01 ENCOUNTER — Ambulatory Visit
Admission: RE | Admit: 2016-12-01 | Discharge: 2016-12-01 | Disposition: A | Discharge status: Home / Self Care | Payer: BLUE CROSS/BLUE SHIELD | Source: Ambulatory Visit | Attending: Neurology | Admitting: Neurology

## 2016-12-01 DIAGNOSIS — G245 Blepharospasm: Secondary | ICD-10-CM | POA: Insufficient documentation

## 2016-12-01 DIAGNOSIS — H5710 Ocular pain, unspecified eye: Secondary | ICD-10-CM | POA: Diagnosis not present

## 2016-12-01 DIAGNOSIS — H539 Unspecified visual disturbance: Secondary | ICD-10-CM

## 2016-12-01 DIAGNOSIS — R202 Paresthesia of skin: Secondary | ICD-10-CM | POA: Insufficient documentation

## 2016-12-01 DIAGNOSIS — R200 Anesthesia of skin: Secondary | ICD-10-CM | POA: Insufficient documentation

## 2016-12-01 MED ORDER — GADOBENATE DIMEGLUMINE 529 MG/ML IV SOLN
15.0000 mL | Freq: Once | INTRAVENOUS | Status: AC | PRN
  Administered 2016-12-01: 13 mL via INTRAVENOUS

## 2016-12-26 NOTE — Progress Notes (Signed)
Subjective:    Patient ID: Bethany MorelLaura E Lozano, female    DOB: [birth year], 66 y.o.   MRN: [id number] Chief Complaint  Patient presents with  . Follow-up   Patient last seen on 09/15/2016 for evaluation of right lower extremity "burning" and "pain" with activity. Her symptoms remain stable. The patient underwent a bilateral lower arterial ABI which was notable for moderate right lower extremity arterial disease and no evidence of left lower extremity arterial disease. There is no previous ABI for comparison. Patient also underwent a right lower extremity arterial duplex which was notable for monophasic blood flow, no hemodynamically significant focal stenosis noted in the right lower extremity with possible significant right iliac level arterial disease. There was no previous duplex available for comparison. Patient denies any fever, nausea or vomiting.   Review of Systems  Constitutional: Negative.   HENT: Negative.   Eyes: Negative.   Respiratory: Negative.   Cardiovascular:       Right lower extremity claudication  Gastrointestinal: Negative.   Endocrine: Negative.   Genitourinary: Negative.   Musculoskeletal: Negative.   Skin: Negative.   Allergic/Immunologic: Negative.   Neurological: Negative.   Hematological: Negative.   Psychiatric/Behavioral: Negative.       Objective:   Physical Exam  Constitutional: She is oriented to person, place, and time. She appears well-developed and well-nourished. No distress.  HENT:  Head: Normocephalic and atraumatic.  Eyes: Conjunctivae are normal. Pupils are equal, round, and reactive to light.  Neck: Normal range of motion.  Cardiovascular: Normal rate, regular rhythm, normal heart sounds and intact distal pulses.   Pulses:      Radial pulses are 2+ on the right side, and 2+ on the left side.       Dorsalis pedis pulses are 2+ on the left side.       Posterior tibial pulses are 2+ on the left side.  Unable to palpate pedal pulses on the  right lower extremity  Pulmonary/Chest: Effort normal.  Musculoskeletal: Normal range of motion. She exhibits no edema.  Neurological: She is alert and oriented to person, place, and time.  Skin: Skin is warm and dry. She is not diaphoretic.  Psychiatric: She has a normal mood and affect. Her behavior is normal. Judgment and thought content normal.  Vitals reviewed.  BP (!) 151/85   Pulse 66   Resp 17   Wt 141 lb (64 kg)   BMI 23.46 kg/m   Past Medical History:  Diagnosis Date  . Anxiety   . Depression   . Heart murmur   . Hemorrhoids   . Hyperlipidemia   . Peripheral vascular disease (HCC)   . Sinusitis   . Tobacco use    Social History   Social History  . Marital status: Single    Spouse name: N/A  . Number of children: N/A  . Years of education: N/A   Occupational History  . Not on file.   Social History Main Topics  . Smoking status: Current Every Day Smoker    Packs/day: 1.00    Years: 45.00    Types: Cigarettes  . Smokeless tobacco: Never Used  . Alcohol use 0.0 oz/week  . Drug use: No  . Sexual activity: Not on file   Other Topics Concern  . Not on file   Social History Narrative  . No narrative on file   Past Surgical History:  Procedure Laterality Date  . ABDOMINAL HYSTERECTOMY  1991  . APPENDECTOMY    . CARDIAC  CATHETERIZATION N/A 11/08/2015   Procedure: Left Heart Cath and Coronary Angiography;  Surgeon: Laurier Nancy, MD;  Location: [facility];  Service: Cardiovascular;  Laterality: N/A;  . CARDIAC CATHETERIZATION     Mynx placed in right artery after heart catherization  . COLONOSCOPY  2014  . ENDARTERECTOMY Right 11/25/2015   Procedure: ENDARTERECTOMY CAROTID;  Surgeon: Annice Needy, MD;  Location: [facility];  Service: Vascular;  Laterality: Right;  . FISSURECTOMY  10/22/14  . HEMORRHOIDECTOMY WITH HEMORRHOID BANDING  1991,06/20/14, 10/22/14  . PERIPHERAL VASCULAR CATHETERIZATION Right 05/15/2016   Procedure: Carotid PTA/Stent  Intervention;  Surgeon: Annice Needy, MD;  Location: [facility];  Service: Cardiovascular;  Laterality: Right;  . TUBAL LIGATION  1977   Family History  Problem Relation Age of Onset  . Breast cancer Neg Hx    Allergies  Allergen Reactions  . Codeine Nausea Only  . Adhesive [Tape] Itching and Rash  . Penicillins Rash    Has patient had a PCN reaction causing immediate rash, facial/tongue/throat swelling, SOB or lightheadedness with hypotension: no Has patient had a PCN reaction causing severe rash involving mucus membranes or skin necrosis: no Has patient had a PCN reaction that required hospitalization {no Has patient had a PCN reaction occurring within the last 10 years: no If all of the above answers are "NO", then may proceed with Cephalosporin use.      Assessment & Plan:  Patient last seen on 09/15/2016 for evaluation of right lower extremity "burning" and "pain" with activity. Her symptoms remain stable. The patient underwent a bilateral lower arterial ABI which was notable for moderate right lower extremity arterial disease and no evidence of left lower extremity arterial disease. There is no previous ABI for comparison. Patient also underwent a right lower extremity arterial duplex which was notable for monophasic blood flow, no hemodynamically significant focal stenosis noted in the right lower extremity with possible significant right iliac level arterial disease. There was no previous duplex available for comparison. Patient denies any fever, nausea or vomiting.  1. Atherosclerosis of native artery of right lower extremity with intermittent claudication (HCC) - stable Patient is experiencing what sounds like right lower extremity claudication Monophasic blood flow on duplex Recommend a right lower extremity angiogram with possible intervention in an attempt to revascularize the extremity At this time, the patient is not willing to proceed with this willcontinue to  monitor  2. Tobacco use disorder - stable I have discussed (approximately 5 minutes) with the patient the role of tobacco in the pathogenesis of atherosclerosis and its effect on the progression of the disease, impact on the durability of interventions and its limitations on the formation of collateral pathways. I have recommended absolute tobacco cessation. I have discussed various options available for assistance with tobacco cessation including over the counter methods (Nicotine gum, patch and lozenges). We also discussed prescription options (Chantix, Nicotine Inhaler / Nasal Spray). The patient is not interested in pursuing any prescription tobacco cessation options at this time. The patient voices their understanding.   3. Hyperlipidemia, unspecified hyperlipidemia type - stable Encouraged good control as its slows the progression of atherosclerotic disease  4. Essential hypertension - stable Encouraged good control as its slows the progression of atherosclerotic disease  5. Bilateral carotid artery stenosis - stable This is followed on a regular basis.  Current Outpatient Prescriptions on File Prior to Visit  Medication Sig Dispense Refill  . acetaminophen (TYLENOL) 500 MG tablet Take 500-1,000 mg  by mouth every 6 (six) hours as needed for mild pain or headache.    Marland Kitchen aspirin 81 MG tablet Take 81 mg by mouth every evening.     . chlorthalidone (HYGROTON) 25 MG tablet Take 12.5 mg by mouth daily.  0  . Cholecalciferol (VITAMIN D3) 5000 units CAPS Take 1 capsule by mouth daily.     . clonazePAM (KLONOPIN) 0.5 MG tablet Take 0.5 mg by mouth 2 (two) times daily.   0  . clopidogrel (PLAVIX) 75 MG tablet Take 1 tablet (75 mg total) by mouth daily with breakfast. 30 tablet 3  . fluticasone (FLONASE) 50 MCG/ACT nasal spray Place 1 spray into both nostrils every evening.   0  . loratadine (CLARITIN) 10 MG tablet Take 10 mg by mouth daily as needed for allergies.    . mometasone (ELOCON) 0.1 %  cream Apply 1 application topically daily as needed.   5  . montelukast (SINGULAIR) 10 MG tablet Take 10 mg by mouth every morning.   0  . PREMARIN 0.3 MG tablet Take 0.3 mg by mouth every evening.   3  . rosuvastatin (CRESTOR) 40 MG tablet Take 40 mg by mouth at bedtime.     Marland Kitchen VIIBRYD 40 MG TABS Take 20 mg by mouth every 3 (three) days.   0   No current facility-administered medications on file prior to visit.     There are no Patient Instructions on file for this visit. No Follow-up on file.   Bethany Villalva A Bettejane Leavens, PA-C

## 2017-02-19 ENCOUNTER — Other Ambulatory Visit: Payer: Self-pay | Admitting: Neurology

## 2017-02-19 DIAGNOSIS — M542 Cervicalgia: Secondary | ICD-10-CM

## 2017-03-01 ENCOUNTER — Ambulatory Visit
Admission: RE | Admit: 2017-03-01 | Discharge: 2017-03-01 | Disposition: A | Discharge status: Home / Self Care | Payer: BLUE CROSS/BLUE SHIELD | Source: Ambulatory Visit | Attending: Neurology | Admitting: Neurology

## 2017-03-01 DIAGNOSIS — M4802 Spinal stenosis, cervical region: Secondary | ICD-10-CM | POA: Insufficient documentation

## 2017-03-01 DIAGNOSIS — M542 Cervicalgia: Secondary | ICD-10-CM

## 2017-03-01 DIAGNOSIS — M50322 Other cervical disc degeneration at C5-C6 level: Secondary | ICD-10-CM | POA: Diagnosis not present

## 2017-03-01 DIAGNOSIS — R200 Anesthesia of skin: Secondary | ICD-10-CM | POA: Insufficient documentation

## 2017-03-01 DIAGNOSIS — R202 Paresthesia of skin: Secondary | ICD-10-CM | POA: Diagnosis not present

## 2017-03-23 ENCOUNTER — Ambulatory Visit (INDEPENDENT_AMBULATORY_CARE_PROVIDER_SITE_OTHER): Payer: BLUE CROSS/BLUE SHIELD | Admitting: Vascular Surgery

## 2017-03-23 ENCOUNTER — Ambulatory Visit (INDEPENDENT_AMBULATORY_CARE_PROVIDER_SITE_OTHER): Payer: BLUE CROSS/BLUE SHIELD

## 2017-03-23 ENCOUNTER — Encounter (INDEPENDENT_AMBULATORY_CARE_PROVIDER_SITE_OTHER): Payer: Self-pay | Admitting: Vascular Surgery

## 2017-03-23 VITALS — BP 137/78 | HR 72 | Resp 15 | Ht 65.0 in | Wt 139.0 lb

## 2017-03-23 DIAGNOSIS — F17200 Nicotine dependence, unspecified, uncomplicated: Secondary | ICD-10-CM

## 2017-03-23 DIAGNOSIS — I70211 Atherosclerosis of native arteries of extremities with intermittent claudication, right leg: Secondary | ICD-10-CM | POA: Diagnosis not present

## 2017-03-23 DIAGNOSIS — I10 Essential (primary) hypertension: Secondary | ICD-10-CM

## 2017-03-23 DIAGNOSIS — I6523 Occlusion and stenosis of bilateral carotid arteries: Secondary | ICD-10-CM

## 2017-03-23 NOTE — Assessment & Plan Note (Signed)
The patient has moderate claudication symptoms. We had a long discussion today regarding treatment options. Given the fact that she does not have limb threatening symptoms, intervention would be elected at this point. She understands that given her continued tobacco use and her propensity for scar tissue, any intervention would likely have recurrence. Also, with her right common femoral expected to be a primary location of disease on the right, this may require open surgical therapy. She does not 1 any intervention at this time and I think that reasonable. Recheck her ABIs in 6 months. Continue aspirin, Plavix, and a statin agent

## 2017-03-23 NOTE — Patient Instructions (Signed)

## 2017-03-23 NOTE — Assessment & Plan Note (Signed)
Her carotid duplex today reveals a right carotid stent which is widely patent. Her left carotid artery stenosis is on the upper end of the 1-39% range. She is doing well after carotid stent placement about 10 months ago. Continue aspirin, Plavix, and a statin agent. Recheck in 6 months.

## 2017-03-23 NOTE — Progress Notes (Signed)
MRN : [id number]  Bethany Lozano is a 66 y.o. (April 25, 1951) female who presents with chief complaint of  Chief Complaint  Patient presents with  . Carotid    6 month carotid f/u  .  History of Present Illness: Patient returns in follow-up of her carotid and peripheral arterial disease. She continues to have moderate claudication symptoms in the right leg that are somewhat lifestyle limiting. These have progressed slightly from her visit earlier this year. She denies any ulceration, infection, or ischemic rest pain  From a cerebrovascular standpoint, she remains asymptomatic. Specifically, the patient denies amaurosis fugax, speech or swallowing difficulties, or arm or leg weakness or numbness. She is about 10 months status post right carotid stent placement for high-grade recurrent stenosis. Her carotid duplex today reveals a right carotid stent which is widely patent. Her left carotid artery stenosis is on the upper end of the 1-39% range.  From a cardiovascular Current Outpatient Prescriptions  Medication Sig Dispense Refill  . acetaminophen (TYLENOL) 500 MG tablet Take 500-1,000 mg by mouth every 6 (six) hours as needed for mild pain or headache.    Marland Kitchen aspirin 81 MG tablet Take 81 mg by mouth every evening.     . chlorthalidone (HYGROTON) 25 MG tablet Take 12.5 mg by mouth daily.  0  . Cholecalciferol (VITAMIN D3) 5000 units CAPS Take 1 capsule by mouth daily.     . clonazePAM (KLONOPIN) 0.5 MG tablet Take 0.5 mg by mouth 2 (two) times daily.   0  . clopidogrel (PLAVIX) 75 MG tablet Take 1 tablet (75 mg total) by mouth daily with breakfast. 30 tablet 3  . fluticasone (FLONASE) 50 MCG/ACT nasal spray Place 1 spray into both nostrils every evening.   0  . loratadine (CLARITIN) 10 MG tablet Take 10 mg by mouth daily as needed for allergies.    . mometasone (ELOCON) 0.1 % cream Apply 1 application topically daily as needed.   5  . montelukast (SINGULAIR) 10 MG tablet Take 10 mg  by mouth every morning.   0  . PREMARIN 0.3 MG tablet Take 0.3 mg by mouth every evening.   3  . rosuvastatin (CRESTOR) 40 MG tablet Take 40 mg by mouth at bedtime.     Marland Kitchen VIIBRYD 40 MG TABS Take 20 mg by mouth every 3 (three) days.   0   No current facility-administered medications for this visit.         Past Medical History:  Diagnosis Date  . Anxiety   . Depression   . Heart murmur   . Hemorrhoids   . Hyperlipidemia   . Peripheral vascular disease (HCC)   . Sinusitis   . Tobacco use     Past Surgical History:  Procedure Laterality Date  . ABDOMINAL HYSTERECTOMY  1991  . APPENDECTOMY    . CARDIAC CATHETERIZATION N/A 11/08/2015   Procedure: Left Heart Cath and Coronary Angiography;  Surgeon: Laurier Nancy, MD;  Location: [facility];  Service: Cardiovascular;  Laterality: N/A;  . CARDIAC CATHETERIZATION     Mynx placed in right artery after heart catherization  . COLONOSCOPY  2014  . ENDARTERECTOMY Right 11/25/2015   Procedure: ENDARTERECTOMY CAROTID;  Surgeon: Annice Needy, MD;  Location: [facility];  Service: Vascular;  Laterality: Right;  . FISSURECTOMY  10/22/14  . HEMORRHOIDECTOMY WITH HEMORRHOID BANDING  1991,06/20/14, 10/22/14  . PERIPHERAL VASCULAR CATHETERIZATION Right 05/15/2016   Procedure: Carotid PTA/Stent Intervention;  Surgeon: Annice Needy,  MD;  Location: [facility];  Service: Cardiovascular;  Laterality: Right;  . TUBAL LIGATION  1977    Social History       Social History  Substance Use Topics  . Smoking status: Current Every Day Smoker    Packs/day: 1.00    Years: 45.00    Types: Cigarettes  . Smokeless tobacco: Never Used  . Alcohol use 0.0 oz/week     Family History      Family History  Problem Relation Age of Onset  . Breast cancer Neg Hx   No bleeding disorders, clotting disorders, or autoimmune diseases, or porphyria       Allergies  Allergen Reactions  . Codeine Nausea Only  .  Adhesive [Tape] Itching and Rash  . Penicillins Rash    Has patient had a PCN reaction causing immediate rash, facial/tongue/throat swelling, SOB or lightheadedness with hypotension: no Has patient had a PCN reaction causing severe rash involving mucus membranes or skin necrosis: no Has patient had a PCN reaction that required hospitalization {no Has patient had a PCN reaction occurring within the last 10 years: no If all of the above answers are "NO", then may proceed with Cephalosporin use.     REVIEW OF SYSTEMS (Negative unless checked)  Constitutional: Weight loss  Fever  Chills Cardiac: Chest pain   Chest pressure   Palpitations   Shortness of breath when laying flat   Shortness of breath at rest   Shortness of breath with exertion. Vascular:  Pain in legs with walking   Pain in legs at rest   Pain in legs when laying flat   Claudication   Pain in feet when walking  Pain in feet at rest  Pain in feet when laying flat   History of DVT   Phlebitis   Swelling in legs   Varicose veins   Non-healing ulcers Pulmonary:   Uses home oxygen   Productive cough   Hemoptysis   Wheeze  COPD   Asthma Neurologic:  Dizziness  Blackouts   Seizures   History of stroke   History of TIA  Aphasia   Temporary blindness   Dysphagia   Weakness or numbness in arms   Weakness or numbness in legs Musculoskeletal:  Arthritis   Joint swelling   Joint pain   Low back pain Hematologic:  Easy bruising  Easy bleeding   Hypercoagulable state   Anemic  Hepatitis Gastrointestinal:  Blood in stool   Vomiting blood  Gastroesophageal reflux/heartburn   Difficulty swallowing. Genitourinary:  Chronic kidney disease   Difficult urination  Frequent urination  Burning with urination   Blood in urine Skin:  Rashes   Ulcers   Wounds Psychological:  History of anxiety    History of major  depression    Physical Examination  Vitals:   03/23/17 1119 03/23/17 1120  BP: (!) 141/81 137/78  Pulse: 69 72  Resp: 15   Weight: 139 lb (63 kg)   Height:  (1.651 m)    Body mass index is 23.13 kg/m. Gen:  WD/WN, NAD [place]/AT, No temporalis wasting. Ear/Nose/Throat: Hearing grossly intact, nares w/o erythema or drainage, trachea midline Eyes: Conjunctiva clear. Sclera non-icteric Neck: Supple.  No bruit or JVD.  Pulmonary:  Good air movement, equal and clear to auscultation bilaterally.  Cardiac: RRR, normal S1, S2 Vascular:  Vessel Right Left  Radial Palpable Palpable  PT 1+ Palpable Palpable  DP 1+ Palpable Palpable   Musculoskeletal: M/S 5/5 throughout.  No deformity or atrophy. Neurologic: CN 2-12 intact. Sensation grossly intact in extremities.  Symmetrical.  Speech is fluent. Motor exam as listed above. Psychiatric: Judgment intact, Mood & affect appropriate for pt's clinical situation. Dermatologic: No rashes or ulcers noted.  No cellulitis or open wounds.      CBC Lab Results  Component Value Date   WBC 15.6 (H) 05/16/2016   HGB 14.9 05/16/2016   HCT 44.5 05/16/2016   MCV 93.0 05/16/2016   PLT 186 05/16/2016    BMET    Component Value Date/Time   NA 140 05/16/2016 0359   K 4.3 05/16/2016 0359   CL 106 05/16/2016 0359   CO2 28 05/16/2016 0359   GLUCOSE 141 (H) 05/16/2016 0359   BUN 14 05/16/2016 0359   CREATININE 0.70 05/16/2016 0359   CALCIUM 9.4 05/16/2016 0359   GFRNONAA >60 05/16/2016 0359   GFRAA >60 05/16/2016 0359   CrCl cannot be calculated (Patient's most recent lab result is older than the maximum 21 days allowed.).  COAG Lab Results  Component Value Date   INR 0.95 11/17/2015    Radiology Mr Cervical Spine Wo Contrast  Result Date: 03/01/2017 CLINICAL DATA:  Neck pain with bilateral arm pain and numbness and tingling. EXAM: MRI CERVICAL SPINE WITHOUT CONTRAST TECHNIQUE: Multiplanar,  multisequence MR imaging of the cervical spine was performed. No intravenous contrast was administered. COMPARISON:  None. FINDINGS: Alignment: Mild old degenerative posterior subluxation of C5. Otherwise, the alignment is normal. Advanced multilevel facet disease. Vertebrae: Endplate reactive changes at C5-6 and C6-7 but no bone lesions or fracture. Cord: No cord lesions or syrinx. Posterior Fossa, vertebral arteries, paraspinal tissues: No significant findings. Moderate degenerative changes at C1-2 are noted with mild pannus formation but no significant mass effect on the upper cervical cord. Disc levels: C2-3: No significant findings. C3-4:  No significant findings. C4-5:  No significant findings. C5-6: Degenerate disc disease and facet disease. There is a bulging degenerated annulus, osteophytic ridging and uncinate spurring. There is mass effect on the ventral thecal sac and near obliteration of the ventral CSF space. Mild bilateral foraminal stenosis. C6-7: Bulging degenerated annulus, osteophytic ridging and uncinate spurring with mass effect on the ventral thecal sac and near obliteration of the ventral CSF space. Bilateral foraminal encroachment, left greater than right. C7-T1:  No significant findings. IMPRESSION: Degenerative disc disease at C5-6 and C6-7 with mild spinal and foraminal stenosis as discussed above. Electronically Signed   By: Rudie Meyer M.D.   On: 03/01/2017 10:31     Assessment/Plan Tobacco use disorder We again discussed the continued tobacco use would increase her risk of recurrence of her carotid disease and also increased the progression of peripheral arterial disease and her extremities is that his indeed the cause for her problems, which I suspect it is.  Essential hypertension blood pressure control important in reducing the progression of atherosclerotic disease. On appropriate oral medications.   Hyperlipidemia lipid control important in reducing the  progression of atherosclerotic disease. Continue statin therapy  Atherosclerosis of native arteries of extremity with intermittent claudication (HCC) The patient has moderate claudication symptoms. We had a long discussion today regarding treatment options. Given the fact that she does not have limb threatening symptoms, intervention would be elected at this point. She understands that given her continued tobacco use and her propensity for scar tissue, any intervention would likely have recurrence. Also, with her  right common femoral expected to be a primary location of disease on the right, this may require open surgical therapy. She does not 1 any intervention at this time and I think that reasonable. Recheck her ABIs in 6 months. Continue aspirin, Plavix, and a statin agent  Carotid stenosis Her carotid duplex today reveals a right carotid stent which is widely patent. Her left carotid artery stenosis is on the upper end of the 1-39% range. She is doing well after carotid stent placement about 10 months ago. Continue aspirin, Plavix, and a statin agent. Recheck in 6 months.    Festus Barren, MD  03/23/2017 1:31 PM    This note was created with Dragon medical transcription system.  Any errors from dictation are purely unintentional

## 2017-03-29 ENCOUNTER — Other Ambulatory Visit: Payer: Self-pay | Admitting: Internal Medicine

## 2017-03-29 DIAGNOSIS — R748 Abnormal levels of other serum enzymes: Secondary | ICD-10-CM

## 2017-04-04 ENCOUNTER — Ambulatory Visit
Admission: RE | Admit: 2017-04-04 | Discharge: 2017-04-04 | Disposition: A | Discharge status: Home / Self Care | Payer: BLUE CROSS/BLUE SHIELD | Source: Ambulatory Visit | Attending: Internal Medicine | Admitting: Internal Medicine

## 2017-04-04 DIAGNOSIS — R748 Abnormal levels of other serum enzymes: Secondary | ICD-10-CM | POA: Diagnosis present

## 2017-05-16 DIAGNOSIS — G8929 Other chronic pain: Secondary | ICD-10-CM | POA: Diagnosis not present

## 2017-05-16 DIAGNOSIS — M542 Cervicalgia: Secondary | ICD-10-CM | POA: Diagnosis not present

## 2017-05-16 DIAGNOSIS — G5603 Carpal tunnel syndrome, bilateral upper limbs: Secondary | ICD-10-CM | POA: Diagnosis not present

## 2017-05-16 DIAGNOSIS — M25512 Pain in left shoulder: Secondary | ICD-10-CM | POA: Diagnosis not present

## 2017-05-16 DIAGNOSIS — G5621 Lesion of ulnar nerve, right upper limb: Secondary | ICD-10-CM | POA: Diagnosis not present

## 2017-05-16 DIAGNOSIS — I6523 Occlusion and stenosis of bilateral carotid arteries: Secondary | ICD-10-CM | POA: Diagnosis not present

## 2017-05-18 DIAGNOSIS — M7542 Impingement syndrome of left shoulder: Secondary | ICD-10-CM | POA: Diagnosis not present

## 2017-05-18 DIAGNOSIS — M7582 Other shoulder lesions, left shoulder: Secondary | ICD-10-CM | POA: Diagnosis not present

## 2017-05-18 DIAGNOSIS — M25512 Pain in left shoulder: Secondary | ICD-10-CM | POA: Diagnosis not present

## 2017-06-04 DIAGNOSIS — M7552 Bursitis of left shoulder: Secondary | ICD-10-CM | POA: Diagnosis not present

## 2017-06-08 DIAGNOSIS — E785 Hyperlipidemia, unspecified: Secondary | ICD-10-CM | POA: Diagnosis not present

## 2017-06-13 DIAGNOSIS — K5900 Constipation, unspecified: Secondary | ICD-10-CM | POA: Diagnosis not present

## 2017-06-13 DIAGNOSIS — F17200 Nicotine dependence, unspecified, uncomplicated: Secondary | ICD-10-CM | POA: Diagnosis not present

## 2017-06-13 DIAGNOSIS — K259 Gastric ulcer, unspecified as acute or chronic, without hemorrhage or perforation: Secondary | ICD-10-CM | POA: Diagnosis not present

## 2017-06-13 DIAGNOSIS — F341 Dysthymic disorder: Secondary | ICD-10-CM | POA: Diagnosis not present

## 2017-06-13 DIAGNOSIS — E785 Hyperlipidemia, unspecified: Secondary | ICD-10-CM | POA: Diagnosis not present

## 2017-06-13 DIAGNOSIS — K649 Unspecified hemorrhoids: Secondary | ICD-10-CM | POA: Diagnosis not present

## 2017-06-13 DIAGNOSIS — F418 Other specified anxiety disorders: Secondary | ICD-10-CM | POA: Diagnosis not present

## 2017-06-13 DIAGNOSIS — I10 Essential (primary) hypertension: Secondary | ICD-10-CM | POA: Diagnosis not present

## 2017-06-13 DIAGNOSIS — R748 Abnormal levels of other serum enzymes: Secondary | ICD-10-CM | POA: Diagnosis not present

## 2017-06-13 DIAGNOSIS — J329 Chronic sinusitis, unspecified: Secondary | ICD-10-CM | POA: Diagnosis not present

## 2017-07-13 ENCOUNTER — Other Ambulatory Visit: Payer: Self-pay | Admitting: Internal Medicine

## 2017-07-13 DIAGNOSIS — Z1231 Encounter for screening mammogram for malignant neoplasm of breast: Secondary | ICD-10-CM

## 2017-07-30 ENCOUNTER — Ambulatory Visit
Admission: RE | Admit: 2017-07-30 | Discharge: 2017-07-30 | Disposition: A | Discharge status: Home / Self Care | Payer: Medicare HMO | Source: Ambulatory Visit | Attending: Internal Medicine | Admitting: Internal Medicine

## 2017-07-30 DIAGNOSIS — Z1231 Encounter for screening mammogram for malignant neoplasm of breast: Secondary | ICD-10-CM | POA: Diagnosis not present

## 2017-08-28 ENCOUNTER — Other Ambulatory Visit: Payer: Self-pay | Admitting: Sports Medicine

## 2017-08-28 DIAGNOSIS — M7582 Other shoulder lesions, left shoulder: Secondary | ICD-10-CM

## 2017-08-28 DIAGNOSIS — M7552 Bursitis of left shoulder: Secondary | ICD-10-CM

## 2017-08-30 ENCOUNTER — Encounter: Payer: Self-pay | Admitting: Cardiology

## 2017-09-06 ENCOUNTER — Ambulatory Visit
Admission: RE | Admit: 2017-09-06 | Discharge: 2017-09-06 | Disposition: A | Discharge status: Home / Self Care | Payer: Medicare HMO | Source: Ambulatory Visit | Attending: Sports Medicine | Admitting: Sports Medicine

## 2017-09-06 DIAGNOSIS — X58XXXA Exposure to other specified factors, initial encounter: Secondary | ICD-10-CM | POA: Insufficient documentation

## 2017-09-06 DIAGNOSIS — S46812A Strain of other muscles, fascia and tendons at shoulder and upper arm level, left arm, initial encounter: Secondary | ICD-10-CM | POA: Insufficient documentation

## 2017-09-06 DIAGNOSIS — M7552 Bursitis of left shoulder: Secondary | ICD-10-CM | POA: Diagnosis present

## 2017-09-06 DIAGNOSIS — M7582 Other shoulder lesions, left shoulder: Secondary | ICD-10-CM | POA: Diagnosis not present

## 2017-09-21 ENCOUNTER — Encounter (INDEPENDENT_AMBULATORY_CARE_PROVIDER_SITE_OTHER): Payer: Self-pay | Admitting: Vascular Surgery

## 2017-09-21 ENCOUNTER — Ambulatory Visit (INDEPENDENT_AMBULATORY_CARE_PROVIDER_SITE_OTHER): Payer: Medicare HMO

## 2017-09-21 ENCOUNTER — Ambulatory Visit (INDEPENDENT_AMBULATORY_CARE_PROVIDER_SITE_OTHER): Payer: Medicare HMO | Admitting: Vascular Surgery

## 2017-09-21 VITALS — BP 148/83 | HR 70 | Resp 18 | Ht 65.0 in | Wt 138.0 lb

## 2017-09-21 DIAGNOSIS — I6523 Occlusion and stenosis of bilateral carotid arteries: Secondary | ICD-10-CM

## 2017-09-21 DIAGNOSIS — E785 Hyperlipidemia, unspecified: Secondary | ICD-10-CM | POA: Diagnosis not present

## 2017-09-21 DIAGNOSIS — F17200 Nicotine dependence, unspecified, uncomplicated: Secondary | ICD-10-CM

## 2017-09-21 DIAGNOSIS — I70211 Atherosclerosis of native arteries of extremities with intermittent claudication, right leg: Secondary | ICD-10-CM

## 2017-09-21 NOTE — Progress Notes (Signed)
Subjective:    Patient ID: Bethany Lozano, female    DOB: 09/17/1950, 67 y.o.   MRN: [id number] Chief Complaint  Patient presents with  . Follow-up    9mo abi,carotid   The patient presents for his 71-month follow-up regarding her peripheral artery disease and carotid artery stenosis.  The patient today presents with a complaint. The patient denies experiencing Amaurosis Fugax, TIA like symptoms or focal motor deficits. The patient denies any worsening claudication like symptoms, rest pain or ulcers to her feet / toes.  The patient underwent bilateral carotid artery duplex which was notable for intimal thickening located in the right proximal ICA the distal common carotid artery.  Left ICA stenosis 139%.  Bilateral vertebral arteries demonstrate antegrade flow.  Normal flow dynamics were seen in the bilateral subclavian arteries.  This is stable when compared to the previous exam on March 23, 2017 the patient also underwent a bilateral ABI which was notable for monophasic blood flow to the right tibials.  Triphasic blood flow to the left tibials.  Right ABI is 0.70 left ABI 0.99 when compared to the previous on October 12, 2016 right ABI 0.75 left ABI 0.96 the patient denies any fever, nausea vomiting.  Review of Systems  Constitutional: Negative.   HENT: Negative.   Eyes: Negative.   Respiratory: Negative.   Cardiovascular: Negative.   Gastrointestinal: Negative.   Endocrine: Negative.   Genitourinary: Negative.   Musculoskeletal: Negative.   Skin: Negative.   Allergic/Immunologic: Negative.   Neurological: Negative.   Hematological: Negative.   Psychiatric/Behavioral: Negative.       Objective:   Physical Exam  Constitutional: She is oriented to person, place, and time. She appears well-developed and well-nourished. No distress.  HENT:  Head: Normocephalic and atraumatic.  Eyes: Pupils are equal, round, and reactive to light. Conjunctivae are normal.  Neck: Normal range of  motion.  No carotid bruits noted  Cardiovascular: Normal rate, regular rhythm, normal heart sounds and intact distal pulses.  Pulses:      Radial pulses are 2+ on the right side, and 2+ on the left side.       Dorsalis pedis pulses are 1+ on the right side, and 2+ on the left side.       Posterior tibial pulses are 1+ on the right side, and 2+ on the left side.  Pulmonary/Chest: Effort normal and breath sounds normal.  Musculoskeletal: Normal range of motion. She exhibits no edema.  Neurological: She is alert and oriented to person, place, and time.  Skin: Skin is warm and dry. She is not diaphoretic.  Psychiatric: She has a normal mood and affect. Her behavior is normal. Judgment and thought content normal.  Vitals reviewed.  BP (!) 148/83 (BP Location: Left Arm)   Pulse 70   Resp 18   Ht 5\' 5"  (1.651 m)   Wt 138 lb (62.6 kg)   BMI 22.96 kg/m   Past Medical History:  Diagnosis Date  . Anxiety   . Depression   . Heart murmur   . Hemorrhoids   . Hyperlipidemia   . Peripheral vascular disease (HCC)   . Sinusitis   . Tobacco use    Social History   Socioeconomic History  . Marital status: Single    Spouse name: Not on file  . Number of children: Not on file  . Years of education: Not on file  . Highest education level: Not on file  Occupational History  . Not on file  Social Needs  . Financial resource strain: Not on file  . Food insecurity:    Worry: Not on file    Inability: Not on file  . Transportation needs:    Medical: Not on file    Non-medical: Not on file  Tobacco Use  . Smoking status: Current Every Day Smoker    Packs/day: 1.00    Years: 45.00    Pack years: 45.00    Types: Cigarettes  . Smokeless tobacco: Never Used  Substance and Sexual Activity  . Alcohol use: Yes    Alcohol/week: 0.0 oz  . Drug use: No  . Sexual activity: Not on file  Lifestyle  . Physical activity:    Days per week: Not on file    Minutes per session: Not on file  .  Stress: Not on file  Relationships  . Social connections:    Talks on phone: Not on file    Gets together: Not on file    Attends religious service: Not on file    Active member of club or organization: Not on file    Attends meetings of clubs or organizations: Not on file    Relationship status: Not on file  . Intimate partner violence:    Fear of current or ex partner: Not on file    Emotionally abused: Not on file    Physically abused: Not on file    Forced sexual activity: Not on file  Other Topics Concern  . Not on file  Social History Narrative  . Not on file   Past Surgical History:  Procedure Laterality Date  . ABDOMINAL HYSTERECTOMY  1991  . APPENDECTOMY    . CARDIAC CATHETERIZATION N/A 11/08/2015   Procedure: Left Heart Cath and Coronary Angiography;  Surgeon: Laurier Nancy, MD;  Location: [facility];  Service: Cardiovascular;  Laterality: N/A;  . CARDIAC CATHETERIZATION     Mynx placed in right artery after heart catherization  . COLONOSCOPY  2014  . ENDARTERECTOMY Right 11/25/2015   Procedure: ENDARTERECTOMY CAROTID;  Surgeon: Annice Needy, MD;  Location: [facility];  Service: Vascular;  Laterality: Right;  . FISSURECTOMY  10/22/14  . HEMORRHOIDECTOMY WITH HEMORRHOID BANDING  1991,06/20/14, 10/22/14  . PERIPHERAL VASCULAR CATHETERIZATION Right 05/15/2016   Procedure: Carotid PTA/Stent Intervention;  Surgeon: Annice Needy, MD;  Location: [facility];  Service: Cardiovascular;  Laterality: Right;  . TUBAL LIGATION  1977   Family History  Problem Relation Age of Onset  . Breast cancer Neg Hx    Allergies  Allergen Reactions  . Codeine Nausea Only  . Adhesive [Tape] Itching and Rash  . Penicillins Rash    Has patient had a PCN reaction causing immediate rash, facial/tongue/throat swelling, SOB or lightheadedness with hypotension: no Has patient had a PCN reaction causing severe rash involving mucus membranes or skin necrosis: no Has patient had a PCN  reaction that required hospitalization {no Has patient had a PCN reaction occurring within the last 10 years: no If all of the above answers are "NO", then may proceed with Cephalosporin use.      Assessment & Plan:  The patient presents for his 21-month follow-up regarding her peripheral artery disease and carotid artery stenosis.  The patient today presents with a complaint. The patient denies experiencing Amaurosis Fugax, TIA like symptoms or focal motor deficits. The patient denies any worsening claudication like symptoms, rest pain or ulcers to her feet / toes.  The patient underwent bilateral carotid  artery duplex which was notable for intimal thickening located in the right proximal ICA the distal common carotid artery.  Left ICA stenosis 139%.  Bilateral vertebral arteries demonstrate antegrade flow.  Normal flow dynamics were seen in the bilateral subclavian arteries.  This is stable when compared to the previous exam on March 23, 2017 the patient also underwent a bilateral ABI which was notable for monophasic blood flow to the right tibials.  Triphasic blood flow to the left tibials.  Right ABI is 0.70 left ABI 0.99 when compared to the previous on October 12, 2016 right ABI 0.75 left ABI 0.96 the patient denies any fever, nausea vomiting.  1. Bilateral carotid artery stenosis - Stable Studies reviewed with patient. Patient asymptomatic with stable duplex.  No intervention at this time.  Patient to return in six months for surveillance carotid duplex. I have discussed with the patient at length the risk factors for and pathogenesis of atherosclerotic disease and encouraged a healthy diet, regular exercise regimen and blood pressure / glucose control.  Patient was instructed to contact our office in the interim with problems such as arm / leg weakness or numbness, speech / swallowing difficulty or temporary monocular blindness. The patient expresses their understanding.   - VAS US CAROTID;  Future  2. Atherosclerosis of native artery of right lower extremity with intermittent claudication (HCC) - Stable Studies reviewed with patient. Symptoms stable ABI stable  Physical exam stable Patient to follow up in 6 months. I have discussed with the patient at length the risk factors for and pathogenesis of atherosclerotic disease and encouraged a healthy diet, regular exercise regimen and blood pressure / glucose control.  The patient was encouraged to call the office in the interim if he experiences any claudication like symptoms, rest pain or ulcers to his feet / toes.  - VAS [place] ABI WITH/WO TBI; Future  3. Tobacco use disorder - Stable We had a discussion for approximately 5 minutes regarding the absolute need for smoking cessation due to the deleterious nature of tobacco on the vascular system. We discussed the tobacco use would diminish patency of any intervention, and likely significantly worsen progressio of disease. We discussed multiple agents for quitting including replacement therapy or medications to reduce cravings such as Chantix. The patient voices their understanding of the importance of smoking cessation.  4. Hyperlipidemia, unspecified hyperlipidemia type - Stable Encouraged good control as its slows the progression of atherosclerotic disease  Current Outpatient Medications on File Prior to Visit  Medication Sig Dispense Refill  . acetaminophen (TYLENOL) 500 MG tablet Take 500-1,000 mg by mouth every 6 (six) hours as needed for mild pain or headache.    Marland Kitchen aspirin 81 MG tablet Take 81 mg by mouth every evening.     . chlorthalidone (HYGROTON) 25 MG tablet Take 12.5 mg by mouth daily.  0  . Cholecalciferol (VITAMIN D3) 5000 units CAPS Take 1 capsule by mouth daily.     . clobetasol (TEMOVATE) 0.05 % GEL daily as needed.     . clonazePAM (KLONOPIN) 0.5 MG tablet Take 0.5 mg by mouth 2 (two) times daily.   0  . clopidogrel (PLAVIX) 75 MG tablet Take 1 tablet (75 mg  total) by mouth daily with breakfast. 30 tablet 3  . DULoxetine (CYMBALTA) 30 MG capsule Take by mouth.    . fluticasone (FLONASE) 50 MCG/ACT nasal spray Place 1 spray into both nostrils every evening.   0  . loratadine (CLARITIN) 10 MG tablet Take 10  mg by mouth daily as needed for allergies.    . mometasone (ELOCON) 0.1 % cream Apply 1 application topically daily as needed.   5  . montelukast (SINGULAIR) 10 MG tablet Take 10 mg by mouth every morning.   0  . PREMARIN 0.3 MG tablet Take 0.3 mg by mouth every evening.   3  . rosuvastatin (CRESTOR) 40 MG tablet Take 20 mg by mouth at bedtime.     Marland Kitchen. VIIBRYD 40 MG TABS Take 20 mg by mouth every 3 (three) days.   0   No current facility-administered medications on file prior to visit.    There are no Patient Instructions on file for this visit. No follow-ups on file.  Alecsander Hattabaugh A Mita Vallo, PA-C

## 2017-10-02 ENCOUNTER — Encounter
Admission: RE | Admit: 2017-10-02 | Discharge: 2017-10-02 | Disposition: A | Discharge status: Home / Self Care | Payer: Medicare HMO | Source: Ambulatory Visit | Attending: Orthopedic Surgery | Admitting: Orthopedic Surgery

## 2017-10-02 ENCOUNTER — Other Ambulatory Visit: Payer: Self-pay

## 2017-10-02 DIAGNOSIS — Z01818 Encounter for other preprocedural examination: Secondary | ICD-10-CM | POA: Insufficient documentation

## 2017-10-02 DIAGNOSIS — M75102 Unspecified rotator cuff tear or rupture of left shoulder, not specified as traumatic: Secondary | ICD-10-CM | POA: Diagnosis not present

## 2017-10-02 DIAGNOSIS — R9431 Abnormal electrocardiogram [ECG] [EKG]: Secondary | ICD-10-CM | POA: Diagnosis not present

## 2017-10-02 DIAGNOSIS — E785 Hyperlipidemia, unspecified: Secondary | ICD-10-CM | POA: Diagnosis present

## 2017-10-02 DIAGNOSIS — I739 Peripheral vascular disease, unspecified: Secondary | ICD-10-CM | POA: Insufficient documentation

## 2017-10-02 NOTE — Patient Instructions (Signed)
Your procedure is scheduled on: Monday 10/08/17 Report to [place]. To find out your arrival time please call [phone] between 1PM - 3PM on Friday 10/05/17.  Remember: Instructions that are not followed completely may result in serious medical risk, up to and including death, or upon the discretion of your surgeon and anesthesiologist your surgery may need to be rescheduled.     _X__ 1. Do not eat food after midnight the night before your procedure.                 No gum chewing or hard candies. You may drink clear liquids up to 2 hours                 before you are scheduled to arrive for your surgery- DO not drink clear                 liquids within 2 hours of the start of your surgery.                 Clear Liquids include:  water, apple juice without pulp, clear carbohydrate                 drink such as Clearfast or Gatorade, Black Coffee or Tea (Do not add                 anything to coffee or tea).  __X__2.  On the morning of surgery brush your teeth with toothpaste and water, you                 may rinse your mouth with mouthwash if you wish.  Do not swallow any              toothpaste of mouthwash.     _X__ 3.  No Alcohol for 24 hours before or after surgery.   _X__ 4.  Do Not Smoke or use e-cigarettes For 24 Hours Prior to Your Surgery.                 Do not use any chewable tobacco products for at least 6 hours prior to                 surgery.  ____  5.  Bring all medications with you on the day of surgery if instructed.   __X__  6.  Notify your doctor if there is any change in your medical condition      (cold, fever, infections).     Do not wear jewelry, make-up, hairpins, clips or nail polish. Do not wear lotions, powders, or perfumes.  Do not shave 48 hours prior to surgery. Men may shave face and neck. Do not bring valuables to the hospital.    [facility] is not responsible for any belongings or  valuables.  Contacts, dentures/partials or body piercings may not be worn into surgery. Bring a case for your contacts, glasses or hearing aids, a denture cup will be supplied. Leave your suitcase in the car. After surgery it may be brought to your room. For patients admitted to the hospital, discharge time is determined by your treatment team.   Patients discharged the day of surgery will not be allowed to drive home.   Please read over the following fact sheets that you were given:   MRSA Information  __X__ Take these medicines the morning of surgery with A SIP OF WATER:  1. DULOXETINE  2. MONTELUKAST  3.   4.  5.  6.  ____ Fleet Enema (as directed)   __X__ Use CHG Soap/SAGE wipes as directed  ____ Use inhalers on the day of surgery  ____ Stop metformin/Janumet/Farxiga 2 days prior to surgery    ____ Take 1/2 of usual insulin dose the night before surgery. No insulin the morning          of surgery.   __X__ Stop Blood Thinners Coumadin/Plavix/Xarelto/Pleta/Pradaxa/Eliquis/Effient/Aspirin  on   Or contact your Surgeon, Cardiologist or Medical Doctor regarding  ability to stop your blood thinners  __X__ Stop Anti-inflammatories 7 days before surgery such as Advil, Ibuprofen, Motrin,  BC or Goodies Powder, Naprosyn, Naproxen, Aleve, Aspirin AND MELOXICAM   __X__ Stop all herbal supplements, fish oil or vitamin E until after surgery.    ____ Bring C-Pap to the hospital.

## 2017-10-07 MED ORDER — CLINDAMYCIN PHOSPHATE 900 MG/50ML IV SOLN
900.0000 mg | Freq: Once | INTRAVENOUS | Status: AC
  Administered 2017-10-08: 900 mg via INTRAVENOUS

## 2017-10-08 ENCOUNTER — Ambulatory Visit: Payer: Medicare HMO | Admitting: Certified Registered"

## 2017-10-08 ENCOUNTER — Other Ambulatory Visit: Payer: Self-pay

## 2017-10-08 ENCOUNTER — Ambulatory Visit
Admission: RE | Admit: 2017-10-08 | Discharge: 2017-10-08 | Disposition: A | Discharge status: Home / Self Care | Payer: Medicare HMO | Source: Ambulatory Visit | Attending: Orthopedic Surgery | Admitting: Orthopedic Surgery

## 2017-10-08 ENCOUNTER — Encounter: Payer: Self-pay | Admitting: *Deleted

## 2017-10-08 ENCOUNTER — Encounter
Admission: RE | Disposition: A | Discharge status: Home / Self Care | Payer: Self-pay | Source: Ambulatory Visit | Attending: Orthopedic Surgery

## 2017-10-08 DIAGNOSIS — F17210 Nicotine dependence, cigarettes, uncomplicated: Secondary | ICD-10-CM | POA: Diagnosis not present

## 2017-10-08 DIAGNOSIS — M19012 Primary osteoarthritis, left shoulder: Secondary | ICD-10-CM | POA: Insufficient documentation

## 2017-10-08 DIAGNOSIS — Z7951 Long term (current) use of inhaled steroids: Secondary | ICD-10-CM | POA: Diagnosis not present

## 2017-10-08 DIAGNOSIS — Z8249 Family history of ischemic heart disease and other diseases of the circulatory system: Secondary | ICD-10-CM | POA: Insufficient documentation

## 2017-10-08 DIAGNOSIS — Z791 Long term (current) use of non-steroidal anti-inflammatories (NSAID): Secondary | ICD-10-CM | POA: Diagnosis not present

## 2017-10-08 DIAGNOSIS — I739 Peripheral vascular disease, unspecified: Secondary | ICD-10-CM | POA: Insufficient documentation

## 2017-10-08 DIAGNOSIS — Z79899 Other long term (current) drug therapy: Secondary | ICD-10-CM | POA: Insufficient documentation

## 2017-10-08 DIAGNOSIS — M7522 Bicipital tendinitis, left shoulder: Secondary | ICD-10-CM | POA: Diagnosis present

## 2017-10-08 DIAGNOSIS — M75112 Incomplete rotator cuff tear or rupture of left shoulder, not specified as traumatic: Secondary | ICD-10-CM | POA: Insufficient documentation

## 2017-10-08 DIAGNOSIS — I10 Essential (primary) hypertension: Secondary | ICD-10-CM | POA: Insufficient documentation

## 2017-10-08 DIAGNOSIS — Z7902 Long term (current) use of antithrombotics/antiplatelets: Secondary | ICD-10-CM | POA: Diagnosis not present

## 2017-10-08 DIAGNOSIS — E785 Hyperlipidemia, unspecified: Secondary | ICD-10-CM | POA: Insufficient documentation

## 2017-10-08 DIAGNOSIS — M25812 Other specified joint disorders, left shoulder: Secondary | ICD-10-CM | POA: Insufficient documentation

## 2017-10-08 DIAGNOSIS — F419 Anxiety disorder, unspecified: Secondary | ICD-10-CM | POA: Diagnosis not present

## 2017-10-08 DIAGNOSIS — G629 Polyneuropathy, unspecified: Secondary | ICD-10-CM | POA: Insufficient documentation

## 2017-10-08 DIAGNOSIS — Z79890 Hormone replacement therapy (postmenopausal): Secondary | ICD-10-CM | POA: Diagnosis not present

## 2017-10-08 DIAGNOSIS — Z7982 Long term (current) use of aspirin: Secondary | ICD-10-CM | POA: Insufficient documentation

## 2017-10-08 DIAGNOSIS — M75122 Complete rotator cuff tear or rupture of left shoulder, not specified as traumatic: Secondary | ICD-10-CM | POA: Diagnosis present

## 2017-10-08 HISTORY — PX: SHOULDER OPEN ROTATOR CUFF REPAIR: SHX2407

## 2017-10-08 HISTORY — PX: SHOULDER ARTHROSCOPY WITH SUBACROMIAL DECOMPRESSION: SHX5684

## 2017-10-08 HISTORY — PX: BICEPT TENODESIS: SHX5116

## 2017-10-08 HISTORY — PX: RESECTION DISTAL CLAVICAL: SHX5053

## 2017-10-08 SURGERY — SHOULDER ARTHROSCOPY WITH SUBACROMIAL DECOMPRESSION
Anesthesia: General | Site: Shoulder | Laterality: Left | Wound class: Clean

## 2017-10-08 MED ORDER — BUPIVACAINE HCL (PF) 0.5 % IJ SOLN
INTRAMUSCULAR | Status: DC | PRN
  Administered 2017-10-08: 6 mL

## 2017-10-08 MED ORDER — LIDOCAINE-EPINEPHRINE 1 %-1:100000 IJ SOLN
INTRAMUSCULAR | Status: AC
  Filled 2017-10-08: qty 1

## 2017-10-08 MED ORDER — FENTANYL CITRATE (PF) 100 MCG/2ML IJ SOLN: 25.0000 ug | INTRAMUSCULAR | Status: DC | PRN

## 2017-10-08 MED ORDER — ONDANSETRON HCL 4 MG/2ML IJ SOLN
INTRAMUSCULAR | Status: AC
  Filled 2017-10-08: qty 2

## 2017-10-08 MED ORDER — LACTATED RINGERS IV SOLN
INTRAVENOUS | Status: DC
  Administered 2017-10-08 (×2): via INTRAVENOUS

## 2017-10-08 MED ORDER — PROPOFOL 10 MG/ML IV BOLUS
INTRAVENOUS | Status: AC
  Filled 2017-10-08: qty 20

## 2017-10-08 MED ORDER — FENTANYL CITRATE (PF) 100 MCG/2ML IJ SOLN
INTRAMUSCULAR | Status: DC | PRN
  Administered 2017-10-08: 100 ug via INTRAVENOUS

## 2017-10-08 MED ORDER — EPHEDRINE SULFATE 50 MG/ML IJ SOLN
INTRAMUSCULAR | Status: DC | PRN
  Administered 2017-10-08 (×3): 10 mg via INTRAVENOUS

## 2017-10-08 MED ORDER — SUGAMMADEX SODIUM 200 MG/2ML IV SOLN
INTRAVENOUS | Status: AC
  Filled 2017-10-08: qty 2

## 2017-10-08 MED ORDER — ROCURONIUM BROMIDE 100 MG/10ML IV SOLN
INTRAVENOUS | Status: DC | PRN
  Administered 2017-10-08: 50 mg via INTRAVENOUS

## 2017-10-08 MED ORDER — EPINEPHRINE PF 1 MG/ML IJ SOLN
INTRAMUSCULAR | Status: DC | PRN
  Administered 2017-10-08: 4 mL

## 2017-10-08 MED ORDER — SODIUM CHLORIDE 0.9 % IJ SOLN
INTRAMUSCULAR | Status: AC
  Filled 2017-10-08: qty 10

## 2017-10-08 MED ORDER — LIDOCAINE HCL (PF) 1 % IJ SOLN
INTRAMUSCULAR | Status: DC | PRN
  Administered 2017-10-08: 3 mL

## 2017-10-08 MED ORDER — PHENYLEPHRINE HCL 10 MG/ML IJ SOLN
INTRAMUSCULAR | Status: AC
  Filled 2017-10-08: qty 1

## 2017-10-08 MED ORDER — DEXAMETHASONE SODIUM PHOSPHATE 10 MG/ML IJ SOLN
INTRAMUSCULAR | Status: DC | PRN
  Administered 2017-10-08: 10 mg via INTRAVENOUS

## 2017-10-08 MED ORDER — EPHEDRINE SULFATE 50 MG/ML IJ SOLN
INTRAMUSCULAR | Status: AC
  Filled 2017-10-08: qty 1

## 2017-10-08 MED ORDER — GLYCOPYRROLATE 0.2 MG/ML IJ SOLN
INTRAMUSCULAR | Status: DC | PRN
  Administered 2017-10-08: 0.1 mg via INTRAVENOUS

## 2017-10-08 MED ORDER — LIDOCAINE-EPINEPHRINE 1 %-1:100000 IJ SOLN
INTRAMUSCULAR | Status: DC | PRN
  Administered 2017-10-08: 6 mL

## 2017-10-08 MED ORDER — ACETAMINOPHEN 10 MG/ML IV SOLN
INTRAVENOUS | Status: DC | PRN
  Administered 2017-10-08: 1000 mg via INTRAVENOUS

## 2017-10-08 MED ORDER — BUPIVACAINE HCL (PF) 0.5 % IJ SOLN
INTRAMUSCULAR | Status: DC | PRN
  Administered 2017-10-08: 10 mL via PERINEURAL

## 2017-10-08 MED ORDER — FAMOTIDINE 20 MG PO TABS
20.0000 mg | ORAL_TABLET | Freq: Once | ORAL | Status: AC
  Administered 2017-10-08: 20 mg via ORAL

## 2017-10-08 MED ORDER — HYDROMORPHONE HCL 1 MG/ML IJ SOLN
INTRAMUSCULAR | Status: DC | PRN
  Administered 2017-10-08: 1 mg via INTRAVENOUS

## 2017-10-08 MED ORDER — PROPOFOL 10 MG/ML IV BOLUS
INTRAVENOUS | Status: DC | PRN
  Administered 2017-10-08: 150 mg via INTRAVENOUS
  Administered 2017-10-08: 50 mg via INTRAVENOUS

## 2017-10-08 MED ORDER — MIDAZOLAM HCL 2 MG/2ML IJ SOLN
INTRAMUSCULAR | Status: AC
  Administered 2017-10-08: 1 mg via INTRAVENOUS
  Filled 2017-10-08: qty 2

## 2017-10-08 MED ORDER — FENTANYL CITRATE (PF) 100 MCG/2ML IJ SOLN
50.0000 ug | Freq: Once | INTRAMUSCULAR | Status: AC
  Administered 2017-10-08: 50 ug via INTRAVENOUS

## 2017-10-08 MED ORDER — FAMOTIDINE 20 MG PO TABS
ORAL_TABLET | ORAL | Status: AC
  Administered 2017-10-08: 20 mg via ORAL
  Filled 2017-10-08: qty 1

## 2017-10-08 MED ORDER — PROMETHAZINE HCL 25 MG/ML IJ SOLN
6.2500 mg | INTRAMUSCULAR | Status: DC | PRN
Start: 2017-10-08 — End: 2017-10-08

## 2017-10-08 MED ORDER — SUGAMMADEX SODIUM 200 MG/2ML IV SOLN
INTRAVENOUS | Status: DC | PRN
  Administered 2017-10-08: 150 mg via INTRAVENOUS

## 2017-10-08 MED ORDER — OXYCODONE HCL 5 MG PO TABS: 5.0000 mg | ORAL_TABLET | Freq: Once | ORAL | Status: DC | PRN

## 2017-10-08 MED ORDER — ACETAMINOPHEN 500 MG PO TABS: 1000.0000 mg | ORAL_TABLET | Freq: Three times a day (TID) | ORAL | 2 refills | Status: AC

## 2017-10-08 MED ORDER — LIDOCAINE HCL (PF) 2 % IJ SOLN
INTRAMUSCULAR | Status: AC
  Filled 2017-10-08: qty 10

## 2017-10-08 MED ORDER — LABETALOL HCL 5 MG/ML IV SOLN
INTRAVENOUS | Status: AC
  Filled 2017-10-08: qty 4

## 2017-10-08 MED ORDER — EPINEPHRINE 30 MG/30ML IJ SOLN
INTRAMUSCULAR | Status: AC
  Filled 2017-10-08: qty 1

## 2017-10-08 MED ORDER — FENTANYL CITRATE (PF) 100 MCG/2ML IJ SOLN
INTRAMUSCULAR | Status: AC
  Filled 2017-10-08: qty 2

## 2017-10-08 MED ORDER — ONDANSETRON 4 MG PO TBDP: 4.0000 mg | ORAL_TABLET | Freq: Three times a day (TID) | ORAL | 0 refills | Status: DC | PRN

## 2017-10-08 MED ORDER — MIDAZOLAM HCL 2 MG/2ML IJ SOLN
INTRAMUSCULAR | Status: AC
  Filled 2017-10-08: qty 2

## 2017-10-08 MED ORDER — ACETAMINOPHEN 10 MG/ML IV SOLN
INTRAVENOUS | Status: AC
  Filled 2017-10-08: qty 100

## 2017-10-08 MED ORDER — BUPIVACAINE LIPOSOME 1.3 % IJ SUSP
INTRAMUSCULAR | Status: DC | PRN
  Administered 2017-10-08: 20 mL via PERINEURAL

## 2017-10-08 MED ORDER — ASPIRIN EC 325 MG PO TBEC: 325.0000 mg | DELAYED_RELEASE_TABLET | Freq: Every day | ORAL | 0 refills | Status: AC

## 2017-10-08 MED ORDER — OXYCODONE HCL 5 MG/5ML PO SOLN: 5.0000 mg | Freq: Once | ORAL | Status: DC | PRN

## 2017-10-08 MED ORDER — OXYCODONE HCL 5 MG PO TABS: 5.0000 mg | ORAL_TABLET | ORAL | 0 refills | Status: AC | PRN

## 2017-10-08 MED ORDER — LIDOCAINE HCL (PF) 1 % IJ SOLN
INTRAMUSCULAR | Status: AC
  Filled 2017-10-08: qty 5

## 2017-10-08 MED ORDER — ROCURONIUM BROMIDE 50 MG/5ML IV SOLN
INTRAVENOUS | Status: AC
  Filled 2017-10-08: qty 1

## 2017-10-08 MED ORDER — MIDAZOLAM HCL 2 MG/2ML IJ SOLN
1.0000 mg | Freq: Once | INTRAMUSCULAR | Status: AC
  Administered 2017-10-08: 1 mg via INTRAVENOUS

## 2017-10-08 MED ORDER — LIDOCAINE HCL (CARDIAC) 20 MG/ML IV SOLN
INTRAVENOUS | Status: DC | PRN
  Administered 2017-10-08: 50 mg via INTRAVENOUS

## 2017-10-08 MED ORDER — MIDAZOLAM HCL 2 MG/2ML IJ SOLN
INTRAMUSCULAR | Status: DC | PRN
  Administered 2017-10-08: 2 mg via INTRAVENOUS

## 2017-10-08 MED ORDER — PHENYLEPHRINE HCL 10 MG/ML IJ SOLN
INTRAMUSCULAR | Status: DC | PRN
  Administered 2017-10-08: 25 ug/min via INTRAVENOUS

## 2017-10-08 MED ORDER — HYDROMORPHONE HCL 1 MG/ML IJ SOLN
INTRAMUSCULAR | Status: AC
  Filled 2017-10-08: qty 1

## 2017-10-08 MED ORDER — GLYCOPYRROLATE 0.2 MG/ML IJ SOLN
INTRAMUSCULAR | Status: AC
  Filled 2017-10-08: qty 1

## 2017-10-08 MED ORDER — BUPIVACAINE HCL (PF) 0.5 % IJ SOLN
INTRAMUSCULAR | Status: AC
  Filled 2017-10-08: qty 30

## 2017-10-08 MED ORDER — FENTANYL CITRATE (PF) 100 MCG/2ML IJ SOLN
INTRAMUSCULAR | Status: AC
  Administered 2017-10-08: 50 ug via INTRAVENOUS
  Filled 2017-10-08: qty 2

## 2017-10-08 MED ORDER — ONDANSETRON HCL 4 MG/2ML IJ SOLN
INTRAMUSCULAR | Status: DC | PRN
  Administered 2017-10-08: 4 mg via INTRAVENOUS

## 2017-10-08 MED ORDER — BUPIVACAINE HCL (PF) 0.5 % IJ SOLN
INTRAMUSCULAR | Status: AC
  Filled 2017-10-08: qty 10

## 2017-10-08 MED ORDER — ONDANSETRON 4 MG PO TBDP: 4.0000 mg | ORAL_TABLET | Freq: Once | ORAL | Status: DC

## 2017-10-08 MED ORDER — DEXAMETHASONE SODIUM PHOSPHATE 10 MG/ML IJ SOLN
INTRAMUSCULAR | Status: AC
  Filled 2017-10-08: qty 1

## 2017-10-08 MED ORDER — BUPIVACAINE LIPOSOME 1.3 % IJ SUSP
INTRAMUSCULAR | Status: AC
  Filled 2017-10-08: qty 20

## 2017-10-08 MED ORDER — MEPERIDINE HCL 50 MG/ML IJ SOLN: 6.2500 mg | INTRAMUSCULAR | Status: DC | PRN

## 2017-10-08 MED ORDER — CLINDAMYCIN PHOSPHATE 900 MG/50ML IV SOLN
INTRAVENOUS | Status: AC
  Filled 2017-10-08: qty 50

## 2017-10-08 SURGICAL SUPPLY — 82 items
ADAPTER IRRIG TUBE 2 SPIKE SOL (ADAPTER) ×10 IMPLANT
ANCHOR ALL-SUT Q-FIX 2.8 (Anchor) ×5 IMPLANT
ANCHOR SUT BIO SW 4.75X19.1 (Anchor) ×10 IMPLANT
ANCHOR SUT FBRTK SUTURETAP 1.3 (Anchor) IMPLANT
BIT DRILL RIGD1.8MM FBRTK STRL (DRILL) ×3 IMPLANT
BLADE OSCILLATING/SAGITTAL (BLADE)
BLADE SW THK.38XMED LNG THN (BLADE) IMPLANT
BUR BR 5.5 12 FLUTE (BURR) ×5 IMPLANT
BUR RADIUS 4.0X18.5 (BURR) ×5 IMPLANT
CANNULA 5.75X7 CRYSTAL CLEAR (CANNULA) ×5 IMPLANT
CANNULA PARTIAL THREAD 2X7 (CANNULA) IMPLANT
CANNULA TWIST IN 8.25X9CM (CANNULA) IMPLANT
CHLORAPREP W/TINT 26ML (MISCELLANEOUS) ×5 IMPLANT
CLOSURE WOUND 1/2 X4 (GAUZE/BANDAGES/DRESSINGS) ×1
COOLER POLAR GLACIER W/PUMP (MISCELLANEOUS) ×5 IMPLANT
CRADLE LAMINECT ARM (MISCELLANEOUS) ×5 IMPLANT
DRAPE IMP U-DRAPE 54X76 (DRAPES) ×10 IMPLANT
DRAPE INCISE IOBAN 66X45 STRL (DRAPES) ×5 IMPLANT
DRAPE SHEET LG 3/4 BI-LAMINATE (DRAPES) ×5 IMPLANT
DRAPE STERI 35X30 U-POUCH (DRAPES) ×5 IMPLANT
DRAPE U-SHAPE 47X51 STRL (DRAPES) ×10 IMPLANT
DRILL RIGID 1.8MM FBRTK STRL (DRILL) ×5
DRSG OPSITE POSTOP 3X4 (GAUZE/BANDAGES/DRESSINGS) ×5 IMPLANT
DRSG TEGADERM 4X4.75 (GAUZE/BANDAGES/DRESSINGS) ×10 IMPLANT
ELECT REM PT RETURN 9FT ADLT (ELECTROSURGICAL) ×5
ELECTRODE REM PT RTRN 9FT ADLT (ELECTROSURGICAL) ×3 IMPLANT
GAUZE PETRO XEROFOAM 1X8 (MISCELLANEOUS) ×5 IMPLANT
GAUZE SPONGE 4X4 12PLY STRL (GAUZE/BANDAGES/DRESSINGS) ×5 IMPLANT
GAUZE XEROFORM 4X4 STRL (GAUZE/BANDAGES/DRESSINGS) ×15 IMPLANT
GLOVE BIOGEL PI IND STRL 8 (GLOVE) ×3 IMPLANT
GLOVE BIOGEL PI INDICATOR 8 (GLOVE) ×2
GLOVE SURG SYN 8.0 (GLOVE) ×5 IMPLANT
GOWN STRL REUS W/ TWL LRG LVL3 (GOWN DISPOSABLE) ×3 IMPLANT
GOWN STRL REUS W/TWL LRG LVL3 (GOWN DISPOSABLE) ×2
GOWN STRL REUS W/TWL LRG LVL4 (GOWN DISPOSABLE) ×5 IMPLANT
IV LACTATED RINGER IRRG 3000ML (IV SOLUTION) ×8
IV LR IRRIG 3000ML ARTHROMATIC (IV SOLUTION) ×12 IMPLANT
KIT SPEAR STR 1.6MM DRILL (MISCELLANEOUS) ×5 IMPLANT
KIT STABILIZATION SHOULDER (MISCELLANEOUS) ×5 IMPLANT
KIT SUTURE 2.8 Q-FIX DISP (MISCELLANEOUS) ×5 IMPLANT
KIT SUTURETAK 3.0 INSERT PERC (KITS) IMPLANT
KIT TURNOVER KIT A (KITS) ×5 IMPLANT
MANIFOLD NEPTUNE II (INSTRUMENTS) ×5 IMPLANT
MASK FACE SPIDER DISP (MASK) ×5 IMPLANT
MAT ABSORB  FLUID 56X50 GRAY (MISCELLANEOUS) ×4
MAT ABSORB FLUID 56X50 GRAY (MISCELLANEOUS) ×6 IMPLANT
NDL MAYO CATGUT SZ5 (NEEDLE) ×2
NDL SAFETY ECLIPSE 18X1.5 (NEEDLE) ×3 IMPLANT
NDL SUT 5 .5 CRC TPR PNT MAYO (NEEDLE) ×3 IMPLANT
NEEDLE HYPO 18GX1.5 SHARP (NEEDLE) ×2
NEEDLE HYPO 22GX1.5 SAFETY (NEEDLE) ×5 IMPLANT
NEEDLE SCORPION MULTI FIRE (NEEDLE) ×5 IMPLANT
NEEDLE SPNL 18GX3.5 QUINCKE PK (NEEDLE) ×10 IMPLANT
PACK ARTHROSCOPY SHOULDER (MISCELLANEOUS) ×5 IMPLANT
PAD ABD DERMACEA PRESS 5X9 (GAUZE/BANDAGES/DRESSINGS) ×5 IMPLANT
PAD WRAPON POLAR SHDR XLG (MISCELLANEOUS) ×3 IMPLANT
SET TUBE SUCT SHAVER OUTFL 24K (TUBING) ×5 IMPLANT
SET TUBE TIP INTRA-ARTICULAR (MISCELLANEOUS) ×5 IMPLANT
SLEEVE PROTECTION STRL DISP (MISCELLANEOUS) ×5 IMPLANT
SLING ULTRA II M (MISCELLANEOUS) ×5 IMPLANT
SPONGE GAUZE 2X2 8PLY STER LF (GAUZE/BANDAGES/DRESSINGS) ×1
SPONGE GAUZE 2X2 8PLY STRL LF (GAUZE/BANDAGES/DRESSINGS) ×4 IMPLANT
STAPLER SKIN PROX 35W (STAPLE) IMPLANT
STRAP SAFETY 5IN WIDE (MISCELLANEOUS) ×5 IMPLANT
STRIP CLOSURE SKIN 1/2X4 (GAUZE/BANDAGES/DRESSINGS) ×4 IMPLANT
SUT ETHILON 3-0 (SUTURE) ×5 IMPLANT
SUT LASSO 90 DEG SD STR (SUTURE) IMPLANT
SUT MNCRL 4-0 (SUTURE)
SUT MNCRL 4-0 27XMFL (SUTURE)
SUT PROLENE 0 CT 2 (SUTURE) IMPLANT
SUT VIC AB 0 CT1 36 (SUTURE) IMPLANT
SUT VIC AB 2-0 CT2 27 (SUTURE) IMPLANT
SUT VICRYL 3-0 27IN (SUTURE) IMPLANT
SUTURE MNCRL 4-0 27XMF (SUTURE) IMPLANT
SYR 10ML LL (SYRINGE) ×5 IMPLANT
TAPE CLOTH 3X10 WHT NS LF (GAUZE/BANDAGES/DRESSINGS) ×5 IMPLANT
TAPE MICROFOAM 4IN (TAPE) ×5 IMPLANT
TUBING ARTHRO INFLOW-ONLY STRL (TUBING) ×5 IMPLANT
TUBING CONNECTING 10 (TUBING) IMPLANT
TUBING CONNECTING 10' (TUBING)
WAND HAND CNTRL MULTIVAC 90 (MISCELLANEOUS) ×5 IMPLANT
WRAPON POLAR PAD SHDR XLG (MISCELLANEOUS) ×5

## 2017-10-08 NOTE — H&P (Signed)
Paper H&P to be scanned into permanent record. H&P reviewed. No significant changes noted.  

## 2017-10-08 NOTE — Anesthesia Procedure Notes (Signed)
Anesthesia Regional Block: Interscalene brachial plexus block   Pre-Anesthetic Checklist: ,, timeout performed, Correct Patient, Correct Site, Correct Laterality, Correct Procedure, Correct Position, site marked, Risks and benefits discussed,  Surgical consent,  Pre-op evaluation,  At surgeon's request and post-op pain management  Laterality: Left  Prep: chloraprep       Needles:  Injection technique: Single-shot  Needle Type: Stimiplex     Needle Length: 9cm  Needle Gauge: 21     Additional Needles:   Procedures:,,,, ultrasound used (permanent image in chart),,,,  Narrative:  Start time: 10/08/2017 7:15 AM End time: 10/08/2017 7:20 AM Injection made incrementally with aspirations every 5 mL.  Performed by: Personally  Anesthesiologist: Alver FisherPenwarden, Conner Neiss, MD  Additional Notes: Functioning IV was confirmed and monitors were applied.  A Stimuplex needle was used. Sterile prep and drape,hand hygiene and sterile gloves were used.  Negative aspiration and negative test dose prior to incremental administration of local anesthetic. The patient tolerated the procedure well.

## 2017-10-08 NOTE — Anesthesia Post-op Follow-up Note (Signed)
Anesthesia QCDR form completed.        

## 2017-10-08 NOTE — Op Note (Signed)
SURGERY DATE: 10/08/2017  PRE-OP DIAGNOSIS:  1. Left subacromial impingement 2. Left biceps tendinopathy 3. Left rotator cuff tear 4. Left acromioclavicular joint osteoarthritis  POST-OP DIAGNOSIS: 1. Left subacromial impingement 2. Left biceps tendinopathy 3. Left rotator cuff tear 4. Left acromioclavicular joint osteoarthritis  PROCEDURES:  1. Left mini-open rotator cuff repair 2. Left open biceps tenodesis 3. Left arthroscopic distal clavicle excision 4. Left arthroscopic extensive debridement of shoulder (glenohumeral and subacromial spaces) 5. Left arthroscopic subacromial decompression  SURGEON: Rosealee Albee, MD  ANESTHESIA: Gen with Exparil interscalene block  ESTIMATED BLOOD LOSS: 25cc  DRAINS:  none  TOTAL IV FLUIDS: per anesthesia   SPECIMENS: none  IMPLANTS:  - Arthrex 4.88mm SwiveLock - x2 - [employer] Q-fix anchor - x1  OPERATIVE FINDINGS:  Examination under anesthesia: A careful examination under anesthesia was performed.  Passive range of motion was: FF: 170; ER at side: 50; ER in abduction: 100; IR in abduction: 50.  Anterior load shift: NT.  Posterior load shift: NT.  Sulcus in neutral: NT.  Sulcus in ER: NT.    Intra-operative findings: A thorough arthroscopic examination of the shoulder was performed.  The findings are: 1. Biceps tendon: significant tendinopathy with significant erythema at the anchor and distally in the groove 2. Superior labrum: injected with surrounding synovitis 3. Posterior labrum and capsule: normal 4. Inferior capsule and inferior recess: normal 5. Glenoid cartilage surface: normal 6. Supraspinatus attachment: significant fraying and partial thickness articular sided-tear. On examination of the bursal surface, there was ~75% bursal sided tear as well anteriorly.  7. Posterior rotator cuff attachment: normal 8. Humeral head articular cartilage: normal 9. Rotator interval: significant synovitis 10: Subscapularis tendon:  attachment intact 11. Anterior labrum: degenerative 12. IGHL: normal  OPERATIVE REPORT:   Indications for procedure: Bethany Lozano is a 67 y.o. year old female with ~1.5 years of L shoulder pain that has worsened significantly recently. She has failed non-operative management including activity modification, physical therapy, and corticosteroid injection. Clinical exam and MRI were suggestive rotator cuff tear, biceps tendinopathy, subacromial impingement, and acromioclavicular joint arthritis. After discussion of risks, benefits, and alternatives to surgery, the patient elected to proceed. She understands she is at higher perioperative risk due to her medical co-morbidities and at increased risk of rotator cuff not healing due to smoking.   Procedure in detail:  I identified Bethany Lozano in the pre-operative holding area.  I marked the operative shoulder with my initials. I reviewed the risks and benefits of the proposed surgical intervention, and the patient (and/or patient's guardian) wished to proceed.  Anesthesia was then performed with an Exparil interscalene block.  The patient was transferred to the operative suite and placed in the beach chair position.    SCDs were placed on the lower extremities. Appropriate IV antibiotics were administered prior to incision. The operative upper extremity was then prepped and draped in standard fashion. A time out was performed confirming the correct extremity, correct patient, and correct procedure.   I then created a standard posterior portal with an 11 blade. The glenohumeral joint was easily entered with a blunt trochar and the arthroscope introduced. The findings of diagnostic arthroscopy are described above. I debrided degenerative tissue including the synovitic tissue about the rotator interval and anterior and superior labrum. I then coagulated the inflamed synovium to obtain hemostasis and reduce the risk of post-operative swelling using an  Arthrocare radiofrequency device. I performed a biceps tenotomy using an arthroscopic scissors and used a  motorized shaver to debride the stump back to a stable base.   Next, the arthroscope was then introduced into the subacromial space. A direct lateral portal was created with an 11-blade after spinal needle localization. An extensive subacromial bursectomy was performed using a combination of the shaver and Arthrocare wand. The entire acromial undersurface was exposed and the CA ligament was subperiosteally elevated to expose the anterior acromial hook. A 5.5mm barrel burr was used to create a flat anterior and lateral aspect of the acromion, converting it from a Type 2 to a Type 1 acromion. Care was made to keep the deltoid fascia intact.  I then turned my attention to the arthroscopic distal clavicle excision. I identified the acromioclavicular joint. Surrounding bursal tissue was debrided and the edges of the joint were identified. I used the 5.82mm barrel burr to remove the distal clavicle parallel to the edge of the acromion. I was able to fit two widths of the burr into the space between the distal clavicle and acromion, signifying that I had removed ~37mm of distal clavicle. This was confirmed by viewing anteriorly and introducing a probe with measuring marks from the lateral portal. Hemostasis was achieved with an Arthrocare wand. The rotator cuff tear was identified. Fluid was evacuated from the shoulder.   A longitudinal incision from the anterolateral acromion ~6cm in length was made overlying the raphe between the anterior and middle heads of the deltoid. The raphe was identified and it was incised. The subacromial space was identified. Any remaining bursa was excised. The rotator cuff tear was identified. It was a small U-shaped tear of the supraspinatuswith mild retraction.   We then turned our attention to the biceps tenodesis. The arm was externally rotated.  The bicipital groove was  identified.  A 15 blade was used to make a cut overlying the biceps tendon, and the tendon was removed using a right angle clamp.  The base of the bicipital groove was identified and cleared of soft tissue.  A Q fix anchor was placed in the bicipital groove.  The biceps tendon was held at the appropriate amount of tension.  One set of sutures was passed through the biceps anchor with one limb passed in a simple fashion and the second limb passed in a simple plus locking stitch pattern.  This was repeated for the other set of sutures.  This construct allowed for shuttling the biceps tendon down to the bone.  The sutures were tied and cut.  The diseased portion of the proximal biceps was then excised.  The arm was then internally rotated.  The rotator cuff footprint was cleared of soft tissue and roughened with a rasp.  A SwiveLock anchor loaded with tape and suture was placed just lateral to the articular margin.  All 4 strands were passed through the rotator cuff. The suture was tied.  Another SwiveLock anchor was placed for the lateral row with all 4 limbs of suture and tape passed through the anchor.  This allowed for excellent reapproximation with compression of the rotator cuff over its footprint.  The construct was stable with external and internal rotation.  The wound was thoroughly irrigated.  The deltoid split was closed with 0 Vicryl.  The subdermal layer was closed with 3-0 Vicryl.  The skin was closed with 4-0 Monocryl in a running fashion. The portals were closed with 3-0 Nylon. Xeroform was applied to the incisions. A sterile dressing was applied, followed by a Polar Care sleeve and a SlingShot shoulder  immobilizer/sling. The patient was awakened from anesthesia without difficulty and was transferred to the PACU in stable condition.     COMPLICATIONS: none  DISPOSITION: plan for discharge home after recovery in PACU   POSTOPERATIVE PLAN: Remain in sling (except hygiene and elbow/wrist/hand  RoM exercises as instructed by PT) x 6 weeks and NWB for this time. PT to begin 3-4 days after surgery. Rotator cuff repair and biceps tenodesis rehab protocol.

## 2017-10-08 NOTE — Discharge Instructions (Addendum)
Interscalene Nerve Block with Exparel  1.  For your surgery you have received an Interscalene Nerve Block with Exparel. 2. Nerve Blocks affect many types of nerves, including nerves that control movement, pain and normal sensation.  You may experience feelings such as numbness, tingling, heaviness, weakness or the inability to move your arm or the feeling or sensation that your arm has "fallen asleep". 3. A nerve block with Exparel can last up to 5 days.  Usually the weakness wears off first.  The tingling and heaviness usually wear off next.  Finally you may start to notice pain.  Keep in mind that this may occur in any order.  Once a nerve block starts to wear off it is usually completely gone within 60 minutes. 4. ISNB may cause mild shortness of breath, a hoarse voice, blurry vision, unequal pupils, or drooping of the face on the same side as the nerve block.  These symptoms will usually resolve with the numbness.  Very rarely the procedure itself can cause mild seizures. 5. If needed, your surgeon will give you a prescription for pain medication.  It will take about 60 minutes for the oral pain medication to become fully effective.  So, it is recommended that you start taking this medication before the nerve block first begins to wear off, or when you first begin to feel discomfort. 6. Take your pain medication only as prescribed.  Pain medication can cause sedation and decrease your breathing if you take more than you need for the level of pain that you have. 7. Nausea is a common side effect of many pain medications.  You may want to eat something before taking your pain medicine to prevent nausea. 8. After an Interscalene nerve block, you cannot feel pain, pressure or extremes in temperature in the effected arm.  Because your arm is numb it is at an increased risk for injury.  To decrease the possibility of injury, please practice the following:  a. While you are awake change the position of  your arm frequently to prevent too much pressure on any one area for prolonged periods of time. b.  If you have a cast or tight dressing, check the color or your fingers every couple of hours.  Call your surgeon with the appearance of any discoloration (white or blue). c. If you are given a sling to wear before you go home, please wear it  at all times until the block has completely worn off.  Do not get up at night without your sling. d. Please contact [facility] or your surgeon if you do not begin to regain sensation after 7 days from the surgery.  Anesthesia may be contacted by calling the Same Day Surgery Department, Mon. through Fri., 6 am to 4 pm at [phone].   e. If you experience any other problems or concerns, please contact your surgeon's office. If you experience severe or prolonged shortness of breath go to the nearest emergency department.Post-Op Instructions - Rotator Cuff Repair  1. Bracing: You will wear a shoulder immobilizer or sling for 6 weeks.   2. Driving: No driving for 3 weeks post-op. When driving, do not wear the immobilizer. Ideally, we recommend no driving for 6 weeks while sling is in place as one arm will be immobilized.   3. Activity: No active lifting for 2 months. Wrist, hand, and elbow motion only. Avoid lifting the upper arm away from the body except for hygiene. You are permitted to bend and straighten  the elbow passively only (no active elbow motion). You may use your hand and wrist for typing, writing, and managing utensils (cutting food). Do not lift more than a coffee cup for 8 weeks.  When sleeping or resting, inclined positions (recliner chair or wedge pillow) and a pillow under the forearm for support may provide better comfort for up to 4 weeks.  Avoid long distance travel for 4 weeks.  Return to normal activities after rotator cuff repair repair normally takes 6 months on average. If rehab goes very well, may be able to do most activities at 4  months, except overhead or contact sports.  4. Physical Therapy: Begins 3-4 days after surgery, and proceed 1 time per week for the first 6 weeks, then 1-2 times per week from weeks 6-20 post-op.  5. Medications:  - You will be provided a prescription for narcotic pain medicine. After surgery, take 1-2 narcotic tablets every 4 hours if needed for severe pain.  - A prescription for anti-nausea medication will be provided in case the narcotic medicine causes nausea - take 1 tablet every 6 hours only if nauseated.   - Take tylenol 1000 mg (2 Extra Strength tablets or 3 regular strength) every 8 hours for pain.  May decrease or stop tylenol 5 days after surgery if you are having minimal pain. - Take ASA 325mg /day x 2 weeks to help prevent DVTs/PEs (blood clots). Can resume 81mg  aspirin after 2 weeks. Resume Plavix on day after surgery.  - DO NOT take ANY nonsteroidal anti-inflammatory pain medications (Advil, Motrin, Ibuprofen, Aleve, Naproxen, or Naprosyn). These medicines can inhibit healing of your shoulder repair.    If you are taking prescription medication for anxiety, depression, insomnia, muscle spasm, chronic pain, or for attention deficit disorder, you are advised that you are at a higher risk of adverse effects with use of narcotics post-op, including narcotic addiction/dependence, depressed breathing, death. If you use non-prescribed substances: alcohol, marijuana, cocaine, heroin, methamphetamines, etc., you are at a higher risk of adverse effects with use of narcotics post-op, including narcotic addiction/dependence, depressed breathing, death. You are advised that taking > 50 morphine milligram equivalents (MME) of narcotic pain medication per day results in twice the risk of overdose or death. For your prescription provided: oxycodone 5 mg - taking more than 6 tablets per day would result in > 50 morphine milligram equivalents (MME) of narcotic pain medication. Be advised that we will  prescribe narcotics short-term, for acute post-operative pain only - 3 weeks for major operations such as shoulder repair/reconstruction surgeries.     6. Post-Op Appointment:  Your first post-op appointment will be 10-14 days post-op.  7. Work or School: For most, but not all procedures, we advise staying out of work or school for at least 1 to 2 weeks in order to recover from the stress of surgery and to allow time for healing.   If you need a work or school note this can be provided.   8. Smoking: If you are a smoker, you need to refrain from smoking in the postoperative period. The nicotine in cigarettes will inhibit healing of your shoulder repair and decrease the chance of successful repair. Similarly, nicotine containing products (gum, patches) should be avoided.   Post-operative Brace: Apply and remove the brace you received as you were instructed to at the time of fitting and as described in detail as the braces instructions for use indicate.  Wear the brace for the period of time prescribed by your  physician.  The brace can be cleaned with soap and water and allowed to air dry only.  Should the brace result in increased pain, decreased feeling (numbness/tingling), increased swelling or an overall worsening of your medical condition, please contact your doctor immediately.  If an emergency situation occurs as a result of wearing the brace after normal business hours, please dial 911 and seek immediate medical attention.  Let your doctor know if you have any further questions about the brace issued to you. Refer to the shoulder sling instructions for use if you have any questions regarding the correct fit of your shoulder sling.  [facility] Customer Care for Troubleshooting: [phone]  Video that illustrates how to properly use a shoulder sling: "Instructions for Proper Use of an Orthopaedic Sling" http://bass.com/https://www.youtube.com/watch?v=AHZpn_Xo45w    AMBULATORY SURGERY  DISCHARGE  INSTRUCTIONS   1) The drugs that you were given will stay in your system until tomorrow so for the next 24 hours you should not:  A) Drive an automobile B) Make any legal decisions C) Drink any alcoholic beverage   2) You may resume regular meals tomorrow.  Today it is better to start with liquids and gradually work up to solid foods.  You may eat anything you prefer, but it is better to start with liquids, then soup and crackers, and gradually work up to solid foods.   3) Please notify your doctor immediately if you have any unusual bleeding, trouble breathing, redness and pain at the surgery site, drainage, fever, or pain not relieved by medication.    4) Additional Instructions:        Please contact your physician with any problems or Same Day Surgery at [phone], Monday through Friday 6 am to 4 pm, or [place] at [facility] Main number at [phone].

## 2017-10-08 NOTE — Addendum Note (Signed)
Addendum  created 10/08/17 1340 by Roosevelt Bisher, Catheryn BaconJosephine H, CRNA   Intraprocedure Flowsheets edited

## 2017-10-08 NOTE — Transfer of Care (Signed)
Immediate Anesthesia Transfer of Care Note  Patient: Bethany Lozano  Procedure(s) Performed: SHOULDER ARTHROSCOPY WITH SUBACROMIAL DECOMPRESSION (Left Shoulder) BICEPS TENODESIS (Left ) ROTATOR CUFF REPAIR SHOULDER OPEN Extensive Glenoid Humeral debridement (Left Shoulder) RESECTION DISTAL CLAVICAL (Shoulder)  Patient Location: PACU  Anesthesia Type:General  Level of Consciousness: awake, alert , oriented and patient cooperative  Airway & Oxygen Therapy: Patient Spontanous Breathing and Patient connected to nasal cannula oxygen  Post-op Assessment: Report given to RN, Post -op Vital signs reviewed and stable and Patient moving all extremities  Post vital signs: Reviewed and stable  Last Vitals:  Vitals Value Taken Time  BP 153/66 10/08/2017 11:18 AM  Temp 36.3 C 10/08/2017 11:18 AM  Pulse 86 10/08/2017 11:20 AM  Resp 15 10/08/2017 11:20 AM  SpO2 97 % 10/08/2017 11:20 AM  Vitals shown include unvalidated device data.  Last Pain:  Vitals:   10/08/17 1118  TempSrc:   PainSc: 0-No pain         Complications: No apparent anesthesia complications

## 2017-10-08 NOTE — Anesthesia Postprocedure Evaluation (Signed)
Anesthesia Post Note  Patient: Bethany MorelLaura E Lozano  Procedure(s) Performed: SHOULDER ARTHROSCOPY WITH SUBACROMIAL DECOMPRESSION (Left Shoulder) BICEPS TENODESIS (Left ) ROTATOR CUFF REPAIR SHOULDER OPEN Extensive Glenoid Humeral debridement (Left Shoulder) RESECTION DISTAL CLAVICAL (Shoulder)  Patient location during evaluation: PACU Anesthesia Type: General Level of consciousness: awake and alert and oriented Pain management: pain level controlled Vital Signs Assessment: post-procedure vital signs reviewed and stable Respiratory status: spontaneous breathing, nonlabored ventilation and respiratory function stable Cardiovascular status: blood pressure returned to baseline and stable Postop Assessment: no signs of nausea or vomiting Anesthetic complications: no     Last Vitals:  Vitals:   10/08/17 1206 10/08/17 1234  BP: (!) 143/67 130/71  Pulse: 85 77  Resp: 16   Temp: 36.7 C   SpO2: 92% 96%    Last Pain:  Vitals:   10/08/17 1206  TempSrc:   PainSc: 0-No pain                 Danyella Mcginty

## 2017-10-08 NOTE — Anesthesia Procedure Notes (Signed)
Procedure Name: Intubation Date/Time: 10/08/2017 7:38 AM Performed by: Sherol DadeMacMang, Shakemia Madera H, CRNA Pre-anesthesia Checklist: Patient identified, Emergency Drugs available, Suction available, Patient being monitored and Timeout performed Patient Re-evaluated:Patient Re-evaluated prior to induction Oxygen Delivery Method: Circle system utilized Preoxygenation: Pre-oxygenation with 100% oxygen Induction Type: IV induction and Cricoid Pressure applied Ventilation: Mask ventilation without difficulty Laryngoscope Size: Miller and 2 Grade View: Grade I Tube type: Oral Tube size: 7.0 mm Number of attempts: 1 Airway Equipment and Method: Stylet Placement Confirmation: ETT inserted through vocal cords under direct vision,  positive ETCO2,  CO2 detector and breath sounds checked- equal and bilateral Secured at: 20 cm Tube secured with: Tape Dental Injury: Teeth and Oropharynx as per pre-operative assessment

## 2017-10-08 NOTE — OR Nursing (Signed)
Discharge instructions discussed with pt and friend. Both voice understanding. Additional information given on exparell.

## 2017-10-08 NOTE — Anesthesia Preprocedure Evaluation (Signed)
Anesthesia Evaluation  Patient identified by MRN, date of birth, ID band Patient awake    Reviewed: Allergy & Precautions, NPO status , Patient's Chart, lab work & pertinent test results  History of Anesthesia Complications Negative for: history of anesthetic complications  Airway Mallampati: III  TM Distance: >3 FB Neck ROM: Full    Dental  (+) Poor Dentition   Pulmonary neg sleep apnea, neg COPD, Current Smoker,    breath sounds clear to auscultation- rhonchi (-) wheezing      Cardiovascular hypertension, + Peripheral Vascular Disease  (-) CAD, (-) Past MI, (-) Cardiac Stents and (-) CABG  Rhythm:Regular Rate:Normal - Systolic murmurs and - Diastolic murmurs L heart cath 8/2/955/8/17: Mid LAD to Dist LAD lesion, 30% stenosed   Neuro/Psych PSYCHIATRIC DISORDERS Anxiety Depression negative neurological ROS     GI/Hepatic negative GI ROS, Neg liver ROS,   Endo/Other  negative endocrine ROS  Renal/GU negative Renal ROS     Musculoskeletal negative musculoskeletal ROS (+)   Abdominal (+) - obese,   Peds  Hematology negative hematology ROS (+)   Anesthesia Other Findings Past Medical History: No date: Anxiety No date: Depression No date: Heart murmur No date: Hemorrhoids No date: Hyperlipidemia No date: Peripheral vascular disease (HCC) No date: Sinusitis No date: Tobacco use   Reproductive/Obstetrics                             Anesthesia Physical Anesthesia Plan  ASA: III  Anesthesia Plan: General   Post-op Pain Management:  Regional for Post-op pain   Induction: Intravenous  PONV Risk Score and Plan: 1 and Ondansetron  Airway Management Planned: Oral ETT  Additional Equipment:   Intra-op Plan:   Post-operative Plan: Extubation in OR  Informed Consent: I have reviewed the patients History and Physical, chart, labs and discussed the procedure including the risks, benefits and  alternatives for the proposed anesthesia with the patient or authorized representative who has indicated his/her understanding and acceptance.   Dental advisory given  Plan Discussed with: CRNA and Anesthesiologist  Anesthesia Plan Comments:         Anesthesia Quick Evaluation

## 2018-03-28 ENCOUNTER — Other Ambulatory Visit (INDEPENDENT_AMBULATORY_CARE_PROVIDER_SITE_OTHER): Payer: Self-pay | Admitting: Vascular Surgery

## 2018-03-28 DIAGNOSIS — I70211 Atherosclerosis of native arteries of extremities with intermittent claudication, right leg: Secondary | ICD-10-CM

## 2018-03-29 ENCOUNTER — Ambulatory Visit (INDEPENDENT_AMBULATORY_CARE_PROVIDER_SITE_OTHER): Payer: Medicare HMO

## 2018-03-29 ENCOUNTER — Ambulatory Visit (INDEPENDENT_AMBULATORY_CARE_PROVIDER_SITE_OTHER): Payer: Medicare HMO | Admitting: Vascular Surgery

## 2018-03-29 ENCOUNTER — Encounter (INDEPENDENT_AMBULATORY_CARE_PROVIDER_SITE_OTHER): Payer: Self-pay | Admitting: Vascular Surgery

## 2018-03-29 VITALS — BP 156/77 | HR 63 | Resp 16 | Ht 65.0 in | Wt 139.0 lb

## 2018-03-29 DIAGNOSIS — I6523 Occlusion and stenosis of bilateral carotid arteries: Secondary | ICD-10-CM

## 2018-03-29 DIAGNOSIS — I70211 Atherosclerosis of native arteries of extremities with intermittent claudication, right leg: Secondary | ICD-10-CM

## 2018-03-29 DIAGNOSIS — I10 Essential (primary) hypertension: Secondary | ICD-10-CM | POA: Diagnosis not present

## 2018-03-29 DIAGNOSIS — F17210 Nicotine dependence, cigarettes, uncomplicated: Secondary | ICD-10-CM

## 2018-03-29 DIAGNOSIS — E785 Hyperlipidemia, unspecified: Secondary | ICD-10-CM

## 2018-03-29 DIAGNOSIS — F17200 Nicotine dependence, unspecified, uncomplicated: Secondary | ICD-10-CM

## 2018-03-29 NOTE — Progress Notes (Addendum)
MRN : [id number]  Bethany Lozano is a 67 y.o. (June 30, 1951) female who presents with chief complaint of  Chief Complaint  Patient presents with  . Follow-up    30month ultrasound results  .  History of Present Illness: Patient returns in follow-up of multiple vascular issues.  She is status post right carotid endarterectomy and subsequent right carotid stent back in 2017.  No new focal neurologic symptoms.  She has had some neck pain.  Her duplex shows her right carotid stent to be patent without recurrent stenosis.  Her left carotid stenosis has progressed slightly and is now in the 40 to 59% range although the lower end of that range. Her right leg claudication symptoms are about the same.  Her symptoms are at least moderate and somewhat lifestyle limiting.  She is trying to avoid intervention.  She does continue to smoke and understands that is part of the reason she has the disease. Right ABI is reduced at 0.66 and left ABI is 1.01.  Minimal change from previous studies.   Current Outpatient Medications  Medication Sig Dispense Refill  . acetaminophen (TYLENOL) 500 MG tablet Take 2 tablets (1,000 mg total) by mouth every 8 (eight) hours. 90 tablet 2  . chlorthalidone (HYGROTON) 25 MG tablet Take 12.5 mg by mouth daily.  0  . Cholecalciferol (VITAMIN D3) 5000 units CAPS Take 1,000 Units by mouth daily.     . clobetasol (TEMOVATE) 0.05 % GEL apply topically as needed for rash    . clonazePAM (KLONOPIN) 0.5 MG tablet Take 0.5 mg by mouth at bedtime. May take an additional 0.25 mg during the day as needed for anxiety  0  . clopidogrel (PLAVIX) 75 MG tablet Take 1 tablet (75 mg total) by mouth daily with breakfast. 30 tablet 3  . DULoxetine (CYMBALTA) 30 MG capsule Take 30 mg by mouth daily.     Marland Kitchen estradiol (ESTRACE) 0.5 MG tablet Take 0.5 mg by mouth every morning.  0  . fluticasone (FLONASE) 50 MCG/ACT nasal spray Place 1 spray into both nostrils daily as needed for allergies or rhinitis.    0  . loratadine (CLARITIN) 10 MG tablet Take 10 mg by mouth daily as needed for allergies.    . montelukast (SINGULAIR) 10 MG tablet Take 10 mg by mouth at bedtime.   0  . ondansetron (ZOFRAN ODT) 4 MG disintegrating tablet Take 1 tablet (4 mg total) by mouth every 8 (eight) hours as needed for nausea or vomiting. 20 tablet 0  . PREMARIN 0.3 MG tablet Take 0.3 mg by mouth every evening.   3  . rosuvastatin (CRESTOR) 40 MG tablet Take 20 mg by mouth at bedtime.     Marland Kitchen oxyCODONE (ROXICODONE) 5 MG immediate release tablet Take 1-2 tablets (5-10 mg total) by mouth every 4 (four) hours as needed (pain). (Patient not taking: Reported on 03/29/2018) 45 tablet 0   No current facility-administered medications for this visit.     Past Medical History:  Diagnosis Date  . Anxiety   . Depression   . Heart murmur   . Hemorrhoids   . Hyperlipidemia   . Peripheral vascular disease (HCC)   . Sinusitis   . Tobacco use     Past Surgical History:  Procedure Laterality Date  . ABDOMINAL HYSTERECTOMY  1991  . APPENDECTOMY    . BICEPT TENODESIS Left 10/08/2017   Procedure: BICEPS TENODESIS;  Surgeon: Signa Kell, MD;  Location: [facility];  Service: Orthopedics;  Laterality: Left;  . CARDIAC CATHETERIZATION N/A 11/08/2015   Procedure: Left Heart Cath and Coronary Angiography;  Surgeon: Laurier Nancy, MD;  Location: [facility];  Service: Cardiovascular;  Laterality: N/A;  . CARDIAC CATHETERIZATION     Mynx placed in right artery after heart catherization  . CAROTID STENT INSERTION Right 05/15/2016  . COLONOSCOPY  2014  . ENDARTERECTOMY Right 11/25/2015   Procedure: ENDARTERECTOMY CAROTID;  Surgeon: Annice Needy, MD;  Location: [facility];  Service: Vascular;  Laterality: Right;  . FISSURECTOMY  10/22/14  . HEMORRHOIDECTOMY WITH HEMORRHOID BANDING  1991,06/20/14, 10/22/14  . PERIPHERAL VASCULAR CATHETERIZATION Right 05/15/2016   Procedure: Carotid PTA/Stent Intervention;  Surgeon: Annice Needy, MD;   Location: [facility];  Service: Cardiovascular;  Laterality: Right;  . RESECTION DISTAL CLAVICAL  10/08/2017   Procedure: RESECTION DISTAL CLAVICAL;  Surgeon: Signa Kell, MD;  Location: [facility];  Service: Orthopedics;;  . SHOULDER ARTHROSCOPY WITH SUBACROMIAL DECOMPRESSION Left 10/08/2017   Procedure: SHOULDER ARTHROSCOPY WITH SUBACROMIAL DECOMPRESSION;  Surgeon: Signa Kell, MD;  Location: [facility];  Service: Orthopedics;  Laterality: Left;  . SHOULDER OPEN ROTATOR CUFF REPAIR Left 10/08/2017   Procedure: ROTATOR CUFF REPAIR SHOULDER OPEN Extensive Glenoid Humeral debridement;  Surgeon: Signa Kell, MD;  Location: [facility];  Service: Orthopedics;  Laterality: Left;  . TUBAL LIGATION  1977    Social History  Substance Use Topics  . Smoking status: Current Every Day Smoker    Packs/day: 1.00    Years: 45.00    Types: Cigarettes  . Smokeless tobacco: Never Used  . Alcohol use 0.0 oz/week     Family History      Family History  Problem Relation Age of Onset  . Breast cancer Neg Hx   No bleeding disorders, clotting disorders, or autoimmune diseases, or porphyria       Allergies  Allergen Reactions  . Codeine Nausea Only  . Adhesive [Tape] Itching and Rash  . Penicillins Rash    Has patient had a PCN reaction causing immediate rash, facial/tongue/throat swelling, SOB or lightheadedness with hypotension: no Has patient had a PCN reaction causing severe rash involving mucus membranes or skin necrosis: no Has patient had a PCN reaction that required hospitalization {no Has patient had a PCN reaction occurring within the last 10 years: no If all of the above answers are "NO", then may proceed with Cephalosporin use.     REVIEW OF SYSTEMS(Negative unless checked)  Constitutional: [] Weight loss[] Fever[] Chills Cardiac:[] Chest pain[] Chest pressure[] Palpitations [] Shortness of breath when laying flat [] Shortness of breath at rest  [] Shortness of breath with exertion. 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 Genitourinary: [] Chronic kidney disease [] Difficulturination [] Frequenturination [] Burning with urination[] Blood in urine Skin: [] Rashes [] Ulcers [] Wounds Psychological: [x] History of anxiety[] History of major depression    Physical Examination  Vitals:   03/29/18 1046  BP: (!) 156/77  Pulse: 63  Resp: 16  Weight: 139 lb (63 kg)  Height: 5\' 5"  (1.651 m)   Body mass index  is 23.13 kg/m. Gen:  WD/WN, NAD Head: [place]/AT, No temporalis wasting. Ear/Nose/Throat: Hearing grossly intact, nares w/o erythema or drainage, trachea midline Eyes: Conjunctiva clear. Sclera non-icteric Neck: Supple.  No bruit  Pulmonary:  Good air movement, equal and clear to auscultation bilaterally.  Cardiac: RRR, No JVD Vascular:  Vessel Right Left  Radial Palpable Palpable                  Femoral  1+ palpable Palpable  Popliteal  not palpable  1+ palpable  PT  1+ palpable  1+ palpable  DP  1+ palpable Palpable    Musculoskeletal: M/S 5/5 throughout.  No  deformity or atrophy.  Trace lower extremity edema. Neurologic: CN 2-12 intact. Sensation grossly intact in extremities.  Symmetrical.  Speech is fluent. Motor exam as listed above. Psychiatric: Judgment intact, Mood & affect appropriate for pt's clinical situation. Dermatologic: No rashes or ulcers noted.  No cellulitis or open wounds.      CBC Lab Results  Component Value Date   WBC 15.6 (H) 05/16/2016   HGB 14.9 05/16/2016   HCT 44.5 05/16/2016   MCV 93.0 05/16/2016   PLT 186 05/16/2016    BMET    Component Value Date/Time   NA 140 05/16/2016 0359   K 4.3 05/16/2016 0359   CL 106 05/16/2016 0359   CO2 28 05/16/2016 0359   GLUCOSE 141 (H) 05/16/2016 0359   BUN 14 05/16/2016 0359   CREATININE 0.70 05/16/2016 0359   CALCIUM 9.4 05/16/2016 0359   GFRNONAA >60 05/16/2016 0359   GFRAA >60 05/16/2016 0359   CrCl cannot be calculated (Patient's most recent lab result is older than the maximum 21 days allowed.).  COAG Lab Results  Component Value Date   INR 0.95 11/17/2015    Radiology No results found.   Assessment/Plan Tobacco use disorder We again discussed the continued tobacco use would increase her risk of recurrence of her carotid disease and also increased the progression of peripheral arterial disease and her extremities is that his indeed the cause for her problems, which I suspect it is.  She understands this, but finds cessation difficult.  We discussed that tobacco use also impairs the collateral she can build up from her peripheral arterial disease, and increases the progression of the native disease. We spent 3-4 minutes discussing this and the patient understands it is deleterious to her vascular system.  Essential hypertension blood pressure control important in reducing the progression of atherosclerotic disease. On appropriate oral medications.   Hyperlipidemia lipid control important in reducing the progression of atherosclerotic disease.  Continue statin therapy  Carotid stenosis Her duplex shows her right carotid stent to be patent without recurrent stenosis.  Her left carotid stenosis has progressed slightly and is now in the 40 to 59% range although the lower end of that range.  Continue Plavix, and Crestor.  We will still check in 6 months with mild progression on the left.  Atherosclerosis of native arteries of extremity with intermittent claudication (HCC) Moderate right leg claudication.  No limb threatening symptoms.  Reasonably stable from previous visit.  Patient does not desire intervention which is reasonable.  Right ABI is reduced at 0.66 and left ABI is 1.01.  Minimal change from previous studies.  Continue current medical regimen and recheck in 6 months    Festus Barren, MD  04/02/2018 8:44 AM    This note was created with Dragon medical transcription system.  Any errors from dictation are purely unintentional

## 2018-03-29 NOTE — Assessment & Plan Note (Signed)
Her duplex shows her right carotid stent to be patent without recurrent stenosis.  Her left carotid stenosis has progressed slightly and is now in the 40 to 59% range although the lower end of that range.  Continue Plavix, and Crestor.  We will still check in 6 months with mild progression on the left.

## 2018-03-29 NOTE — Assessment & Plan Note (Signed)
Moderate right leg claudication.  No limb threatening symptoms.  Reasonably stable from previous visit.  Patient does not desire intervention which is reasonable.  Right ABI is reduced at 0.66 and left ABI is 1.01.  Minimal change from previous studies.  Continue current medical regimen and recheck in 6 months

## 2018-07-11 ENCOUNTER — Other Ambulatory Visit: Payer: Self-pay | Admitting: Hematology and Oncology

## 2018-07-17 ENCOUNTER — Inpatient Hospital Stay: Payer: Medicare HMO | Attending: Oncology | Admitting: Oncology

## 2018-07-17 ENCOUNTER — Inpatient Hospital Stay: Payer: Medicare HMO

## 2018-07-17 ENCOUNTER — Other Ambulatory Visit: Payer: Self-pay

## 2018-07-17 ENCOUNTER — Encounter: Payer: Self-pay | Admitting: Oncology

## 2018-07-17 VITALS — BP 140/81 | HR 76 | Temp 97.0°F | Resp 18 | Ht 65.5 in | Wt 140.2 lb

## 2018-07-17 DIAGNOSIS — D751 Secondary polycythemia: Secondary | ICD-10-CM

## 2018-07-17 DIAGNOSIS — D509 Iron deficiency anemia, unspecified: Secondary | ICD-10-CM

## 2018-07-17 DIAGNOSIS — Z720 Tobacco use: Secondary | ICD-10-CM | POA: Diagnosis not present

## 2018-07-17 DIAGNOSIS — F17210 Nicotine dependence, cigarettes, uncomplicated: Secondary | ICD-10-CM | POA: Insufficient documentation

## 2018-07-17 LAB — CBC WITH DIFFERENTIAL/PLATELET
ABS IMMATURE GRANULOCYTES: 0.01 10*3/uL (ref 0.00–0.07)
BASOS PCT: 1 %
Basophils Absolute: 0.1 10*3/uL (ref 0.0–0.1)
Eosinophils Absolute: 0.2 10*3/uL (ref 0.0–0.5)
Eosinophils Relative: 4 %
HCT: 48.3 % — ABNORMAL HIGH (ref 36.0–46.0)
Hemoglobin: 16.2 g/dL — ABNORMAL HIGH (ref 12.0–15.0)
Immature Granulocytes: 0 %
Lymphocytes Relative: 28 %
Lymphs Abs: 1.7 10*3/uL (ref 0.7–4.0)
MCH: 31.1 pg (ref 26.0–34.0)
MCHC: 33.5 g/dL (ref 30.0–36.0)
MCV: 92.7 fL (ref 80.0–100.0)
MONO ABS: 0.5 10*3/uL (ref 0.1–1.0)
MONOS PCT: 9 %
Neutro Abs: 3.5 10*3/uL (ref 1.7–7.7)
Neutrophils Relative %: 58 %
PLATELETS: 223 10*3/uL (ref 150–400)
RBC: 5.21 MIL/uL — AB (ref 3.87–5.11)
RDW: 13.5 % (ref 11.5–15.5)
WBC: 6 10*3/uL (ref 4.0–10.5)
nRBC: 0 % (ref 0.0–0.2)

## 2018-07-17 NOTE — Progress Notes (Signed)
Hematology/Oncology Consult note [facility] [phone] [phone]   Patient Care Team: Sherron Monday, MD as PCP - General (Internal Medicine) Sherron Monday, MD as Referring Physician (Internal Medicine) Kieth Brightly, MD (General Surgery)  REFERRING PROVIDER: Sherron Monday, MD  CHIEF COMPLAINTS/REASON FOR VISIT:  Evaluation of polycytosis  HISTORY OF PRESENTING ILLNESS:  Bethany Lozano is a  68 y.o.  female with PMH listed below who was referred to me for evaluation of polycytosis/erythrocytosis Reviewed patient's recent lab work which was obtain by PCP.  CBC 07/10/2018 showed elevated hemoglobin at 16.2,  total white count 4.5 platelet counts 225,000.  Chronic Onset, duration since 2018.  No aggravating or alleviating factors.   Associated signs or symptoms: Denies weight loss, fever, chills, fatigue, night sweats.   Context:  Smoking history: current daily smoker.  History of blood clots: denies Family history of polycythemia: denies.   Per patient, she has had nocturnal oxymetry done which showed decreased oxygen level at night. Has not had sleep study done.    Review of Systems  Constitutional: Negative for appetite change, chills, fatigue and fever.  HENT:   Negative for hearing loss and voice change.   Eyes: Negative for eye problems.  Respiratory: Negative for chest tightness, cough and shortness of breath.   Cardiovascular: Negative for chest pain.  Gastrointestinal: Negative for abdominal distention, abdominal pain and blood in stool.  Endocrine: Negative for hot flashes.  Genitourinary: Negative for difficulty urinating and frequency.   Musculoskeletal: Negative for arthralgias.  Skin: Negative for itching and rash.  Neurological: Negative for extremity weakness.  Hematological: Negative for adenopathy.  Psychiatric/Behavioral: Negative for confusion.    MEDICAL HISTORY:  Past Medical History:   Diagnosis Date  . Anxiety   . Depression   . Heart murmur   . Hemorrhoids   . Hyperlipidemia   . Peripheral vascular disease (HCC)   . Sinusitis   . Tobacco use     SURGICAL HISTORY: Past Surgical History:  Procedure Laterality Date  . ABDOMINAL HYSTERECTOMY  1991  . APPENDECTOMY    . BICEPT TENODESIS Left 10/08/2017   Procedure: BICEPS TENODESIS;  Surgeon: Signa Kell, MD;  Location: [facility] ORS;  Service: Orthopedics;  Laterality: Left;  . CARDIAC CATHETERIZATION N/A 11/08/2015   Procedure: Left Heart Cath and Coronary Angiography;  Surgeon: Laurier Nancy, MD;  Location: [facility];  Service: Cardiovascular;  Laterality: N/A;  . CARDIAC CATHETERIZATION     Mynx placed in right artery after heart catherization  . CAROTID STENT INSERTION Right 05/15/2016  . COLONOSCOPY  2014  . ENDARTERECTOMY Right 11/25/2015   Procedure: ENDARTERECTOMY CAROTID;  Surgeon: Annice Needy, MD;  Location: [facility] ORS;  Service: Vascular;  Laterality: Right;  . FISSURECTOMY  10/22/14  . HEMORRHOIDECTOMY WITH HEMORRHOID BANDING  1991,06/20/14, 10/22/14  . PERIPHERAL VASCULAR CATHETERIZATION Right 05/15/2016   Procedure: Carotid PTA/Stent Intervention;  Surgeon: Annice Needy, MD;  Location: [facility];  Service: Cardiovascular;  Laterality: Right;  . RESECTION DISTAL CLAVICAL  10/08/2017   Procedure: RESECTION DISTAL CLAVICAL;  Surgeon: Signa Kell, MD;  Location: [facility];  Service: Orthopedics;;  . SHOULDER ARTHROSCOPY WITH SUBACROMIAL DECOMPRESSION Left 10/08/2017   Procedure: SHOULDER ARTHROSCOPY WITH SUBACROMIAL DECOMPRESSION;  Surgeon: Signa Kell, MD;  Location: [facility];  Service: Orthopedics;  Laterality: Left;  . SHOULDER OPEN ROTATOR CUFF REPAIR Left 10/08/2017   Procedure: ROTATOR CUFF REPAIR SHOULDER OPEN Extensive Glenoid Humeral debridement;  Surgeon: Allena Katz,  Learta CoddingSunny, MD;  Location: [facility] OR[facility];  Service: Orthopedics;  Laterality: Left;  . TUBAL LIGATION  1977    SOCIAL HISTORY: Social  History   Socioeconomic History  . Marital status: Single    Spouse name: Not on file  . Number of children: Not on file  . Years of education: Not on file  . Highest education level: Not on file  Occupational History  . Occupation: retired  Engineer, productionocial Needs  . Financial resource strain: Not on file  . Food insecurity:    Worry: Not on file    Inability: Not on file  . Transportation needs:    Medical: Not on file    Non-medical: Not on file  Tobacco Use  . Smoking status: Current Every Day Smoker    Packs/day: 1.00    Years: 45.00    Pack years: 45.00    Types: Cigarettes  . Smokeless tobacco: Never Used  Substance and Sexual Activity  . Alcohol use: Not Currently    Alcohol/week: 0.0 standard drinks  . Drug use: No  . Sexual activity: Not on file  Lifestyle  . Physical activity:    Days per week: Not on file    Minutes per session: Not on file  . Stress: Not on file  Relationships  . Social connections:    Talks on phone: Not on file    Gets together: Not on file    Attends religious service: Not on file    Active member of club or organization: Not on file    Attends meetings of clubs or organizations: Not on file    Relationship status: Not on file  . Intimate partner violence:    Fear of current or ex partner: Not on file    Emotionally abused: Not on file    Physically abused: Not on file    Forced sexual activity: Not on file  Other Topics Concern  . Not on file  Social History Narrative  . Not on file    FAMILY HISTORY: Family History  Problem Relation Age of Onset  . Hypertension Mother   . Osteoporosis Mother   . Heart attack Father   . Diabetes Brother   . Breast cancer Neg Hx     ALLERGIES:  is allergic to codeine; adhesive [tape]; and penicillins.  MEDICATIONS:  Current Outpatient Medications  Medication Sig Dispense Refill  . acetaminophen (TYLENOL) 500 MG tablet Take 2 tablets (1,000 mg total) by mouth every 8 (eight) hours. 90 tablet 2    . aspirin (ADULT ASPIRIN EC LOW STRENGTH) 81 MG EC tablet Take 81 mg by mouth daily.    . chlorthalidone (HYGROTON) 25 MG tablet Take 12.5 mg by mouth daily.  0  . Cholecalciferol (VITAMIN D3) 5000 units CAPS Take 1,000 Units by mouth daily.     . clobetasol (TEMOVATE) 0.05 % GEL apply topically as needed for rash    . clonazePAM (KLONOPIN) 0.5 MG tablet Take 0.5 mg by mouth at bedtime. May take an additional 0.25 mg during the day as needed for anxiety  0  . clopidogrel (PLAVIX) 75 MG tablet Take 1 tablet (75 mg total) by mouth daily with breakfast. 30 tablet 3  . DULoxetine (CYMBALTA) 30 MG capsule Take 60 mg by mouth daily.     Marland Kitchen. estradiol (ESTRACE) 0.5 MG tablet Take 0.5 mg by mouth every morning.  0  . fluticasone (FLONASE) 50 MCG/ACT nasal spray Place 1 spray into both nostrils daily as needed for allergies  or rhinitis.   0  . loratadine (CLARITIN) 10 MG tablet Take 10 mg by mouth daily as needed for allergies.    . montelukast (SINGULAIR) 10 MG tablet Take 10 mg by mouth at bedtime.   0  . rOPINIRole (REQUIP) 0.5 MG tablet rOPINIRole HCl 0.5 MG Oral Tablet QTY: 30 tablet Days: 30 Refills: 1  Written: 07/10/18 Patient Instructions: 1 every bedtime after completion of 0.25    . rosuvastatin (CRESTOR) 40 MG tablet Take 40 mg by mouth at bedtime.     . ondansetron (ZOFRAN ODT) 4 MG disintegrating tablet Take 1 tablet (4 mg total) by mouth every 8 (eight) hours as needed for nausea or vomiting. (Patient not taking: Reported on 07/17/2018) 20 tablet 0  . oxyCODONE (ROXICODONE) 5 MG immediate release tablet Take 1-2 tablets (5-10 mg total) by mouth every 4 (four) hours as needed (pain). (Patient not taking: Reported on 03/29/2018) 45 tablet 0  . PREMARIN 0.3 MG tablet Take 0.3 mg by mouth every evening.   3   No current facility-administered medications for this visit.      PHYSICAL EXAMINATION: ECOG PERFORMANCE STATUS: 0 - Asymptomatic Vitals:   07/17/18 1536  BP: 140/81  Pulse: 76  Resp:  18  Temp: (!) 97 F (36.1 C)   Filed Weights   07/17/18 1536  Weight: 140 lb 3.2 oz (63.6 kg)    Physical Exam Constitutional:      General: She is not in acute distress. HENT:     Head: Normocephalic and atraumatic.  Eyes:     General: No scleral icterus.    Pupils: Pupils are equal, round, and reactive to light.  Neck:     Musculoskeletal: Normal range of motion and neck supple.  Cardiovascular:     Rate and Rhythm: Normal rate and regular rhythm.     Heart sounds: Normal heart sounds.  Pulmonary:     Effort: Pulmonary effort is normal. No respiratory distress.     Breath sounds: No wheezing.     Comments: Decreased breath sounds bilaterally.  Abdominal:     General: Bowel sounds are normal. There is no distension.     Palpations: Abdomen is soft. There is no mass.     Tenderness: There is no abdominal tenderness.  Musculoskeletal: Normal range of motion.        General: No deformity.  Skin:    General: Skin is warm and dry.     Findings: No erythema or rash.  Neurological:     Mental Status: She is alert and oriented to person, place, and time.     Cranial Nerves: No cranial nerve deficit.     Coordination: Coordination normal.  Psychiatric:        Behavior: Behavior normal.        Thought Content: Thought content normal.      LABORATORY DATA:  I have reviewed the data as listed Lab Results  Component Value Date   WBC 6.0 07/17/2018   HGB 16.2 (H) 07/17/2018   HCT 48.3 (H) 07/17/2018   MCV 92.7 07/17/2018   PLT 223 07/17/2018   No results for input(s): NA, K, CL, CO2, GLUCOSE, BUN, CREATININE, CALCIUM, GFRNONAA, GFRAA, PROT, ALBUMIN, AST, ALT, ALKPHOS, BILITOT, BILIDIR, IBILI in the last 8760 hours. Iron/TIBC/Ferritin/ %Sat No results found for: IRON, TIBC, FERRITIN, IRONPCTSAT   RADIOGRAPHIC STUDIES: I have personally reviewed the radiological images as listed and agreed with the findings in the report. 04/04/2017 US abdomen Limited RUQ Normal  right  upper quadrant ultrasound.  ASSESSMENT & PLAN:  1. Polycythemia   2. Tobacco abuse    Labs reviewed and discussed with patient.  Most likely secondary to smoking and possible underlying chronic lung disease/sleep apnea. Suggest obtaining Sleep Study.  Will check JAk2 mutation with reflex.  # Smoking cessation was discussed with patient and cessation program information was given to patient.   For now hemoglobin is slightly above 16, will not trigger therapeutic phlebotomy.  Follow up in 3 months with repeat CBC.   Orders Placed This Encounter  Procedures  . CBC with Differential/Platelet    Standing Status:   Future    Number of Occurrences:   1    Standing Expiration Date:   07/17/2019  . JAK2 V617F, w Reflex to CALR/E12/MPL    Standing Status:   Future    Number of Occurrences:   1    Standing Expiration Date:   07/18/2019    We spent sufficient time to discuss many aspect of care, questions were answered to patient's satisfaction. The patient knows to call the clinic with any problems questions or concerns.  Return of visit: 4 months Thank you for this kind referral and the opportunity to participate in the care of this patient. A copy of today's note is routed to referring provider  Total face to face encounter time for this patient visit was 45 min. >50% of the time was  spent in counseling and coordination of care.    Rickard Patience, MD, PhD Hematology Oncology [facility] Pager- [phone] 07/17/2018

## 2018-07-17 NOTE — Progress Notes (Signed)
Patient here for initial visit. °

## 2018-07-25 LAB — CALR + JAK2 E12-15 + MPL (REFLEXED)

## 2018-07-25 LAB — JAK2 V617F, W REFLEX TO CALR/E12/MPL

## 2018-09-03 ENCOUNTER — Other Ambulatory Visit: Payer: Self-pay | Admitting: Internal Medicine

## 2018-09-03 DIAGNOSIS — Z1231 Encounter for screening mammogram for malignant neoplasm of breast: Secondary | ICD-10-CM

## 2018-09-11 ENCOUNTER — Ambulatory Visit
Admission: RE | Admit: 2018-09-11 | Discharge: 2018-09-11 | Disposition: A | Discharge status: Home / Self Care | Payer: Medicare HMO | Source: Ambulatory Visit | Attending: Internal Medicine | Admitting: Internal Medicine

## 2018-09-11 ENCOUNTER — Other Ambulatory Visit: Payer: Self-pay

## 2018-09-11 DIAGNOSIS — Z1231 Encounter for screening mammogram for malignant neoplasm of breast: Secondary | ICD-10-CM | POA: Insufficient documentation

## 2018-09-16 ENCOUNTER — Other Ambulatory Visit (INDEPENDENT_AMBULATORY_CARE_PROVIDER_SITE_OTHER): Payer: Self-pay | Admitting: Vascular Surgery

## 2018-09-27 ENCOUNTER — Encounter (INDEPENDENT_AMBULATORY_CARE_PROVIDER_SITE_OTHER): Payer: Self-pay | Admitting: Vascular Surgery

## 2018-09-27 ENCOUNTER — Other Ambulatory Visit: Payer: Self-pay

## 2018-09-27 ENCOUNTER — Ambulatory Visit (INDEPENDENT_AMBULATORY_CARE_PROVIDER_SITE_OTHER): Payer: Medicare HMO

## 2018-09-27 ENCOUNTER — Ambulatory Visit (INDEPENDENT_AMBULATORY_CARE_PROVIDER_SITE_OTHER): Payer: Medicare HMO | Admitting: Vascular Surgery

## 2018-09-27 VITALS — BP 130/78 | HR 69 | Resp 16 | Ht 65.5 in | Wt 141.2 lb

## 2018-09-27 DIAGNOSIS — I70211 Atherosclerosis of native arteries of extremities with intermittent claudication, right leg: Secondary | ICD-10-CM

## 2018-09-27 DIAGNOSIS — I6523 Occlusion and stenosis of bilateral carotid arteries: Secondary | ICD-10-CM | POA: Diagnosis not present

## 2018-09-27 DIAGNOSIS — I10 Essential (primary) hypertension: Secondary | ICD-10-CM | POA: Diagnosis not present

## 2018-09-27 DIAGNOSIS — E785 Hyperlipidemia, unspecified: Secondary | ICD-10-CM | POA: Diagnosis not present

## 2018-09-27 DIAGNOSIS — F17210 Nicotine dependence, cigarettes, uncomplicated: Secondary | ICD-10-CM

## 2018-09-27 DIAGNOSIS — Z79899 Other long term (current) drug therapy: Secondary | ICD-10-CM

## 2018-09-27 NOTE — Assessment & Plan Note (Signed)
Carotid duplex today show only mildly elevated velocities in the right ICA stent without hemodynamically significant recurrent stenosis.  Left carotid stenosis remains on the lower end of the 40 to 59% range.  No role for intervention.  Recheck in 6 months.

## 2018-09-27 NOTE — Assessment & Plan Note (Signed)
Her ABIs today are stable at 0.68 on the right and slightly decreased at 0.76 on the left although her left leg waveforms remain triphasic and her digital pressure remains pretty good.  Her right leg waveforms are monophasic and unchanged from her previous study. She remains stable in terms of her symptoms.  She does not desire any intervention at this time which seems reasonable.  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  At this point, we will continue to follow her and plan a 71-month follow-up.  No change in her medication regimen.

## 2018-09-27 NOTE — Patient Instructions (Signed)
Peripheral Vascular Disease  Peripheral vascular disease (PVD) is a disease of the blood vessels that are not part of your heart and brain. A simple term for PVD is poor circulation. In most cases, PVD narrows the blood vessels that carry blood from your heart to the rest of your body. This can reduce the supply of blood to your arms, legs, and internal organs, like your stomach or kidneys. However, PVD most often affects a person's lower legs and feet. Without treatment, PVD tends to get worse. PVD can also lead to acute ischemic limb. This is when an arm or leg suddenly cannot get enough blood. This is a medical emergency. Follow these instructions at home: Lifestyle  Do not use any products that contain nicotine or tobacco, such as cigarettes and e-cigarettes. If you need help quitting, ask your doctor.  Lose weight if you are overweight. Or, stay at a healthy weight as told by your doctor.  Eat a diet that is low in fat and cholesterol. If you need help, ask your doctor.  Exercise regularly. Ask your doctor for activities that are right for you. General instructions  Take over-the-counter and prescription medicines only as told by your doctor.  Take good care of your feet: ? Wear comfortable shoes that fit well. ? Check your feet often for any cuts or sores.  Keep all follow-up visits as told by your doctor This is important. Contact a doctor if:  You have cramps in your legs when you walk.  You have leg pain when you are at rest.  You have coldness in a leg or foot.  Your skin changes.  You are unable to get or have an erection (erectile dysfunction).  You have cuts or sores on your feet that do not heal. Get help right away if:  Your arm or leg turns cold, numb, and blue.  Your arms or legs become red, warm, swollen, painful, or numb.  You have chest pain.  You have trouble breathing.  You suddenly have weakness in your face, arm, or leg.  You become very  confused or you cannot speak.  You suddenly have a very bad headache.  You suddenly cannot see. Summary  Peripheral vascular disease (PVD) is a disease of the blood vessels.  A simple term for PVD is poor circulation. Without treatment, PVD tends to get worse.  Treatment may include exercise, low fat and low cholesterol diet, and quitting smoking. This information is not intended to replace advice given to you by your health care provider. Make sure you discuss any questions you have with your health care provider. Document Released: 09/13/2009 Document Revised: 07/27/2016 Document Reviewed: 07/27/2016 Elsevier Interactive Patient Education  2019 [employer]  

## 2018-09-27 NOTE — Progress Notes (Signed)
MRN : [id number]  Bethany Lozano is a 68 y.o. (1951/05/17) female who presents with chief complaint of  Chief Complaint  Patient presents with   Follow-up    ultrasound follow up  .  History of Present Illness: Patient returns in follow-up of multiple vascular issues.  She continues to have right leg claudication symptoms but no rest pain or ulceration.  Her ABIs today are stable at 0.68 on the right and slightly decreased at 0.76 on the left although her left leg waveforms remain triphasic and her digital pressure remains pretty good.  Her right leg waveforms are monophasic and unchanged from her previous study. The patient is also studied with carotid duplex for her known carotid disease.  She is status post right carotid endarterectomy and a subsequent right carotid artery stent placement for recurrent stenosis.  No recent focal neurologic symptoms. Specifically, the patient denies amaurosis fugax, speech or swallowing difficulties, or arm or leg weakness or numbness. Carotid duplex today show only mildly elevated velocities in the right ICA stent without hemodynamically significant recurrent stenosis.  Left carotid stenosis remains on the lower end of the 40 to 59% range.   Current Outpatient Medications  Medication Sig Dispense Refill   acetaminophen (TYLENOL) 500 MG tablet Take 2 tablets (1,000 mg total) by mouth every 8 (eight) hours. 90 tablet 2   aspirin (ADULT ASPIRIN EC LOW STRENGTH) 81 MG EC tablet Take 81 mg by mouth daily.     chlorthalidone (HYGROTON) 25 MG tablet Take 12.5 mg by mouth daily.  0   Cholecalciferol (VITAMIN D3) 5000 units CAPS Take 1,000 Units by mouth daily.      clonazePAM (KLONOPIN) 0.5 MG tablet Take 0.5 mg by mouth at bedtime. May take an additional 0.25 mg during the day as needed for anxiety  0   clopidogrel (PLAVIX) 75 MG tablet TAKE 1 TABLET BY MOUTH EVERY DAY 90 tablet 3   DULoxetine (CYMBALTA) 30 MG capsule Take 60 mg by mouth daily.       estradiol (ESTRACE) 0.5 MG tablet Take 0.5 mg by mouth every morning.  0   fluticasone (FLONASE) 50 MCG/ACT nasal spray Place 1 spray into both nostrils daily as needed for allergies or rhinitis.   0   loratadine (CLARITIN) 10 MG tablet Take 10 mg by mouth daily as needed for allergies.     montelukast (SINGULAIR) 10 MG tablet Take 10 mg by mouth at bedtime.   0   rosuvastatin (CRESTOR) 40 MG tablet Take 40 mg by mouth at bedtime.      clobetasol (TEMOVATE) 0.05 % GEL apply topically as needed for rash     ondansetron (ZOFRAN ODT) 4 MG disintegrating tablet Take 1 tablet (4 mg total) by mouth every 8 (eight) hours as needed for nausea or vomiting. (Patient not taking: Reported on 07/17/2018) 20 tablet 0   oxyCODONE (ROXICODONE) 5 MG immediate release tablet Take 1-2 tablets (5-10 mg total) by mouth every 4 (four) hours as needed (pain). (Patient not taking: Reported on 03/29/2018) 45 tablet 0   PREMARIN 0.3 MG tablet Take 0.3 mg by mouth every evening.   3   rOPINIRole (REQUIP) 0.5 MG tablet rOPINIRole HCl 0.5 MG Oral Tablet QTY: 30 tablet Days: 30 Refills: 1  Written: 07/10/18 Patient Instructions: 1 every bedtime after completion of 0.25     No current facility-administered medications for this visit.     Past Medical History:  Diagnosis Date   Anxiety  Depression    Heart murmur    Hemorrhoids    Hyperlipidemia    Peripheral vascular disease (HCC)    Sinusitis    Tobacco use     Past Surgical History:  Procedure Laterality Date   ABDOMINAL HYSTERECTOMY  1991   APPENDECTOMY     BICEPT TENODESIS Left 10/08/2017   Procedure: BICEPS TENODESIS;  Surgeon: Signa Kell, MD;  Location: [facility];  Service: Orthopedics;  Laterality: Left;   CARDIAC CATHETERIZATION N/A 11/08/2015   Procedure: Left Heart Cath and Coronary Angiography;  Surgeon: Laurier Nancy, MD;  Location: [facility];  Service: Cardiovascular;  Laterality: N/A;   CARDIAC CATHETERIZATION      Mynx placed in right artery after heart catherization   CAROTID STENT INSERTION Right 05/15/2016   COLONOSCOPY  2014   ENDARTERECTOMY Right 11/25/2015   Procedure: ENDARTERECTOMY CAROTID;  Surgeon: Annice Needy, MD;  Location: [facility];  Service: Vascular;  Laterality: Right;   FISSURECTOMY  10/22/14   HEMORRHOIDECTOMY WITH HEMORRHOID BANDING  1991,06/20/14, 10/22/14   PERIPHERAL VASCULAR CATHETERIZATION Right 05/15/2016   Procedure: Carotid PTA/Stent Intervention;  Surgeon: Annice Needy, MD;  Location: [facility];  Service: Cardiovascular;  Laterality: Right;   RESECTION DISTAL CLAVICAL  10/08/2017   Procedure: RESECTION DISTAL CLAVICAL;  Surgeon: Signa Kell, MD;  Location: [facility];  Service: Orthopedics;;   SHOULDER ARTHROSCOPY WITH SUBACROMIAL DECOMPRESSION Left 10/08/2017   Procedure: SHOULDER ARTHROSCOPY WITH SUBACROMIAL DECOMPRESSION;  Surgeon: Signa Kell, MD;  Location: [facility];  Service: Orthopedics;  Laterality: Left;   SHOULDER OPEN ROTATOR CUFF REPAIR Left 10/08/2017   Procedure: ROTATOR CUFF REPAIR SHOULDER OPEN Extensive Glenoid Humeral debridement;  Surgeon: Signa Kell, MD;  Location: [facility];  Service: Orthopedics;  Laterality: Left;   TUBAL LIGATION  1977   Social History  Substance Use Topics   Smoking status: Current Every Day Smoker    Packs/day: 1.00    Years: 45.00    Types: Cigarettes   Smokeless tobacco: Never Used   Alcohol use 0.0 oz/week     Family History      Family History  Problem Relation Age of Onset   Breast cancer Neg Hx   No bleeding disorders, clotting disorders, or autoimmune diseases, or porphyria       Allergies  Allergen Reactions   Codeine Nausea Only   Adhesive [Tape] Itching and Rash   Penicillins Rash    Has patient had a PCN reaction causing immediate rash, facial/tongue/throat swelling, SOB or lightheadedness with hypotension: no Has patient had a PCN reaction causing severe rash  involving mucus membranes or skin necrosis: no Has patient had a PCN reaction that required hospitalization {no Has patient had a PCN reaction occurring within the last 10 years: no If all of the above answers are "NO", then may proceed with Cephalosporin use.     REVIEW OF SYSTEMS(Negative unless checked)  Constitutional: ?Weight loss[] ?Fever[] ?Chills Cardiac:[] ?Chest pain[] ?Chest pressure[] ?Palpitations ?Shortness of breath when laying flat ?Shortness of breath at rest ?Shortness of breath with exertion. Vascular: ?Pain in legs with walking[] ?Pain in legsat rest[] ?Pain in legs when laying flat ?Claudication ?Pain in feet when walking ?Pain in feet at rest ?Pain in feet when laying flat ?History of DVT ?Phlebitis ?Swelling in legs ?Varicose veins ?Non-healing ulcers Pulmonary: ?Uses home oxygen ?Productive cough[] ?Hemoptysis ?Wheeze ?COPD ?Asthma Neurologic: ?Dizziness ?Blackouts ?Seizures ?History of stroke ?History of TIA[] ?Aphasia ?Temporary blindness[] ?Dysphagia ?Weaknessor numbness in arms ?Weakness or numbnessin legs Musculoskeletal: ?Arthritis ?Joint swelling ?Joint pain ?Low  back pain Hematologic:[] ?Easy bruising[] ?Easy bleeding ?Hypercoagulable state ?Anemic ?Hepatitis Gastrointestinal:[] ?Blood in stool[] ?Vomiting blood[] ?Gastroesophageal reflux/heartburn[] ?Difficulty swallowing. Genitourinary: ?Chronic kidney disease ?Difficulturination ?Frequenturination ?Burning with urination[] ?Blood in urine Skin: ?Rashes ?Ulcers ?Wounds Psychological: ?History of anxiety[] ?History of major depression     Physical Examination  Vitals:   09/27/18 1100  BP: 130/78  Pulse: 69  Resp: 16  Weight: 141 lb 3.2 oz (64 kg)  Height: 5' 5.5" (1.664 m)   Body mass index is 23.14  kg/m. Gen:  WD/WN, NAD Head: [place]/AT, No temporalis wasting. Ear/Nose/Throat: Hearing grossly intact, nares w/o erythema or drainage, trachea midline Eyes: Conjunctiva clear. Sclera non-icteric Neck: Supple. Trachea Midline Pulmonary:  Good air movement, respirations not labored, no use of accessory muscles Cardiac: RRR, No JVD Vascular:  Vessel Right Left  Radial Palpable Palpable                          PT 1+ Palpable 2+ Palpable  DP Trace Palpable 1+ Palpable    Musculoskeletal: M/S 5/5 throughout.  No deformity or atrophy. No edema. Neurologic: CN 2-12 intact. Sensation grossly intact in extremities.  Symmetrical.  Speech is fluent. Motor exam as listed above. Psychiatric: Judgment intact, Mood & affect appropriate for pt's clinical situation. Dermatologic: No rashes or ulcers noted.  No cellulitis or open wounds.      CBC Lab Results  Component Value Date   WBC 6.0 07/17/2018   HGB 16.2 (H) 07/17/2018   HCT 48.3 (H) 07/17/2018   MCV 92.7 07/17/2018   PLT 223 07/17/2018    BMET    Component Value Date/Time   NA 140 05/16/2016 0359   K 4.3 05/16/2016 0359   CL 106 05/16/2016 0359   CO2 28 05/16/2016 0359   GLUCOSE 141 (H) 05/16/2016 0359   BUN 14 05/16/2016 0359   CREATININE 0.70 05/16/2016 0359   CALCIUM 9.4 05/16/2016 0359   GFRNONAA >60 05/16/2016 0359   GFRAA >60 05/16/2016 0359   CrCl cannot be calculated (Patient's most recent lab result is older than the maximum 21 days allowed.).  COAG Lab Results  Component Value Date   INR 0.95 11/17/2015    Radiology Mm 3d Screen Breast Bilateral  Result Date: 09/11/2018 CLINICAL DATA:  Screening. EXAM: DIGITAL SCREENING BILATERAL MAMMOGRAM WITH TOMO AND CAD COMPARISON:  Previous exam(s). ACR Breast Density Category c: The breast tissue is heterogeneously dense, which may obscure small masses. FINDINGS: There are no findings suspicious for malignancy. Images were processed with CAD. IMPRESSION: No  mammographic evidence of malignancy. A result letter of this screening mammogram will be mailed directly to the patient. RECOMMENDATION: Screening mammogram in one year. (Code:SM-B-01Y) BI-RADS CATEGORY  1: Negative. Electronically Signed   By: Harmon Pier M.D.   On: 09/11/2018 14:41     Assessment/Plan  Essential hypertension blood pressure control important in reducing the progression of atherosclerotic disease. On appropriate oral medications.   Hyperlipidemia lipid control important in reducing the progression of atherosclerotic disease. Continue statin therapy  Atherosclerosis of native arteries of extremity with intermittent claudication (HCC) Her ABIs today are stable at 0.68 on the right and slightly decreased at 0.76 on the left although her left leg waveforms remain triphasic and her digital pressure remains pretty good.  Her right leg waveforms are monophasic and unchanged from her previous study. She remains stable in terms of her symptoms.  She does not desire any intervention at this time which seems reasonable.  We know that a significant portion  of her disease appears to be common femoral disease which would likely need surgical therapy although advances have been made in endovascular techniques in this location.  At this point, we will continue to follow her and plan a 38-month follow-up.  No change in her medication regimen.  Carotid stenosis Carotid duplex today show only mildly elevated velocities in the right ICA stent without hemodynamically significant recurrent stenosis.  Left carotid stenosis remains on the lower end of the 40 to 59% range.  No role for intervention.  Recheck in 6 months.    Festus Barren, MD  09/27/2018 11:33 AM    This note was created with Dragon medical transcription system.  Any errors from dictation are purely unintentional

## 2018-11-12 ENCOUNTER — Other Ambulatory Visit: Payer: Self-pay

## 2018-11-12 DIAGNOSIS — D509 Iron deficiency anemia, unspecified: Secondary | ICD-10-CM

## 2018-11-14 ENCOUNTER — Other Ambulatory Visit: Payer: Self-pay

## 2018-11-14 ENCOUNTER — Inpatient Hospital Stay: Payer: Medicare HMO | Attending: Oncology

## 2018-11-14 DIAGNOSIS — D751 Secondary polycythemia: Secondary | ICD-10-CM | POA: Insufficient documentation

## 2018-11-14 DIAGNOSIS — F17210 Nicotine dependence, cigarettes, uncomplicated: Secondary | ICD-10-CM | POA: Diagnosis not present

## 2018-11-14 DIAGNOSIS — D509 Iron deficiency anemia, unspecified: Secondary | ICD-10-CM

## 2018-11-14 LAB — CBC WITH DIFFERENTIAL/PLATELET
Abs Immature Granulocytes: 0.03 10*3/uL (ref 0.00–0.07)
Basophils Absolute: 0.1 10*3/uL (ref 0.0–0.1)
Basophils Relative: 1 %
Eosinophils Absolute: 0.2 10*3/uL (ref 0.0–0.5)
Eosinophils Relative: 3 %
HCT: 46.8 % — ABNORMAL HIGH (ref 36.0–46.0)
Hemoglobin: 15.8 g/dL — ABNORMAL HIGH (ref 12.0–15.0)
Immature Granulocytes: 0 %
Lymphocytes Relative: 26 %
Lymphs Abs: 1.8 10*3/uL (ref 0.7–4.0)
MCH: 31.5 pg (ref 26.0–34.0)
MCHC: 33.8 g/dL (ref 30.0–36.0)
MCV: 93.2 fL (ref 80.0–100.0)
Monocytes Absolute: 0.7 10*3/uL (ref 0.1–1.0)
Monocytes Relative: 10 %
Neutro Abs: 4.3 10*3/uL (ref 1.7–7.7)
Neutrophils Relative %: 60 %
Platelets: 203 10*3/uL (ref 150–400)
RBC: 5.02 MIL/uL (ref 3.87–5.11)
RDW: 13.8 % (ref 11.5–15.5)
WBC: 7.2 10*3/uL (ref 4.0–10.5)
nRBC: 0 % (ref 0.0–0.2)

## 2018-11-15 ENCOUNTER — Encounter: Payer: Self-pay | Admitting: Oncology

## 2018-11-15 ENCOUNTER — Inpatient Hospital Stay (HOSPITAL_BASED_OUTPATIENT_CLINIC_OR_DEPARTMENT_OTHER): Payer: Medicare HMO | Admitting: Oncology

## 2018-11-15 ENCOUNTER — Other Ambulatory Visit: Payer: Medicare HMO

## 2018-11-15 DIAGNOSIS — D751 Secondary polycythemia: Secondary | ICD-10-CM | POA: Diagnosis not present

## 2018-11-15 DIAGNOSIS — Z720 Tobacco use: Secondary | ICD-10-CM

## 2018-11-15 DIAGNOSIS — Z129 Encounter for screening for malignant neoplasm, site unspecified: Secondary | ICD-10-CM | POA: Diagnosis not present

## 2018-11-15 NOTE — Progress Notes (Signed)
HEMATOLOGY-ONCOLOGY TeleHEALTH VISIT PROGRESS NOTE  I connected with Bethany Lozano on 11/15/18 at  1:30 PM EDT by video enabled telemedicine visit and verified that I am speaking with the correct person using two identifiers. I discussed the limitations, risks, security and privacy concerns of performing an evaluation and management service by telemedicine and the availability of in-person appointments. I also discussed with the patient that there may be a patient responsible charge related to this service. The patient expressed understanding and agreed to proceed.   Other persons participating in the visit and their role in the encounter:  Diamantina MonksJulie Eastwood, CMA, check in patient   Merleen NicelyElizabeth Santos, RN, check in patient.   Patient's location: Home  Provider's location: Home office Chief Complaint: follow up for management of erythrocytosis   INTERVAL HISTORY Bethany Lozano is a 68 y.o. female who has above history reviewed by me today presents for follow up visit for management of erythrocytosis.  Problems and complaints are listed below:  Patient was last seen by me on 07/17/2018 She had work up done for erythrocytosis. JAK2 mutation with reflex to MPL and CARL, exon 12-15 were all negative.  Erythrocytosis was considered to be secondary to smoking or underlying lung disease.  She reports doing well today.  She has decreased to using half a pack of cigarette recently however, due to COVID 19 she feels stressed and went back to a pack a day.  Denies any SOB, chest pain, abdominal pain. Appetite is fair. No weight loss.   Review of Systems  Constitutional: Negative for appetite change, chills, fatigue and fever.  HENT:   Negative for hearing loss and voice change.   Eyes: Negative for eye problems.  Respiratory: Positive for cough. Negative for chest tightness.   Cardiovascular: Negative for chest pain.  Gastrointestinal: Negative for abdominal distention, abdominal pain and blood in stool.   Endocrine: Negative for hot flashes.  Genitourinary: Negative for difficulty urinating and frequency.   Musculoskeletal: Negative for arthralgias.  Skin: Negative for itching and rash.  Neurological: Negative for extremity weakness.  Hematological: Negative for adenopathy.  Psychiatric/Behavioral: Negative for confusion.    Past Medical History:  Diagnosis Date  . Anxiety   . Depression   . Heart murmur   . Hemorrhoids   . Hyperlipidemia   . Peripheral vascular disease (HCC)   . Sinusitis   . Tobacco use    Past Surgical History:  Procedure Laterality Date  . ABDOMINAL HYSTERECTOMY  1991  . APPENDECTOMY    . BICEPT TENODESIS Left 10/08/2017   Procedure: BICEPS TENODESIS;  Surgeon: Signa KellPatel, Sunny, MD;  Location: [facility];  Service: Orthopedics;  Laterality: Left;  . CARDIAC CATHETERIZATION N/A 11/08/2015   Procedure: Left Heart Cath and Coronary Angiography;  Surgeon: Laurier NancyShaukat A Khan, MD;  Location: [facility];  Service: Cardiovascular;  Laterality: N/A;  . CARDIAC CATHETERIZATION     Mynx placed in right artery after heart catherization  . CAROTID STENT INSERTION Right 05/15/2016  . COLONOSCOPY  2014  . ENDARTERECTOMY Right 11/25/2015   Procedure: ENDARTERECTOMY CAROTID;  Surgeon: Annice NeedyJason S Dew, MD;  Location: [facility];  Service: Vascular;  Laterality: Right;  . FISSURECTOMY  10/22/14  . HEMORRHOIDECTOMY WITH HEMORRHOID BANDING  1991,06/20/14, 10/22/14  . PERIPHERAL VASCULAR CATHETERIZATION Right 05/15/2016   Procedure: Carotid PTA/Stent Intervention;  Surgeon: Annice NeedyJason S Dew, MD;  Location: [facility];  Service: Cardiovascular;  Laterality: Right;  . RESECTION DISTAL CLAVICAL  10/08/2017   Procedure: RESECTION  DISTAL CLAVICAL;  Surgeon: Signa Kell, MD;  Location: [facility];  Service: Orthopedics;;  . SHOULDER ARTHROSCOPY WITH SUBACROMIAL DECOMPRESSION Left 10/08/2017   Procedure: SHOULDER ARTHROSCOPY WITH SUBACROMIAL DECOMPRESSION;  Surgeon: Signa Kell, MD;  Location:  [facility];  Service: Orthopedics;  Laterality: Left;  . SHOULDER OPEN ROTATOR CUFF REPAIR Left 10/08/2017   Procedure: ROTATOR CUFF REPAIR SHOULDER OPEN Extensive Glenoid Humeral debridement;  Surgeon: Signa Kell, MD;  Location: [facility];  Service: Orthopedics;  Laterality: Left;  . TUBAL LIGATION  1977    Family History  Problem Relation Age of Onset  . Hypertension Mother   . Osteoporosis Mother   . Heart attack Father   . Diabetes Brother   . Breast cancer Neg Hx     Social History   Socioeconomic History  . Marital status: Single    Spouse name: Not on file  . Number of children: Not on file  . Years of education: Not on file  . Highest education level: Not on file  Occupational History  . Occupation: retired  Engineer, production  . Financial resource strain: Not on file  . Food insecurity:    Worry: Not on file    Inability: Not on file  . Transportation needs:    Medical: Not on file    Non-medical: Not on file  Tobacco Use  . Smoking status: Current Every Day Smoker    Packs/day: 1.00    Years: 45.00    Pack years: 45.00    Types: Cigarettes  . Smokeless tobacco: Never Used  Substance and Sexual Activity  . Alcohol use: Not Currently    Alcohol/week: 0.0 standard drinks  . Drug use: No  . Sexual activity: Not on file  Lifestyle  . Physical activity:    Days per week: Not on file    Minutes per session: Not on file  . Stress: Not on file  Relationships  . Social connections:    Talks on phone: Not on file    Gets together: Not on file    Attends religious service: Not on file    Active member of club or organization: Not on file    Attends meetings of clubs or organizations: Not on file    Relationship status: Not on file  . Intimate partner violence:    Fear of current or ex partner: Not on file    Emotionally abused: Not on file    Physically abused: Not on file    Forced sexual activity: Not on file  Other Topics Concern  . Not on file  Social History  Narrative  . Not on file    Current Outpatient Medications on File Prior to Visit  Medication Sig Dispense Refill  . aspirin (ADULT ASPIRIN EC LOW STRENGTH) 81 MG EC tablet Take 81 mg by mouth daily.    . chlorthalidone (HYGROTON) 25 MG tablet Take 12.5 mg by mouth daily.  0  . Cholecalciferol (VITAMIN D3) 5000 units CAPS Take 1,000 Units by mouth daily.     . clobetasol (TEMOVATE) 0.05 % GEL apply topically as needed for rash    . clopidogrel (PLAVIX) 75 MG tablet TAKE 1 TABLET BY MOUTH EVERY DAY 90 tablet 3  . DULoxetine (CYMBALTA) 30 MG capsule Take 60 mg by mouth daily.     Marland Kitchen estradiol (ESTRACE) 0.5 MG tablet Take 0.5 mg by mouth every morning.  0  . fexofenadine-pseudoephedrine (ALLEGRA-D 24) 180-240 MG 24 hr tablet Take 1 tablet by mouth daily as  needed.    . fluticasone (FLONASE) 50 MCG/ACT nasal spray Place 1 spray into both nostrils daily as needed for allergies or rhinitis.   0  . loratadine (CLARITIN) 10 MG tablet Take 10 mg by mouth daily as needed for allergies.    . rosuvastatin (CRESTOR) 40 MG tablet Take 40 mg by mouth at bedtime.      No current facility-administered medications on file prior to visit.     Allergies  Allergen Reactions  . Codeine Nausea Only  . Adhesive [Tape] Itching and Rash  . Penicillins Rash    Tolerates ampicillin  Has patient had a PCN reaction causing immediate rash, facial/tongue/throat swelling, SOB or lightheadedness with hypotension: no Has patient had a PCN reaction causing severe rash involving mucus membranes or skin necrosis: no Has patient had a PCN reaction that required hospitalization {no Has patient had a PCN reaction occurring within the last 10 years: no If all of the above answers are "NO", then may proceed with Cephalosporin use.       Observations/Objective: Today's Vitals   11/15/18 1132  PainSc: 0-No pain   There is no height or weight on file to calculate BMI.  Physical Exam  Constitutional: She is oriented to  person, place, and time and well-developed, well-nourished, and in no distress. No distress.  HENT:  Head: Normocephalic.  Pulmonary/Chest: Effort normal.  Neurological: She is alert and oriented to person, place, and time.  Psychiatric: Affect normal.    CBC    Component Value Date/Time   WBC 7.2 11/14/2018 1318   RBC 5.02 11/14/2018 1318   HGB 15.8 (H) 11/14/2018 1318   HCT 46.8 (H) 11/14/2018 1318   PLT 203 11/14/2018 1318   MCV 93.2 11/14/2018 1318   MCH 31.5 11/14/2018 1318   MCHC 33.8 11/14/2018 1318   RDW 13.8 11/14/2018 1318   LYMPHSABS 1.8 11/14/2018 1318   MONOABS 0.7 11/14/2018 1318   EOSABS 0.2 11/14/2018 1318   BASOSABS 0.1 11/14/2018 1318    CMP     Component Value Date/Time   NA 140 05/16/2016 0359   K 4.3 05/16/2016 0359   CL 106 05/16/2016 0359   CO2 28 05/16/2016 0359   GLUCOSE 141 (H) 05/16/2016 0359   BUN 14 05/16/2016 0359   CREATININE 0.70 05/16/2016 0359   CALCIUM 9.4 05/16/2016 0359   GFRNONAA >60 05/16/2016 0359   GFRAA >60 05/16/2016 0359     Assessment and Plan: 1. Secondary erythrocytosis   2. Tobacco abuse   3. Cancer screening     # labs are reviewed and discussed with patient.  Secondary erythrocytosis likely due to smoking or underlying lung disease.  # Smoking cessation was discussed with patient again and she is motivated.  # Lung cancer screening discussed with patient.  She is interested. Will refer to lung cancer screening program.   Follow Up Instructions: Follow up in 4 months.    I discussed the assessment and treatment plan with the patient. The patient was provided an opportunity to ask questions and all were answered. The patient agreed with the plan and demonstrated an understanding of the instructions.  The patient was advised to call back or seek an in-person evaluation if the symptoms worsen or if the condition fails to improve as anticipated.   I provided 15 minutes of face-to-face video visit time during this  encounter, and > 50% was spent counseling as documented under my assessment & plan.  Rickard Patience, MD 11/15/2018 8:31 PM

## 2018-11-15 NOTE — Progress Notes (Signed)
Patient contacted for telehealth visit. No concerns voiced.  

## 2018-11-19 ENCOUNTER — Telehealth: Payer: Self-pay | Admitting: *Deleted

## 2018-11-19 NOTE — Telephone Encounter (Signed)
Received referral for lung screening scan. Patient contacted and reviewed information and plan for scheduling as soon as possible given delay from covid 19 restrictions.

## 2018-12-02 ENCOUNTER — Telehealth: Payer: Self-pay | Admitting: *Deleted

## 2018-12-02 DIAGNOSIS — Z122 Encounter for screening for malignant neoplasm of respiratory organs: Secondary | ICD-10-CM

## 2018-12-02 DIAGNOSIS — Z87891 Personal history of nicotine dependence: Secondary | ICD-10-CM

## 2018-12-02 NOTE — Telephone Encounter (Signed)
Received referral for initial lung cancer screening scan. Contacted patient and obtained smoking history,(current, 45 pack year) as well as answering questions related to screening process. Patient denies signs of lung cancer such as weight loss or hemoptysis. Patient denies comorbidity that would prevent curative treatment if lung cancer were found. Patient is scheduled for shared decision making visit and CT scan on 12/11/18 at 1230pm.

## 2018-12-11 ENCOUNTER — Inpatient Hospital Stay: Payer: Medicare HMO | Attending: Oncology | Admitting: Oncology

## 2018-12-11 ENCOUNTER — Ambulatory Visit
Admission: RE | Admit: 2018-12-11 | Discharge: 2018-12-11 | Disposition: A | Discharge status: Home / Self Care | Payer: Medicare HMO | Source: Ambulatory Visit | Attending: Oncology | Admitting: Oncology

## 2018-12-11 ENCOUNTER — Other Ambulatory Visit: Payer: Self-pay

## 2018-12-11 DIAGNOSIS — Z122 Encounter for screening for malignant neoplasm of respiratory organs: Secondary | ICD-10-CM | POA: Diagnosis not present

## 2018-12-11 DIAGNOSIS — Z87891 Personal history of nicotine dependence: Secondary | ICD-10-CM

## 2018-12-11 NOTE — Progress Notes (Signed)
Virtual Visit via Video Note  I connected with Bethany Lozano on 12/11/18 at 12:30 PM EDT by a video enabled telemedicine application and verified that I am speaking with the correct person using two identifiers.  Location: Patient: Home Provider: Home   I discussed the limitations of evaluation and management by telemedicine and the availability of in person appointments. The patient expressed understanding and agreed to proceed.  I discussed the assessment and treatment plan with the patient. The patient was provided an opportunity to ask questions and all were answered. The patient agreed with the plan and demonstrated an understanding of the instructions.   The patient was advised to call back or seek an in-person evaluation if the symptoms worsen or if the condition fails to improve as anticipated.   In accordance with CMS guidelines, patient has met eligibility criteria including age, absence of signs or symptoms of lung cancer.  Social History   Tobacco Use  . Smoking status: Current Every Day Smoker    Packs/day: 1.00    Years: 45.00    Pack years: 45.00    Types: Cigarettes  . Smokeless tobacco: Never Used  Substance Use Topics  . Alcohol use: Not Currently    Alcohol/week: 0.0 standard drinks  . Drug use: No      A shared decision-making session was conducted prior to the performance of CT scan. This includes one or more decision aids, includes benefits and harms of screening, follow-up diagnostic testing, over-diagnosis, false positive rate, and total radiation exposure.   Counseling on the importance of adherence to annual lung cancer LDCT screening, impact of co-morbidities, and ability or willingness to undergo diagnosis and treatment is imperative for compliance of the program.   Counseling on the importance of continued smoking cessation for former smokers; the importance of smoking cessation for current smokers, and information about tobacco cessation interventions  have been given to patient including [place] and [facility] quit [place] programs.   Written order for lung cancer screening with LDCT has been given to the patient and any and all questions have been answered to the best of my abilities.    Yearly follow up will be coordinated by Burgess Estelle, Thoracic Navigator.  I provided 10 minutes of non face-to-face telephone visit time during this encounter, and > 50% was spent counseling as documented under my assessment & plan.   Jacquelin Hawking, NP

## 2018-12-13 ENCOUNTER — Encounter: Payer: Self-pay | Admitting: *Deleted

## 2019-03-18 ENCOUNTER — Encounter: Payer: Self-pay | Admitting: Oncology

## 2019-03-18 ENCOUNTER — Other Ambulatory Visit: Payer: Self-pay

## 2019-03-18 NOTE — Progress Notes (Signed)
Patient pre screened for office appointment, no questions or concerns today. 

## 2019-03-19 ENCOUNTER — Other Ambulatory Visit: Payer: Self-pay

## 2019-03-19 ENCOUNTER — Inpatient Hospital Stay (HOSPITAL_BASED_OUTPATIENT_CLINIC_OR_DEPARTMENT_OTHER): Payer: Medicare HMO | Admitting: Oncology

## 2019-03-19 ENCOUNTER — Encounter: Payer: Self-pay | Admitting: Oncology

## 2019-03-19 ENCOUNTER — Inpatient Hospital Stay: Payer: Medicare HMO | Attending: Oncology

## 2019-03-19 VITALS — BP 159/78 | HR 70 | Temp 97.0°F | Resp 16 | Wt 144.0 lb

## 2019-03-19 DIAGNOSIS — D751 Secondary polycythemia: Secondary | ICD-10-CM

## 2019-03-19 DIAGNOSIS — F17210 Nicotine dependence, cigarettes, uncomplicated: Secondary | ICD-10-CM | POA: Diagnosis not present

## 2019-03-19 DIAGNOSIS — Z720 Tobacco use: Secondary | ICD-10-CM

## 2019-03-19 DIAGNOSIS — Z79899 Other long term (current) drug therapy: Secondary | ICD-10-CM | POA: Diagnosis not present

## 2019-03-19 LAB — COMPREHENSIVE METABOLIC PANEL
ALT: 29 U/L (ref 0–44)
AST: 33 U/L (ref 15–41)
Albumin: 4.5 g/dL (ref 3.5–5.0)
Alkaline Phosphatase: 73 U/L (ref 38–126)
Anion gap: 8 (ref 5–15)
BUN: 11 mg/dL (ref 8–23)
CO2: 28 mmol/L (ref 22–32)
Calcium: 9.4 mg/dL (ref 8.9–10.3)
Chloride: 102 mmol/L (ref 98–111)
Creatinine, Ser: 0.61 mg/dL (ref 0.44–1.00)
GFR calc Af Amer: >60 mL/min (ref >60)
GFR calc non Af Amer: >60 mL/min (ref >60)
Glucose, Bld: 96 mg/dL (ref 70–99)
Potassium: 3.8 mmol/L (ref 3.5–5.1)
Sodium: 138 mmol/L (ref 135–145)
Total Bilirubin: 0.6 mg/dL (ref 0.3–1.2)
Total Protein: 7.6 g/dL (ref 6.5–8.1)

## 2019-03-19 LAB — CBC WITH DIFFERENTIAL/PLATELET
Abs Immature Granulocytes: 0.01 10*3/uL (ref 0.00–0.07)
Basophils Absolute: 0.1 10*3/uL (ref 0.0–0.1)
Basophils Relative: 1 %
Eosinophils Absolute: 0.2 10*3/uL (ref 0.0–0.5)
Eosinophils Relative: 5 %
HCT: 46.7 % — ABNORMAL HIGH (ref 36.0–46.0)
Hemoglobin: 15.6 g/dL — ABNORMAL HIGH (ref 12.0–15.0)
Immature Granulocytes: 0 %
Lymphocytes Relative: 30 %
Lymphs Abs: 1.5 10*3/uL (ref 0.7–4.0)
MCH: 31.5 pg (ref 26.0–34.0)
MCHC: 33.4 g/dL (ref 30.0–36.0)
MCV: 94.2 fL (ref 80.0–100.0)
Monocytes Absolute: 0.5 10*3/uL (ref 0.1–1.0)
Monocytes Relative: 10 %
Neutro Abs: 2.8 10*3/uL (ref 1.7–7.7)
Neutrophils Relative %: 54 %
Platelets: 205 10*3/uL (ref 150–400)
RBC: 4.96 MIL/uL (ref 3.87–5.11)
RDW: 13.5 % (ref 11.5–15.5)
WBC: 5.1 10*3/uL (ref 4.0–10.5)
nRBC: 0 % (ref 0.0–0.2)

## 2019-03-20 NOTE — Progress Notes (Signed)
Hematology/Oncology progress note [facility] [phone] [phone]   Patient Care Team: Sherron Monday, MD as PCP - General (Internal Medicine) Sherron Monday, MD as Referring Physician (Internal Medicine) Kieth Brightly, MD (General Surgery)  REFERRING PROVIDER: Sherron Monday, MD  CHIEF COMPLAINTS/REASON FOR VISIT:  Follow up for erythrocytosis  HISTORY OF PRESENTING ILLNESS:  Bethany Lozano is a  68 y.o.  female with PMH listed below who was referred to me for evaluation of polycytosis/erythrocytosis Reviewed patient's recent lab work which was obtain by PCP.  CBC 07/10/2018 showed elevated hemoglobin at 16.2,  total white count 4.5 platelet counts 225,000.  Chronic Onset, duration since 2018.  No aggravating or alleviating factors.   Associated signs or symptoms: Denies weight loss, fever, chills, fatigue, night sweats.   Context:  Smoking history: current daily smoker.  History of blood clots: denies Family history of polycythemia: denies.   Per patient, she has had nocturnal oxymetry done which showed decreased oxygen level at night. Has not had sleep study done.    INTERVAL HISTORY Bethany Lozano is a 68 y.o. female who has above history reviewed by me today presents for follow up visit for management of erythrocytosis Problems and complaints are listed below: Patient has no new complaints today.  Continues to smoke daily.  She Has had a CT chest lung cancer screening done on 12/11/2018.  Lung RADS 2 benign appearance.  Review of Systems  Constitutional: Negative for appetite change, chills, fatigue and fever.  HENT:   Negative for hearing loss and voice change.   Eyes: Negative for eye problems.  Respiratory: Negative for chest tightness, cough and shortness of breath.   Cardiovascular: Negative for chest pain.  Gastrointestinal: Negative for abdominal distention, abdominal pain and blood in stool.   Endocrine: Negative for hot flashes.  Genitourinary: Negative for difficulty urinating and frequency.   Musculoskeletal: Negative for arthralgias.  Skin: Negative for itching and rash.  Neurological: Negative for extremity weakness.  Hematological: Negative for adenopathy.  Psychiatric/Behavioral: Negative for confusion.    MEDICAL HISTORY:  Past Medical History:  Diagnosis Date  . Anxiety   . Depression   . Heart murmur   . Hemorrhoids   . Hyperlipidemia   . Peripheral vascular disease (HCC)   . Sinusitis   . Tobacco use     SURGICAL HISTORY: Past Surgical History:  Procedure Laterality Date  . ABDOMINAL HYSTERECTOMY  1991  . APPENDECTOMY    . BICEPT TENODESIS Left 10/08/2017   Procedure: BICEPS TENODESIS;  Surgeon: Signa Kell, MD;  Location: [facility] ORS;  Service: Orthopedics;  Laterality: Left;  . CARDIAC CATHETERIZATION N/A 11/08/2015   Procedure: Left Heart Cath and Coronary Angiography;  Surgeon: Laurier Nancy, MD;  Location: [facility] INVASIVE CV LAB;  Service: Cardiovascular;  Laterality: N/A;  . CARDIAC CATHETERIZATION     Mynx placed in right artery after heart catherization  . CAROTID STENT INSERTION Right 05/15/2016  . COLONOSCOPY  2014  . ENDARTERECTOMY Right 11/25/2015   Procedure: ENDARTERECTOMY CAROTID;  Surgeon: Annice Needy, MD;  Location: [facility] ORS;  Service: Vascular;  Laterality: Right;  . FISSURECTOMY  10/22/14  . HEMORRHOIDECTOMY WITH HEMORRHOID BANDING  1991,06/20/14, 10/22/14  . PERIPHERAL VASCULAR CATHETERIZATION Right 05/15/2016   Procedure: Carotid PTA/Stent Intervention;  Surgeon: Annice Needy, MD;  Location: [facility] INVASIVE CV LAB;  Service: Cardiovascular;  Laterality: Right;  . RESECTION DISTAL CLAVICAL  10/08/2017   Procedure: RESECTION DISTAL CLAVICAL;  Surgeon: Allena Katz,  Learta CoddingSunny, MD;  Location: [facility] ORS;  Service: Orthopedics;;  . SHOULDER ARTHROSCOPY WITH SUBACROMIAL DECOMPRESSION Left 10/08/2017   Procedure: SHOULDER ARTHROSCOPY WITH SUBACROMIAL DECOMPRESSION;   Surgeon: Signa KellPatel, Sunny, MD;  Location: [facility] ORS;  Service: Orthopedics;  Laterality: Left;  . SHOULDER OPEN ROTATOR CUFF REPAIR Left 10/08/2017   Procedure: ROTATOR CUFF REPAIR SHOULDER OPEN Extensive Glenoid Humeral debridement;  Surgeon: Signa KellPatel, Sunny, MD;  Location: [facility] ORS;  Service: Orthopedics;  Laterality: Left;  . TUBAL LIGATION  1977    SOCIAL HISTORY: Social History   Socioeconomic History  . Marital status: Single    Spouse name: Not on file  . Number of children: Not on file  . Years of education: Not on file  . Highest education level: Not on file  Occupational History  . Occupation: retired  Engineer, productionocial Needs  . Financial resource strain: Not on file  . Food insecurity    Worry: Not on file    Inability: Not on file  . Transportation needs    Medical: Not on file    Non-medical: Not on file  Tobacco Use  . Smoking status: Current Every Day Smoker    Packs/day: 1.00    Years: 45.00    Pack years: 45.00    Types: Cigarettes  . Smokeless tobacco: Never Used  Substance and Sexual Activity  . Alcohol use: Not Currently    Alcohol/week: 0.0 standard drinks  . Drug use: No  . Sexual activity: Not on file  Lifestyle  . Physical activity    Days per week: Not on file    Minutes per session: Not on file  . Stress: Not on file  Relationships  . Social Musicianconnections    Talks on phone: Not on file    Gets together: Not on file    Attends religious service: Not on file    Active member of club or organization: Not on file    Attends meetings of clubs or organizations: Not on file    Relationship status: Not on file  . Intimate partner violence    Fear of current or ex partner: Not on file    Emotionally abused: Not on file    Physically abused: Not on file    Forced sexual activity: Not on file  Other Topics Concern  . Not on file  Social History Narrative  . Not on file    FAMILY HISTORY: Family History  Problem Relation Age of Onset  . Hypertension Mother   .  Osteoporosis Mother   . Heart attack Father   . Diabetes Brother   . Breast cancer Neg Hx     ALLERGIES:  is allergic to codeine; adhesive [tape]; and penicillins.  MEDICATIONS:  Current Outpatient Medications  Medication Sig Dispense Refill  . aspirin (ADULT ASPIRIN EC LOW STRENGTH) 81 MG EC tablet Take 81 mg by mouth daily.    . chlorthalidone (HYGROTON) 25 MG tablet Take 12.5 mg by mouth daily.  0  . Cholecalciferol (VITAMIN D3) 5000 units CAPS Take 1,000 Units by mouth daily.     . clobetasol (TEMOVATE) 0.05 % GEL apply topically as needed for rash    . clopidogrel (PLAVIX) 75 MG tablet TAKE 1 TABLET BY MOUTH EVERY DAY 90 tablet 3  . DULoxetine (CYMBALTA) 30 MG capsule Take 60 mg by mouth daily.     Marland Kitchen. estradiol (ESTRACE) 0.5 MG tablet Take 0.5 mg by mouth every morning.  0  . fluticasone (FLONASE) 50 MCG/ACT nasal spray Place 1  spray into both nostrils daily as needed for allergies or rhinitis.   0  . loratadine (CLARITIN) 10 MG tablet Take 10 mg by mouth daily as needed for allergies.    . rosuvastatin (CRESTOR) 40 MG tablet Take 40 mg by mouth at bedtime.     . fexofenadine-pseudoephedrine (ALLEGRA-D 24) 180-240 MG 24 hr tablet Take 1 tablet by mouth daily as needed.     No current facility-administered medications for this visit.      PHYSICAL EXAMINATION: ECOG PERFORMANCE STATUS: 0 - Asymptomatic Vitals:   03/19/19 1341  BP: (!) 159/78  Pulse: 70  Resp: 16  Temp: (!) 97 F (36.1 C)   Filed Weights   03/19/19 1341  Weight: 144 lb (65.3 kg)    Physical Exam Constitutional:      General: She is not in acute distress. HENT:     Head: Normocephalic and atraumatic.  Eyes:     General: No scleral icterus.    Pupils: Pupils are equal, round, and reactive to light.  Neck:     Musculoskeletal: Normal range of motion and neck supple.  Cardiovascular:     Rate and Rhythm: Normal rate and regular rhythm.     Heart sounds: Normal heart sounds.  Pulmonary:     Effort:  Pulmonary effort is normal. No respiratory distress.     Breath sounds: No wheezing.     Comments: Decreased breath sounds bilaterally.  Abdominal:     General: Bowel sounds are normal. There is no distension.     Palpations: Abdomen is soft. There is no mass.     Tenderness: There is no abdominal tenderness.  Musculoskeletal: Normal range of motion.        General: No deformity.  Skin:    General: Skin is warm and dry.     Findings: No erythema or rash.  Neurological:     Mental Status: She is alert and oriented to person, place, and time.     Cranial Nerves: No cranial nerve deficit.     Coordination: Coordination normal.  Psychiatric:        Behavior: Behavior normal.        Thought Content: Thought content normal.      LABORATORY DATA:  I have reviewed the data as listed Lab Results  Component Value Date   WBC 5.1 03/19/2019   HGB 15.6 (H) 03/19/2019   HCT 46.7 (H) 03/19/2019   MCV 94.2 03/19/2019   PLT 205 03/19/2019   Recent Labs    03/19/19 1327  NA 138  K 3.8  CL 102  CO2 28  GLUCOSE 96  BUN 11  CREATININE 0.61  CALCIUM 9.4  GFRNONAA >60  GFRAA >60  PROT 7.6  ALBUMIN 4.5  AST 33  ALT 29  ALKPHOS 73  BILITOT 0.6   Iron/TIBC/Ferritin/ %Sat No results found for: IRON, TIBC, FERRITIN, IRONPCTSAT   RADIOGRAPHIC STUDIES: I have personally reviewed the radiological images as listed and agreed with the findings in the report. 04/04/2017 US abdomen Limited RUQ Normal right upper quadrant ultrasound.  No results found.   ASSESSMENT & PLAN:  1. Tobacco abuse   2. Secondary erythrocytosis    #Labs reviewed and discussed with patient. 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 Hemoglobin is chronically elevated and stable. Discussed with patient that she does not need phlebotomy at this point. Smoke cessation discussed with patient.   Follow up in 6 months with repeat CBC.   Orders Placed This Encounter  Procedures  .  CBC with  Differential/Platelet    Standing Status:   Future    Standing Expiration Date:   03/18/2020    We spent sufficient time to discuss many aspect of care, questions were answered to patient's satisfaction. The patient knows to call the clinic with any problems questions or concerns.  Return of visit: 6 months  Rickard PatienceZhou Laila Myhre, MD, PhD Hematology Oncology [facility] Pager- [phone] 03/20/2019

## 2019-04-01 ENCOUNTER — Ambulatory Visit (INDEPENDENT_AMBULATORY_CARE_PROVIDER_SITE_OTHER): Payer: Medicare HMO

## 2019-04-01 ENCOUNTER — Encounter (INDEPENDENT_AMBULATORY_CARE_PROVIDER_SITE_OTHER): Payer: Self-pay | Admitting: Vascular Surgery

## 2019-04-01 ENCOUNTER — Ambulatory Visit (INDEPENDENT_AMBULATORY_CARE_PROVIDER_SITE_OTHER): Payer: Medicare HMO | Admitting: Vascular Surgery

## 2019-04-01 ENCOUNTER — Other Ambulatory Visit: Payer: Self-pay

## 2019-04-01 VITALS — BP 145/80 | HR 77 | Resp 10 | Ht 65.0 in | Wt 145.0 lb

## 2019-04-01 DIAGNOSIS — I10 Essential (primary) hypertension: Secondary | ICD-10-CM | POA: Diagnosis not present

## 2019-04-01 DIAGNOSIS — I70211 Atherosclerosis of native arteries of extremities with intermittent claudication, right leg: Secondary | ICD-10-CM | POA: Diagnosis not present

## 2019-04-01 DIAGNOSIS — I6523 Occlusion and stenosis of bilateral carotid arteries: Secondary | ICD-10-CM

## 2019-04-01 DIAGNOSIS — E785 Hyperlipidemia, unspecified: Secondary | ICD-10-CM

## 2019-04-01 NOTE — Assessment & Plan Note (Signed)
Her ABIs today have dropped some on the right down to 0.50 and are a little higher on the left at 0.84.  She has a strong monophasic to biphasic waveform on the right and a triphasic waveform on the left. At current, she would like to continue conservative therapy and not perform intervention which is very reasonable as she does not have any limb threatening symptoms.  We will continue relatively close follow-up and see her back in 6 months or sooner if problems develop in the interim.

## 2019-04-01 NOTE — Patient Instructions (Signed)
Peripheral Vascular Disease  Peripheral vascular disease (PVD) is a disease of the blood vessels that are not part of your heart and brain. A simple term for PVD is poor circulation. In most cases, PVD narrows the blood vessels that carry blood from your heart to the rest of your body. This can reduce the supply of blood to your arms, legs, and internal organs, like your stomach or kidneys. However, PVD most often affects a person's lower legs and feet. Without treatment, PVD tends to get worse. PVD can also lead to acute ischemic limb. This is when an arm or leg suddenly cannot get enough blood. This is a medical emergency. Follow these instructions at home: Lifestyle  Do not use any products that contain nicotine or tobacco, such as cigarettes and e-cigarettes. If you need help quitting, ask your doctor.  Lose weight if you are overweight. Or, stay at a healthy weight as told by your doctor.  Eat a diet that is low in fat and cholesterol. If you need help, ask your doctor.  Exercise regularly. Ask your doctor for activities that are right for you. General instructions  Take over-the-counter and prescription medicines only as told by your doctor.  Take good care of your feet: ? Wear comfortable shoes that fit well. ? Check your feet often for any cuts or sores.  Keep all follow-up visits as told by your doctor This is important. Contact a doctor if:  You have cramps in your legs when you walk.  You have leg pain when you are at rest.  You have coldness in a leg or foot.  Your skin changes.  You are unable to get or have an erection (erectile dysfunction).  You have cuts or sores on your feet that do not heal. Get help right away if:  Your arm or leg turns cold, numb, and blue.  Your arms or legs become red, warm, swollen, painful, or numb.  You have chest pain.  You have trouble breathing.  You suddenly have weakness in your face, arm, or leg.  You become very  confused or you cannot speak.  You suddenly have a very bad headache.  You suddenly cannot see. Summary  Peripheral vascular disease (PVD) is a disease of the blood vessels.  A simple term for PVD is poor circulation. Without treatment, PVD tends to get worse.  Treatment may include exercise, low fat and low cholesterol diet, and quitting smoking. This information is not intended to replace advice given to you by your health care provider. Make sure you discuss any questions you have with your health care provider. Document Released: 09/13/2009 Document Revised: 06/01/2017 Document Reviewed: 07/27/2016 Elsevier Patient Education  2020 [employer]  

## 2019-04-01 NOTE — Assessment & Plan Note (Signed)
Carotid duplex today reveals the right carotid stent to be widely patent without recurrent stenosis.  Her left carotid stenosis is in the 1 to 39% range and is stable.  No role for intervention at this level.  We will continue to follow her relatively closely due to her early recurrence after surgery and the fact that we are already following her for peripheral artery disease on 28-month intervals.  Repeat carotid duplex in 6 months.

## 2019-04-01 NOTE — Progress Notes (Signed)
MRN : [id number]  Bethany MorelLaura E Lozano is a 68 y.o. (June 16, 1951) female who presents with chief complaint of  Chief Complaint  Patient presents with   Follow-up  .  History of Present Illness: Patient returns in follow-up of multiple vascular issues.  She is doing well today.  She continues to have claudication symptoms mostly in the right leg but these are reasonably stable without dramatic change from her last visit.  Her ABIs today have dropped some on the right down to 0.50 and are a little higher on the left at 0.84.  She has a strong monophasic to biphasic waveform on the right and a triphasic waveform on the left.  Current Outpatient Medications  Medication Sig Dispense Refill   aspirin (ADULT ASPIRIN EC LOW STRENGTH) 81 MG EC tablet Take 81 mg by mouth daily.     chlorthalidone (HYGROTON) 25 MG tablet Take 12.5 mg by mouth daily.  0   Cholecalciferol (VITAMIN D3) 5000 units CAPS Take 1,000 Units by mouth daily.      clobetasol (TEMOVATE) 0.05 % GEL apply topically as needed for rash     clonazePAM (KLONOPIN) 0.5 MG tablet Take 0.5 mg by mouth daily.     clopidogrel (PLAVIX) 75 MG tablet TAKE 1 TABLET BY MOUTH EVERY DAY 90 tablet 3   DULoxetine (CYMBALTA) 30 MG capsule Take 60 mg by mouth daily.      estradiol (ESTRACE) 0.5 MG tablet Take 0.5 mg by mouth every morning.  0   fexofenadine-pseudoephedrine (ALLEGRA-D 24) 180-240 MG 24 hr tablet Take 1 tablet by mouth daily as needed.     fluticasone (FLONASE) 50 MCG/ACT nasal spray Place 1 spray into both nostrils daily as needed for allergies or rhinitis.   0   loratadine (CLARITIN) 10 MG tablet Take 10 mg by mouth daily as needed for allergies.     montelukast (SINGULAIR) 10 MG tablet Take 10 mg by mouth daily.     rosuvastatin (CRESTOR) 40 MG tablet Take 40 mg by mouth at bedtime.      No current facility-administered medications for this visit.     Past Medical History:  Diagnosis Date   Anxiety    Depression     Heart murmur    Hemorrhoids    Hyperlipidemia    Peripheral vascular disease (HCC)    Sinusitis    Tobacco use     Past Surgical History:  Procedure Laterality Date   ABDOMINAL HYSTERECTOMY  1991   APPENDECTOMY     BICEPT TENODESIS Left 10/08/2017   Procedure: BICEPS TENODESIS;  Surgeon: Signa KellPatel, Sunny, MD;  Location: [facility];  Service: Orthopedics;  Laterality: Left;   CARDIAC CATHETERIZATION N/A 11/08/2015   Procedure: Left Heart Cath and Coronary Angiography;  Surgeon: Laurier NancyShaukat A Khan, MD;  Location: [facility];  Service: Cardiovascular;  Laterality: N/A;   CARDIAC CATHETERIZATION     Mynx placed in right artery after heart catherization   CAROTID STENT INSERTION Right 05/15/2016   COLONOSCOPY  2014   ENDARTERECTOMY Right 11/25/2015   Procedure: ENDARTERECTOMY CAROTID;  Surgeon: Annice NeedyJason S Creighton Longley, MD;  Location: [facility];  Service: Vascular;  Laterality: Right;   FISSURECTOMY  10/22/14   HEMORRHOIDECTOMY WITH HEMORRHOID BANDING  1991,06/20/14, 10/22/14   PERIPHERAL VASCULAR CATHETERIZATION Right 05/15/2016   Procedure: Carotid PTA/Stent Intervention;  Surgeon: Annice NeedyJason S Everley Evora, MD;  Location: [facility];  Service: Cardiovascular;  Laterality: Right;   RESECTION DISTAL CLAVICAL  10/08/2017   Procedure: RESECTION  DISTAL CLAVICAL;  Surgeon: Leim Fabry, MD;  Location: [facility];  Service: Orthopedics;;   SHOULDER ARTHROSCOPY WITH SUBACROMIAL DECOMPRESSION Left 10/08/2017   Procedure: SHOULDER ARTHROSCOPY WITH SUBACROMIAL DECOMPRESSION;  Surgeon: Leim Fabry, MD;  Location: [facility];  Service: Orthopedics;  Laterality: Left;   SHOULDER OPEN ROTATOR CUFF REPAIR Left 10/08/2017   Procedure: ROTATOR CUFF REPAIR SHOULDER OPEN Extensive Glenoid Humeral debridement;  Surgeon: Leim Fabry, MD;  Location: [facility];  Service: Orthopedics;  Laterality: Left;   TUBAL LIGATION  1977    Social History Social History   Tobacco Use   Smoking status: Current Every Day Smoker     Packs/day: 1.00    Years: 45.00    Pack years: 45.00    Types: Cigarettes   Smokeless tobacco: Never Used  Substance Use Topics   Alcohol use: Not Currently    Alcohol/week: 0.0 standard drinks   Drug use: No    Family History Family History  Problem Relation Age of Onset   Hypertension Mother    Osteoporosis Mother    Heart attack Father    Diabetes Brother    Breast cancer Neg Hx     Allergies  Allergen Reactions   Codeine Nausea Only   Latex     Other reaction(s): Other (See Comments)   Adhesive [Tape] Itching and Rash   Penicillins Rash    Tolerates ampicillin  Has patient had a PCN reaction causing immediate rash, facial/tongue/throat swelling, SOB or lightheadedness with hypotension: no Has patient had a PCN reaction causing severe rash involving mucus membranes or skin necrosis: no Has patient had a PCN reaction that required hospitalization {no Has patient had a PCN reaction occurring within the last 10 years: no If all of the above answers are "NO", then may proceed with Cephalosporin use.    REVIEW OF SYSTEMS(Negative unless checked)  Constitutional: [] ??Weight loss[] ??Fever[] ??Chills Cardiac:[] ??Chest pain[] ??Chest pressure[] ??Palpitations [] ??Shortness of breath when laying flat [] ??Shortness of breath at rest [] ??Shortness of breath with exertion. Vascular: [x] ??Pain in legs with walking[] ??Pain in legsat rest[] ??Pain in legs when laying flat [x] ??Claudication [] ??Pain in feet when walking [] ??Pain in feet at rest [] ??Pain in feet when laying flat [] ??History of DVT [] ??Phlebitis [] ??Swelling in legs [] ??Varicose veins [] ??Non-healing ulcers Pulmonary: [] ??Uses home oxygen [] ??Productive cough[] ??Hemoptysis [] ??Wheeze [] ??COPD [] ??Asthma Neurologic: [x] ??Dizziness [] ??Blackouts [] ??Seizures [] ??History of stroke [] ??History of TIA[] ??Aphasia [x] ??Temporary blindness[] ??Dysphagia  [] ??Weaknessor numbness in arms [x] ??Weakness or numbnessin legs Musculoskeletal: [] ??Arthritis [] ??Joint swelling [] ??Joint pain [] ??Low back pain Hematologic:[] ??Easy bruising[] ??Easy bleeding [] ??Hypercoagulable state [] ??Anemic [] ??Hepatitis Gastrointestinal:[] ??Blood in stool[] ??Vomiting blood[] ??Gastroesophageal reflux/heartburn[] ??Difficulty swallowing. Genitourinary: [] ??Chronic kidney disease [] ??Difficulturination [] ??Frequenturination [] ??Burning with urination[] ??Blood in urine Skin: [] ??Rashes [] ??Ulcers [] ??Wounds Psychological: [x] ??History of anxiety[] ??History of major depression   Physical Examination  Vitals:   04/01/19 1351  BP: (!) 145/80  Pulse: 77  Resp: 10  Weight: 145 lb (65.8 kg)  Height: 5\' 5"  (1.651 m)   Body mass index is 24.13 kg/m. Gen:  WD/WN, NAD Head: [place]/AT, No temporalis wasting. Ear/Nose/Throat: Hearing grossly intact, nares w/o erythema or drainage, trachea midline Eyes: Conjunctiva clear. Sclera non-icteric Neck: Supple.  Trachea midline Pulmonary:  Good air movement, equal and clear to auscultation bilaterally.  Cardiac: RRR, No JVD Vascular:  Vessel Right Left  Radial Palpable Palpable                          PT  1+ palpable  1+ palpable  DP  1+ palpable Palpable    Musculoskeletal: M/S 5/5 throughout.  No deformity or  atrophy.  No edema. Neurologic: CN 2-12 intact. Sensation grossly intact in extremities.  Symmetrical.  Speech is fluent. Motor exam as listed above. Psychiatric: Judgment intact, Mood & affect appropriate for pt's clinical situation. Dermatologic: No rashes or ulcers noted.  No cellulitis or open wounds.      CBC Lab Results  Component Value Date   WBC 5.1 03/19/2019   HGB 15.6 (H) 03/19/2019   HCT 46.7 (H) 03/19/2019   MCV 94.2 03/19/2019   PLT 205 03/19/2019    BMET    Component Value Date/Time   NA 138 03/19/2019 1327   K 3.8 03/19/2019 1327     CL 102 03/19/2019 1327   CO2 28 03/19/2019 1327   GLUCOSE 96 03/19/2019 1327   BUN 11 03/19/2019 1327   CREATININE 0.61 03/19/2019 1327   CALCIUM 9.4 03/19/2019 1327   GFRNONAA >60 03/19/2019 1327   GFRAA >60 03/19/2019 1327   Estimated Creatinine Clearance: 62.2 mL/min (by C-G formula based on SCr of 0.61 mg/dL).  COAG Lab Results  Component Value Date   INR 0.95 11/17/2015    Radiology No results found.   Assessment/Plan Essential hypertension blood pressure control important in reducing the progression of atherosclerotic disease. On appropriate oral medications.   Hyperlipidemia lipid control important in reducing the progression of atherosclerotic disease. Continue statin therapy  Atherosclerosis of native arteries of extremity with intermittent claudication (HCC) Her ABIs today have dropped some on the right down to 0.50 and are a little higher on the left at 0.84.  She has a strong monophasic to biphasic waveform on the right and a triphasic waveform on the left. At current, she would like to continue conservative therapy and not perform intervention which is very reasonable as she does not have any limb threatening symptoms.  We will continue relatively close follow-up and see her back in 6 months or sooner if problems develop in the interim.  Carotid stenosis Carotid duplex today reveals the right carotid stent to be widely patent without recurrent stenosis.  Her left carotid stenosis is in the 1 to 39% range and is stable.  No role for intervention at this level.  We will continue to follow her relatively closely due to her early recurrence after surgery and the fact that we are already following her for peripheral artery disease on 95-month intervals.  Repeat carotid duplex in 6 months.    Festus Barren, MD  04/01/2019 2:36 PM    This note was created with Dragon medical transcription system.  Any errors from dictation are purely unintentional

## 2019-09-09 ENCOUNTER — Other Ambulatory Visit (INDEPENDENT_AMBULATORY_CARE_PROVIDER_SITE_OTHER): Payer: Self-pay | Admitting: Vascular Surgery

## 2019-09-12 ENCOUNTER — Other Ambulatory Visit: Payer: Self-pay

## 2019-09-12 ENCOUNTER — Inpatient Hospital Stay: Payer: Medicare HMO | Attending: Oncology

## 2019-09-12 DIAGNOSIS — F329 Major depressive disorder, single episode, unspecified: Secondary | ICD-10-CM | POA: Insufficient documentation

## 2019-09-12 DIAGNOSIS — Z79899 Other long term (current) drug therapy: Secondary | ICD-10-CM | POA: Insufficient documentation

## 2019-09-12 DIAGNOSIS — F17210 Nicotine dependence, cigarettes, uncomplicated: Secondary | ICD-10-CM | POA: Diagnosis not present

## 2019-09-12 DIAGNOSIS — D751 Secondary polycythemia: Secondary | ICD-10-CM | POA: Insufficient documentation

## 2019-09-12 DIAGNOSIS — Z720 Tobacco use: Secondary | ICD-10-CM

## 2019-09-12 DIAGNOSIS — F419 Anxiety disorder, unspecified: Secondary | ICD-10-CM | POA: Diagnosis not present

## 2019-09-12 LAB — CBC WITH DIFFERENTIAL/PLATELET
Abs Immature Granulocytes: 0.01 10*3/uL (ref 0.00–0.07)
Basophils Absolute: 0.1 10*3/uL (ref 0.0–0.1)
Basophils Relative: 1 %
Eosinophils Absolute: 0.2 10*3/uL (ref 0.0–0.5)
Eosinophils Relative: 4 %
HCT: 43.8 % (ref 36.0–46.0)
Hemoglobin: 14.7 g/dL (ref 12.0–15.0)
Immature Granulocytes: 0 %
Lymphocytes Relative: 23 %
Lymphs Abs: 1.2 10*3/uL (ref 0.7–4.0)
MCH: 31.4 pg (ref 26.0–34.0)
MCHC: 33.6 g/dL (ref 30.0–36.0)
MCV: 93.6 fL (ref 80.0–100.0)
Monocytes Absolute: 0.5 10*3/uL (ref 0.1–1.0)
Monocytes Relative: 11 %
Neutro Abs: 3.1 10*3/uL (ref 1.7–7.7)
Neutrophils Relative %: 61 %
Platelets: 212 10*3/uL (ref 150–400)
RBC: 4.68 MIL/uL (ref 3.87–5.11)
RDW: 13.8 % (ref 11.5–15.5)
WBC: 5 10*3/uL (ref 4.0–10.5)
nRBC: 0 % (ref 0.0–0.2)

## 2019-09-15 ENCOUNTER — Inpatient Hospital Stay (HOSPITAL_BASED_OUTPATIENT_CLINIC_OR_DEPARTMENT_OTHER): Payer: Medicare HMO | Admitting: Oncology

## 2019-09-15 ENCOUNTER — Inpatient Hospital Stay: Payer: Medicare HMO

## 2019-09-15 ENCOUNTER — Encounter: Payer: Self-pay | Admitting: Oncology

## 2019-09-15 DIAGNOSIS — Z720 Tobacco use: Secondary | ICD-10-CM

## 2019-09-15 DIAGNOSIS — D751 Secondary polycythemia: Secondary | ICD-10-CM | POA: Diagnosis not present

## 2019-09-15 NOTE — Progress Notes (Signed)
Patient is doing video visit, she is doing well no major complaints

## 2019-09-15 NOTE — Progress Notes (Signed)
HEMATOLOGY-ONCOLOGY TeleHEALTH VISIT PROGRESS NOTE  I connected with Bethany Lozano on 09/15/19 at  2:15 PM EDT by video enabled telemedicine visit and verified that I am speaking with the correct person using two identifiers. I discussed the limitations, risks, security and privacy concerns of performing an evaluation and management service by telemedicine and the availability of in-person appointments. I also discussed with the patient that there may be a patient responsible charge related to this service. The patient expressed understanding and agreed to proceed.   Other persons participating in the visit and their role in the encounter:  None  Patient's location: Home  Provider's location: office Chief Complaint: Follow-up for erythrocytosis   INTERVAL HISTORY Bethany Lozano is a 69 y.o. female who has above history reviewed by me today presents for follow up visit for management of erythrocytosis Problems and complaints are listed below:  Patient reports doing well at baseline.  No new complaints.  She continues to smoke half a pack a day. Review of Systems  Constitutional: Negative for appetite change, chills, fatigue and fever.  HENT:   Negative for hearing loss and voice change.   Eyes: Negative for eye problems.  Respiratory: Negative for chest tightness and cough.   Cardiovascular: Negative for chest pain.  Gastrointestinal: Negative for abdominal distention, abdominal pain and blood in stool.  Endocrine: Negative for hot flashes.  Genitourinary: Negative for difficulty urinating and frequency.   Musculoskeletal: Negative for arthralgias.  Skin: Negative for itching and rash.  Neurological: Negative for extremity weakness.  Hematological: Negative for adenopathy.  Psychiatric/Behavioral: Negative for confusion.    Past Medical History:  Diagnosis Date  . Anxiety   . Depression   . Heart murmur   . Hemorrhoids   . Hyperlipidemia   . Peripheral vascular disease ([place])   .  Sinusitis   . Tobacco use    Past Surgical History:  Procedure Laterality Date  . ABDOMINAL HYSTERECTOMY  1991  . APPENDECTOMY    . BICEPT TENODESIS Left 10/08/2017   Procedure: BICEPS TENODESIS;  Surgeon: Leim Fabry, MD;  Location: [facility];  Service: Orthopedics;  Laterality: Left;  . CARDIAC CATHETERIZATION N/A 11/08/2015   Procedure: Left Heart Cath and Coronary Angiography;  Surgeon: Dionisio David, MD;  Location: [place] [facility];  Service: Cardiovascular;  Laterality: N/A;  . CARDIAC CATHETERIZATION     Mynx placed in right artery after heart catherization  . CAROTID STENT INSERTION Right 05/15/2016  . COLONOSCOPY  2014  . ENDARTERECTOMY Right 11/25/2015   Procedure: ENDARTERECTOMY CAROTID;  Surgeon: Algernon Huxley, MD;  Location: [facility];  Service: Vascular;  Laterality: Right;  . FISSURECTOMY  10/22/14  . [place]  1991,06/20/14, 10/22/14  . PERIPHERAL VASCULAR CATHETERIZATION Right 05/15/2016   Procedure: Carotid PTA/Stent Intervention;  Surgeon: Algernon Huxley, MD;  Location: [place] [facility];  Service: Cardiovascular;  Laterality: Right;  . RESECTION DISTAL CLAVICAL  10/08/2017   Procedure: RESECTION DISTAL CLAVICAL;  Surgeon: Leim Fabry, MD;  Location: [facility];  Service: Orthopedics;;  . SHOULDER ARTHROSCOPY WITH SUBACROMIAL DECOMPRESSION Left 10/08/2017   Procedure: SHOULDER ARTHROSCOPY WITH SUBACROMIAL DECOMPRESSION;  Surgeon: Leim Fabry, MD;  Location: [facility];  Service: Orthopedics;  Laterality: Left;  . SHOULDER OPEN ROTATOR CUFF REPAIR Left 10/08/2017   Procedure: ROTATOR CUFF REPAIR SHOULDER OPEN Extensive Glenoid Humeral debridement;  Surgeon: Leim Fabry, MD;  Location: [facility];  Service: Orthopedics;  Laterality: Left;  . [place]  Family History  Problem Relation Age of Onset  . Hypertension Mother   . Osteoporosis Mother   . Heart attack Father   . Diabetes Brother   . Breast cancer Neg Hx     Social History    Socioeconomic History  . Marital status: Single    Spouse name: Not on file  . Number of children: Not on file  . Years of education: Not on file  . Highest education level: Not on file  Occupational History  . Occupation: retired  Tobacco Use  . Smoking status: Current Every Day Smoker    Packs/day: 1.00    Years: 45.00    Pack years: 45.00    Types: Cigarettes  . Smokeless tobacco: Never Used  Substance and Sexual Activity  . Alcohol use: Not Currently    Alcohol/week: 0.0 standard drinks  . Drug use: No  . Sexual activity: Not on file  Other Topics Concern  . Not on file  Social History Narrative  . Not on file   Social Determinants of Health   Financial Resource Strain:   . Difficulty of Paying Living Expenses:   Food Insecurity:   . Worried About Programme researcher, broadcasting/film/video in the Last Year:   . Barista in the Last Year:   Transportation Needs:   . Freight forwarder (Medical):   Marland Kitchen Lack of Transportation (Non-Medical):   Physical Activity:   . Days of Exercise per Week:   . Minutes of Exercise per Session:   Stress:   . Feeling of Stress :   Social Connections:   . Frequency of Communication with Friends and Family:   . Frequency of Social Gatherings with Friends and Family:   . Attends Religious Services:   . Active Member of Clubs or Organizations:   . Attends Banker Meetings:   Marland Kitchen Marital Status:   Intimate Partner Violence:   . Fear of Current or Ex-Partner:   . Emotionally Abused:   Marland Kitchen Physically Abused:   . Sexually Abused:     Current Outpatient Medications on File Prior to Visit  Medication Sig Dispense Refill  . aspirin (ADULT ASPIRIN EC LOW STRENGTH) 81 MG EC tablet Take 81 mg by mouth daily.    . chlorthalidone (HYGROTON) 25 MG tablet Take 12.5 mg by mouth daily.  0  . Cholecalciferol (VITAMIN D3) 5000 units CAPS Take 1,000 Units by mouth daily.     . clonazePAM (KLONOPIN) 0.5 MG tablet Take 0.5 mg by mouth daily.    .  clopidogrel (PLAVIX) 75 MG tablet TAKE 1 TABLET BY MOUTH EVERY DAY 90 tablet 3  . Coenzyme Q10 (CO Q 10) 100 MG CAPS Take 100 mg by mouth daily.    . DULoxetine (CYMBALTA) 30 MG capsule Take 60 mg by mouth daily.     Marland Kitchen estradiol (ESTRACE) 0.5 MG tablet Take 0.5 mg by mouth every morning.  0  . ezetimibe (ZETIA) 10 MG tablet Take 10 mg by mouth daily.    . fluticasone (FLONASE) 50 MCG/ACT nasal spray Place 1 spray into both nostrils daily as needed for allergies or rhinitis.   0  . Melatonin 5 MG CAPS Take 5 mg by mouth as needed.    . rosuvastatin (CRESTOR) 40 MG tablet Take 40 mg by mouth at bedtime.      No current facility-administered medications on file prior to visit.    Allergies  Allergen Reactions  . Codeine Nausea Only  .  Latex     Other reaction(s): Other (See Comments)  . Adhesive [Tape] Itching and Rash  . Penicillins Rash    Tolerates ampicillin  Has patient had a PCN reaction causing immediate rash, facial/tongue/throat swelling, SOB or lightheadedness with hypotension: no Has patient had a PCN reaction causing severe rash involving mucus membranes or skin necrosis: no Has patient had a PCN reaction that required hospitalization {no Has patient had a PCN reaction occurring within the last 10 years: no If all of the above answers are "NO", then may proceed with Cephalosporin use.       Observations/Objective: Today's Vitals   09/15/19 1416  PainSc: 0-No pain   There is no height or weight on file to calculate BMI.  Physical Exam  Constitutional: No distress.  Neurological: She is alert.    CBC    Component Value Date/Time   WBC 5.0 09/12/2019 1056   RBC 4.68 09/12/2019 1056   HGB 14.7 09/12/2019 1056   HCT 43.8 09/12/2019 1056   PLT 212 09/12/2019 1056   MCV 93.6 09/12/2019 1056   MCH 31.4 09/12/2019 1056   MCHC 33.6 09/12/2019 1056   RDW 13.8 09/12/2019 1056   LYMPHSABS 1.2 09/12/2019 1056   MONOABS 0.5 09/12/2019 1056   EOSABS 0.2 09/12/2019 1056    BASOSABS 0.1 09/12/2019 1056    CMP     Component Value Date/Time   NA 138 03/19/2019 1327   K 3.8 03/19/2019 1327   CL 102 03/19/2019 1327   CO2 28 03/19/2019 1327   GLUCOSE 96 03/19/2019 1327   BUN 11 03/19/2019 1327   CREATININE 0.61 03/19/2019 1327   CALCIUM 9.4 03/19/2019 1327   PROT 7.6 03/19/2019 1327   ALBUMIN 4.5 03/19/2019 1327   AST 33 03/19/2019 1327   ALT 29 03/19/2019 1327   ALKPHOS 73 03/19/2019 1327   BILITOT 0.6 03/19/2019 1327   GFRNONAA >60 03/19/2019 1327   GFRAA >60 03/19/2019 1327     Assessment and Plan: 1. Secondary erythrocytosis   2. Tobacco abuse     Labs reviewed and discussed with patient. Hemoglobin has normalized to 14.7, hematocrit 43.8. No need for phlebotomy at this point. Discussed with patient about smoke cessation again.  She is motivated. She has had chest lung cancer screening CT scan in June 2020.  She will need another repeat CT scan in June 2021.  Advised patient to follow-up with lung cancer screening program.   Follow Up Instructions: Follow-up in 6 months.   I discussed the assessment and treatment plan with the patient. The patient was provided an opportunity to ask questions and all were answered. The patient agreed with the plan and demonstrated an understanding of the instructions.  The patient was advised to call back or seek an in-person evaluation if the symptoms worsen or if the condition fails to improve as anticipated.    Rickard Patience, MD 09/15/2019 10:55 PM

## 2019-09-19 ENCOUNTER — Other Ambulatory Visit: Payer: Medicare HMO

## 2019-09-30 ENCOUNTER — Encounter (INDEPENDENT_AMBULATORY_CARE_PROVIDER_SITE_OTHER): Payer: Self-pay | Admitting: Vascular Surgery

## 2019-09-30 ENCOUNTER — Ambulatory Visit (INDEPENDENT_AMBULATORY_CARE_PROVIDER_SITE_OTHER): Payer: Medicare HMO

## 2019-09-30 ENCOUNTER — Other Ambulatory Visit: Payer: Self-pay

## 2019-09-30 ENCOUNTER — Ambulatory Visit (INDEPENDENT_AMBULATORY_CARE_PROVIDER_SITE_OTHER): Payer: Medicare HMO | Admitting: Vascular Surgery

## 2019-09-30 VITALS — BP 144/87 | HR 78 | Resp 16 | Ht 65.0 in | Wt 151.0 lb

## 2019-09-30 DIAGNOSIS — I6523 Occlusion and stenosis of bilateral carotid arteries: Secondary | ICD-10-CM

## 2019-09-30 DIAGNOSIS — I70211 Atherosclerosis of native arteries of extremities with intermittent claudication, right leg: Secondary | ICD-10-CM

## 2019-09-30 DIAGNOSIS — E785 Hyperlipidemia, unspecified: Secondary | ICD-10-CM

## 2019-09-30 DIAGNOSIS — I10 Essential (primary) hypertension: Secondary | ICD-10-CM | POA: Diagnosis not present

## 2019-09-30 NOTE — Assessment & Plan Note (Signed)
Carotid duplex today reveals a widely patent right carotid stent without significant recurrent stenosis.  The left carotid demonstrates 1 to 39% ICA stenosis.  No significant progression from previous study.  She did have a reasonably early recurrence, so would like to continue to follow her on 22-month intervals for now.  She will continue her current medical regimen as described above.

## 2019-09-30 NOTE — Assessment & Plan Note (Signed)
Her ABIs today are a little better on the right at 0.79 although her waveforms remain monophasic.  Her left ABI is 1.22 with triphasic waveforms.  Her symptoms are not disabling and she is not interested in any intervention at this point.  Continue aspirin, Plavix, Crestor, and Zetia.  Recheck in 6 months.

## 2019-09-30 NOTE — Progress Notes (Signed)
MRN : [id number]  Bethany Lozano is a 68 y.o. (21-Apr-1951) female who presents with chief complaint of  Chief Complaint  Patient presents with  . Follow-up    ultrasound follow up   .  History of Present Illness: Patient returns in follow-up of multiple vascular issues.  She is doing reasonably well.  She continues to have some claudication symptoms with leg cramps.  She has started co-Q10 which seems to have helped that.  She continues on aspirin, Plavix, and is now on Zetia and Crestor as well.  She does continue to smoke.  Her ABIs today are a little better on the right at 0.79 although her waveforms remain monophasic.  Her left ABI is 1.22 with triphasic waveforms. She is status post right carotid endarterectomy followed by right carotid stent for relatively early recurrence of her carotid disease.  She is doing well today without any focal neurologic symptoms. Specifically, the patient denies amaurosis fugax, speech or swallowing difficulties, or arm or leg weakness or numbness. Carotid duplex today reveals a widely patent right carotid stent without significant recurrent stenosis.  The left carotid demonstrates 1 to 39% ICA stenosis.  No significant progression from previous study.  Current Outpatient Medications  Medication Sig Dispense Refill  . aspirin (ADULT ASPIRIN EC LOW STRENGTH) 81 MG EC tablet Take 81 mg by mouth daily.    . chlorthalidone (HYGROTON) 25 MG tablet Take 12.5 mg by mouth daily.  0  . Cholecalciferol (VITAMIN D3) 5000 units CAPS Take 1,000 Units by mouth daily.     . clonazePAM (KLONOPIN) 0.5 MG tablet Take 0.5 mg by mouth daily.    . clopidogrel (PLAVIX) 75 MG tablet TAKE 1 TABLET BY MOUTH EVERY DAY 90 tablet 3  . Coenzyme Q10 (CO Q 10) 100 MG CAPS Take 100 mg by mouth daily.    . DULoxetine (CYMBALTA) 30 MG capsule Take 60 mg by mouth daily.     Marland Kitchen estradiol (ESTRACE) 0.5 MG tablet Take 0.5 mg by mouth every morning.  0  . ezetimibe (ZETIA) 10 MG tablet Take 10  mg by mouth daily.    . fluticasone (FLONASE) 50 MCG/ACT nasal spray Place 1 spray into both nostrils daily as needed for allergies or rhinitis.   0  . Melatonin 5 MG CAPS Take 5 mg by mouth as needed.    . rosuvastatin (CRESTOR) 40 MG tablet Take 40 mg by mouth at bedtime.      No current facility-administered medications for this visit.    Past Medical History:  Diagnosis Date  . Anxiety   . Depression   . Heart murmur   . Hemorrhoids   . Hyperlipidemia   . Peripheral vascular disease ([place])   . Sinusitis   . Tobacco use     Past Surgical History:  Procedure Laterality Date  . ABDOMINAL HYSTERECTOMY  1991  . APPENDECTOMY    . BICEPT TENODESIS Left 10/08/2017   Procedure: BICEPS TENODESIS;  Surgeon: Leim Fabry, MD;  Location: [facility];  Service: Orthopedics;  Laterality: Left;  . CARDIAC CATHETERIZATION N/A 11/08/2015   Procedure: Left Heart Cath and Coronary Angiography;  Surgeon: Dionisio David, MD;  Location: [facility];  Service: Cardiovascular;  Laterality: N/A;  . CARDIAC CATHETERIZATION     Mynx placed in right artery after heart catherization  . CAROTID STENT INSERTION Right 05/15/2016  . COLONOSCOPY  2014  . ENDARTERECTOMY Right 11/25/2015   Procedure: ENDARTERECTOMY CAROTID;  Surgeon: Erskine Squibb  Wyn Quaker, MD;  Location: [facility];  Service: Vascular;  Laterality: Right;  . FISSURECTOMY  10/22/14  . HEMORRHOIDECTOMY WITH HEMORRHOID BANDING  1991,06/20/14, 10/22/14  . PERIPHERAL VASCULAR CATHETERIZATION Right 05/15/2016   Procedure: Carotid PTA/Stent Intervention;  Surgeon: Annice Needy, MD;  Location: [facility];  Service: Cardiovascular;  Laterality: Right;  . RESECTION DISTAL CLAVICAL  10/08/2017   Procedure: RESECTION DISTAL CLAVICAL;  Surgeon: Signa Kell, MD;  Location: [facility];  Service: Orthopedics;;  . SHOULDER ARTHROSCOPY WITH SUBACROMIAL DECOMPRESSION Left 10/08/2017   Procedure: SHOULDER ARTHROSCOPY WITH SUBACROMIAL DECOMPRESSION;  Surgeon: Signa Kell, MD;  Location: [facility];  Service: Orthopedics;  Laterality: Left;  . SHOULDER OPEN ROTATOR CUFF REPAIR Left 10/08/2017   Procedure: ROTATOR CUFF REPAIR SHOULDER OPEN Extensive Glenoid Humeral debridement;  Surgeon: Signa Kell, MD;  Location: [facility];  Service: Orthopedics;  Laterality: Left;  . TUBAL LIGATION  1977     Social History   Tobacco Use  . Smoking status: Current Every Day Smoker    Packs/day: 1.00    Years: 45.00    Pack years: 45.00    Types: Cigarettes  . Smokeless tobacco: Never Used  Substance Use Topics  . Alcohol use: Not Currently    Alcohol/week: 0.0 standard drinks  . Drug use: No    Family History  Problem Relation Age of Onset  . Hypertension Mother   . Osteoporosis Mother   . Heart attack Father   . Diabetes Brother   . Breast cancer Neg Hx      Allergies  Allergen Reactions  . Codeine Nausea Only  . Latex     Other reaction(s): Other (See Comments)  . Adhesive [Tape] Itching and Rash  . Penicillins Rash    Tolerates ampicillin  Has patient had a PCN reaction causing immediate rash, facial/tongue/throat swelling, SOB or lightheadedness with hypotension: no Has patient had a PCN reaction causing severe rash involving mucus membranes or skin necrosis: no Has patient had a PCN reaction that required hospitalization {no Has patient had a PCN reaction occurring within the last 10 years: no If all of the above answers are "NO", then may proceed with Cephalosporin use.     REVIEW OF SYSTEMS(Negative unless checked)  Constitutional: [] ???Weight loss[] ???Fever[] ???Chills Cardiac:[] ???Chest pain[] ???Chest pressure[] ???Palpitations [] ???Shortness of breath when laying flat [] ???Shortness of breath at rest [] ???Shortness of breath with exertion. Vascular: [x] ???Pain in legs with walking[] ???Pain in legsat rest[] ???Pain in legs when laying flat [x] ???Claudication [] ???Pain in feet when walking [] ???Pain in feet at  rest [] ???Pain in feet when laying flat [] ???History of DVT [] ???Phlebitis [] ???Swelling in legs [] ???Varicose veins [] ???Non-healing ulcers Pulmonary: [] ???Uses home oxygen [] ???Productive cough[] ???Hemoptysis [] ???Wheeze [] ???COPD [] ???Asthma Neurologic: [x] ???Dizziness [] ???Blackouts [] ???Seizures [] ???History of stroke [] ???History of TIA[] ???Aphasia [x] ???Temporary blindness[] ???Dysphagia [] ???Weaknessor numbness in arms [x] ???Weakness or numbnessin legs Musculoskeletal: [] ???Arthritis [] ???Joint swelling [] ???Joint pain [] ???Low back pain Hematologic:[] ???Easy bruising[] ???Easy bleeding [] ???Hypercoagulable state [] ???Anemic [] ???Hepatitis Gastrointestinal:[] ???Blood in stool[] ???Vomiting blood[] ???Gastroesophageal reflux/heartburn[] ???Difficulty swallowing. Genitourinary: [] ???Chronic kidney disease [] ???Difficulturination [] ???Frequenturination [] ???Burning with urination[] ???Blood in urine Skin: [] ???Rashes [] ???Ulcers [] ???Wounds Psychological: [x] ???History of anxiety[] ???History of major depression    Physical Examination  Vitals:   09/30/19 1416  BP: (!) 144/87  Pulse: 78  Resp: 16  Weight: 151 lb (68.5 kg)  Height: 5\' 5"  (1.651 m)   Body mass index is 25.13 kg/m. Gen:  WD/WN, NAD Head: [place]/AT, No temporalis wasting. Ear/Nose/Throat: Hearing grossly intact, nares w/o erythema or drainage, trachea midline Eyes: Conjunctiva clear. Sclera non-icteric Neck: Supple. Trachea midline Pulmonary:  Good air movement, equal and  clear to auscultation bilaterally.  Cardiac: RRR, No JVD Vascular:  Vessel Right Left  Radial Palpable Palpable                          PT 1+ Palpable Palpable  DP 1+ Palpable Palpable     Musculoskeletal: M/S 5/5 throughout.  No deformity or atrophy. No edema. Neurologic: CN 2-12 intact. Sensation grossly intact in extremities.  Symmetrical.  Speech is  fluent. Motor exam as listed above. Psychiatric: Judgment intact, Mood & affect appropriate for pt's clinical situation. Dermatologic: No rashes or ulcers noted.  No cellulitis or open wounds.      CBC Lab Results  Component Value Date   WBC 5.0 09/12/2019   HGB 14.7 09/12/2019   HCT 43.8 09/12/2019   MCV 93.6 09/12/2019   PLT 212 09/12/2019    BMET    Component Value Date/Time   NA 138 03/19/2019 1327   K 3.8 03/19/2019 1327   CL 102 03/19/2019 1327   CO2 28 03/19/2019 1327   GLUCOSE 96 03/19/2019 1327   BUN 11 03/19/2019 1327   CREATININE 0.61 03/19/2019 1327   CALCIUM 9.4 03/19/2019 1327   GFRNONAA >60 03/19/2019 1327   GFRAA >60 03/19/2019 1327   CrCl cannot be calculated (Patient's most recent lab result is older than the maximum 21 days allowed.).  COAG Lab Results  Component Value Date   INR 0.95 11/17/2015    Radiology No results found.   Assessment/Plan Essential hypertension blood pressure control important in reducing the progression of atherosclerotic disease. On appropriate oral medications.   Hyperlipidemia lipid control important in reducing the progression of atherosclerotic disease. Continue statin therapy  Atherosclerosis of native arteries of extremity with intermittent claudication (HCC) Her ABIs today are a little better on the right at 0.79 although her waveforms remain monophasic.  Her left ABI is 1.22 with triphasic waveforms.  Her symptoms are not disabling and she is not interested in any intervention at this point.  Continue aspirin, Plavix, Crestor, and Zetia.  Recheck in 6 months.  Carotid stenosis Carotid duplex today reveals a widely patent right carotid stent without significant recurrent stenosis.  The left carotid demonstrates 1 to 39% ICA stenosis.  No significant progression from previous study.  She did have a reasonably early recurrence, so would like to continue to follow her on 99-month intervals for now.  She will  continue her current medical regimen as described above.    Festus Barren, MD  09/30/2019 3:03 PM    This note was created with Dragon medical transcription system.  Any errors from dictation are purely unintentional

## 2019-09-30 NOTE — Patient Instructions (Signed)
Peripheral Vascular Disease  Peripheral vascular disease (PVD) is a disease of the blood vessels that are not part of your heart and brain. A simple term for PVD is poor circulation. In most cases, PVD narrows the blood vessels that carry blood from your heart to the rest of your body. This can reduce the supply of blood to your arms, legs, and internal organs, like your stomach or kidneys. However, PVD most often affects a person's lower legs and feet. Without treatment, PVD tends to get worse. PVD can also lead to acute ischemic limb. This is when an arm or leg suddenly cannot get enough blood. This is a medical emergency. Follow these instructions at home: Lifestyle  Do not use any products that contain nicotine or tobacco, such as cigarettes and e-cigarettes. If you need help quitting, ask your doctor.  Lose weight if you are overweight. Or, stay at a healthy weight as told by your doctor.  Eat a diet that is low in fat and cholesterol. If you need help, ask your doctor.  Exercise regularly. Ask your doctor for activities that are right for you. General instructions  Take over-the-counter and prescription medicines only as told by your doctor.  Take good care of your feet: ? Wear comfortable shoes that fit well. ? Check your feet often for any cuts or sores.  Keep all follow-up visits as told by your doctor This is important. Contact a doctor if:  You have cramps in your legs when you walk.  You have leg pain when you are at rest.  You have coldness in a leg or foot.  Your skin changes.  You are unable to get or have an erection (erectile dysfunction).  You have cuts or sores on your feet that do not heal. Get help right away if:  Your arm or leg turns cold, numb, and blue.  Your arms or legs become red, warm, swollen, painful, or numb.  You have chest pain.  You have trouble breathing.  You suddenly have weakness in your face, arm, or leg.  You become very  confused or you cannot speak.  You suddenly have a very bad headache.  You suddenly cannot see. Summary  Peripheral vascular disease (PVD) is a disease of the blood vessels.  A simple term for PVD is poor circulation. Without treatment, PVD tends to get worse.  Treatment may include exercise, low fat and low cholesterol diet, and quitting smoking. This information is not intended to replace advice given to you by your health care provider. Make sure you discuss any questions you have with your health care provider. Document Revised: 06/01/2017 Document Reviewed: 07/27/2016 Elsevier Patient Education  2020 [employer]  

## 2019-11-03 ENCOUNTER — Other Ambulatory Visit: Payer: Self-pay | Admitting: Internal Medicine

## 2019-11-03 DIAGNOSIS — Z1231 Encounter for screening mammogram for malignant neoplasm of breast: Secondary | ICD-10-CM

## 2019-11-04 ENCOUNTER — Ambulatory Visit
Admission: RE | Admit: 2019-11-04 | Discharge: 2019-11-04 | Disposition: A | Discharge status: Home / Self Care | Payer: Medicare HMO | Source: Ambulatory Visit | Attending: Internal Medicine | Admitting: Internal Medicine

## 2019-11-04 DIAGNOSIS — Z1231 Encounter for screening mammogram for malignant neoplasm of breast: Secondary | ICD-10-CM | POA: Diagnosis not present

## 2019-12-04 ENCOUNTER — Telehealth: Payer: Self-pay

## 2019-12-04 DIAGNOSIS — Z122 Encounter for screening for malignant neoplasm of respiratory organs: Secondary | ICD-10-CM

## 2019-12-04 DIAGNOSIS — Z87891 Personal history of nicotine dependence: Secondary | ICD-10-CM

## 2019-12-04 NOTE — Telephone Encounter (Signed)
Patient has been notified that the low dose lung cancer screening CT scan is due currently or will be in near future.  Confirmed that patient is within the appropriate age range and asymptomatic, (no signs or symptoms of lung cancer).  Patient denies illness that would prevent curative treatment for lung cancer if found.  Patient is agreeable for CT scan being scheduled.    Verified smoking history (current smoker, with 45 year 1 ppd history).   CT scheduled for 12/18/19 @ 12:30

## 2019-12-08 NOTE — Telephone Encounter (Signed)
Smoking history: current, 46 pack year 

## 2019-12-08 NOTE — Addendum Note (Signed)
Addended by: Jonne Ply on: 12/08/2019 11:57 AM   Modules accepted: Orders

## 2019-12-18 ENCOUNTER — Ambulatory Visit
Admission: RE | Admit: 2019-12-18 | Discharge: 2019-12-18 | Disposition: A | Discharge status: Home / Self Care | Payer: Medicare HMO | Source: Ambulatory Visit | Attending: Oncology | Admitting: Oncology

## 2019-12-18 ENCOUNTER — Other Ambulatory Visit: Payer: Self-pay

## 2019-12-18 DIAGNOSIS — Z122 Encounter for screening for malignant neoplasm of respiratory organs: Secondary | ICD-10-CM | POA: Insufficient documentation

## 2019-12-18 DIAGNOSIS — Z87891 Personal history of nicotine dependence: Secondary | ICD-10-CM | POA: Insufficient documentation

## 2019-12-23 ENCOUNTER — Encounter: Payer: Self-pay | Admitting: *Deleted

## 2020-03-15 ENCOUNTER — Inpatient Hospital Stay (HOSPITAL_BASED_OUTPATIENT_CLINIC_OR_DEPARTMENT_OTHER): Payer: Medicare HMO | Admitting: Oncology

## 2020-03-15 ENCOUNTER — Inpatient Hospital Stay: Payer: Medicare HMO | Attending: Oncology

## 2020-03-15 ENCOUNTER — Other Ambulatory Visit: Payer: Self-pay

## 2020-03-15 ENCOUNTER — Encounter: Payer: Self-pay | Admitting: Oncology

## 2020-03-15 VITALS — BP 140/74 | HR 66 | Temp 98.8°F | Resp 16 | Wt 151.4 lb

## 2020-03-15 DIAGNOSIS — D582 Other hemoglobinopathies: Secondary | ICD-10-CM | POA: Diagnosis not present

## 2020-03-15 DIAGNOSIS — F17210 Nicotine dependence, cigarettes, uncomplicated: Secondary | ICD-10-CM | POA: Diagnosis not present

## 2020-03-15 DIAGNOSIS — Z87891 Personal history of nicotine dependence: Secondary | ICD-10-CM

## 2020-03-15 DIAGNOSIS — D751 Secondary polycythemia: Secondary | ICD-10-CM

## 2020-03-15 DIAGNOSIS — Z90710 Acquired absence of both cervix and uterus: Secondary | ICD-10-CM | POA: Diagnosis not present

## 2020-03-15 LAB — CBC WITH DIFFERENTIAL/PLATELET
Abs Immature Granulocytes: 0.01 10*3/uL (ref 0.00–0.07)
Basophils Absolute: 0.1 10*3/uL (ref 0.0–0.1)
Basophils Relative: 1 %
Eosinophils Absolute: 0.2 10*3/uL (ref 0.0–0.5)
Eosinophils Relative: 3 %
HCT: 43.8 % (ref 36.0–46.0)
Hemoglobin: 15.3 g/dL — ABNORMAL HIGH (ref 12.0–15.0)
Immature Granulocytes: 0 %
Lymphocytes Relative: 26 %
Lymphs Abs: 1.4 10*3/uL (ref 0.7–4.0)
MCH: 31.7 pg (ref 26.0–34.0)
MCHC: 34.9 g/dL (ref 30.0–36.0)
MCV: 90.9 fL (ref 80.0–100.0)
Monocytes Absolute: 0.6 10*3/uL (ref 0.1–1.0)
Monocytes Relative: 11 %
Neutro Abs: 3.2 10*3/uL (ref 1.7–7.7)
Neutrophils Relative %: 59 %
Platelets: 242 10*3/uL (ref 150–400)
RBC: 4.82 MIL/uL (ref 3.87–5.11)
RDW: 13.4 % (ref 11.5–15.5)
WBC: 5.4 10*3/uL (ref 4.0–10.5)
nRBC: 0 % (ref 0.0–0.2)

## 2020-03-15 LAB — COMPREHENSIVE METABOLIC PANEL
ALT: 34 U/L (ref 0–44)
AST: 40 U/L (ref 15–41)
Albumin: 4.8 g/dL (ref 3.5–5.0)
Alkaline Phosphatase: 63 U/L (ref 38–126)
Anion gap: 10 (ref 5–15)
BUN: 7 mg/dL — ABNORMAL LOW (ref 8–23)
CO2: 29 mmol/L (ref 22–32)
Calcium: 9.2 mg/dL (ref 8.9–10.3)
Chloride: 99 mmol/L (ref 98–111)
Creatinine, Ser: 0.56 mg/dL (ref 0.44–1.00)
GFR calc Af Amer: >60 mL/min (ref >60)
GFR calc non Af Amer: >60 mL/min (ref >60)
Glucose, Bld: 88 mg/dL (ref 70–99)
Potassium: 3.2 mmol/L — ABNORMAL LOW (ref 3.5–5.1)
Sodium: 138 mmol/L (ref 135–145)
Total Bilirubin: 0.6 mg/dL (ref 0.3–1.2)
Total Protein: 7.9 g/dL (ref 6.5–8.1)

## 2020-03-15 NOTE — Progress Notes (Signed)
Offers no complaints today. 

## 2020-03-15 NOTE — Progress Notes (Signed)
Hematology/Oncology progress note [facility] Telephone:(336) [phone] Fax:(336) [phone]   Patient Care Team: Sherron Monday, MD as PCP - General (Internal Medicine) Sherron Monday, MD as Referring Physician (Internal Medicine) Kieth Brightly, MD (General Surgery)  REFERRING PROVIDER: Sherron Monday, MD  CHIEF COMPLAINTS/REASON FOR VISIT:  Follow up for erythrocytosis  HISTORY OF PRESENTING ILLNESS:  Bethany Lozano is a  69 y.o.  female with PMH listed below who was referred to me for evaluation of polycytosis/erythrocytosis Reviewed patient's recent lab work which was obtain by PCP.  CBC 07/10/2018 showed elevated hemoglobin at 16.2,  total white count 4.5 platelet counts 225,000.  Chronic Onset, duration since 2018.  No aggravating or alleviating factors.   Associated signs or symptoms: Denies weight loss, fever, chills, fatigue, night sweats.   Context:  Smoking history: current daily smoker.  History of blood clots: denies Family history of polycythemia: denies.   Per patient, she has had nocturnal oxymetry done which showed decreased oxygen level at night. Has not had sleep study done.    INTERVAL HISTORY Bethany Lozano is a 69 y.o. female who has above history reviewed by me today presents for follow up visit for management of erythrocytosis Problems and complaints are listed below: She continues to smoke daily. She has had CT lung cancer screening on 12/18/2019, lung RAD 2S, benign.   Patient has no new complaints today.  Continues to smoke daily.  She Has had a CT chest lung cancer screening done on 12/11/2018.  Lung RADS 2 benign appearance.  Review of Systems  Constitutional: Negative for appetite change, chills, fatigue and fever.  HENT:   Negative for hearing loss and voice change.   Eyes: Negative for eye problems.  Respiratory: Negative for chest tightness, cough and shortness of breath.   Cardiovascular: Negative for  chest pain.  Gastrointestinal: Negative for abdominal distention, abdominal pain and blood in stool.  Endocrine: Negative for hot flashes.  Genitourinary: Negative for difficulty urinating and frequency.   Musculoskeletal: Negative for arthralgias.  Skin: Negative for itching and rash.  Neurological: Negative for extremity weakness.  Hematological: Negative for adenopathy.  Psychiatric/Behavioral: Negative for confusion.    MEDICAL HISTORY:  Past Medical History:  Diagnosis Date  . Anxiety   . Depression   . Heart murmur   . Hemorrhoids   . Hyperlipidemia   . Peripheral vascular disease (HCC)   . Sinusitis   . Tobacco use     SURGICAL HISTORY: Past Surgical History:  Procedure Laterality Date  . ABDOMINAL HYSTERECTOMY  1991  . APPENDECTOMY    . BICEPT TENODESIS Left 10/08/2017   Procedure: BICEPS TENODESIS;  Surgeon: Signa Kell, MD;  Location: [facility] ORS;  Service: Orthopedics;  Laterality: Left;  . CARDIAC CATHETERIZATION N/A 11/08/2015   Procedure: Left Heart Cath and Coronary Angiography;  Surgeon: Laurier Nancy, MD;  Location: [facility] INVASIVE CV LAB;  Service: Cardiovascular;  Laterality: N/A;  . CARDIAC CATHETERIZATION     Mynx placed in right artery after heart catherization  . CAROTID STENT INSERTION Right 05/15/2016  . COLONOSCOPY  2014  . ENDARTERECTOMY Right 11/25/2015   Procedure: ENDARTERECTOMY CAROTID;  Surgeon: Annice Needy, MD;  Location: [facility] ORS;  Service: Vascular;  Laterality: Right;  . FISSURECTOMY  10/22/14  . HEMORRHOIDECTOMY WITH HEMORRHOID BANDING  1991,06/20/14, 10/22/14  . PERIPHERAL VASCULAR CATHETERIZATION Right 05/15/2016   Procedure: Carotid PTA/Stent Intervention;  Surgeon: Annice Needy, MD;  Location: [facility] INV[facility] CV [facility];  Service:  Cardiovascular;  Laterality: Right;  . RESECTION DISTAL CLAVICAL  10/08/2017   Procedure: RESECTION DISTAL CLAVICAL;  Surgeon: Signa Kell, MD;  Location: [facility] ORS;  Service: Orthopedics;;  . SHOULDER ARTHROSCOPY WITH  SUBACROMIAL DECOMPRESSION Left 10/08/2017   Procedure: SHOULDER ARTHROSCOPY WITH SUBACROMIAL DECOMPRESSION;  Surgeon: Signa Kell, MD;  Location: [facility] ORS;  Service: Orthopedics;  Laterality: Left;  . SHOULDER OPEN ROTATOR CUFF REPAIR Left 10/08/2017   Procedure: ROTATOR CUFF REPAIR SHOULDER OPEN Extensive Glenoid Humeral debridement;  Surgeon: Signa Kell, MD;  Location: [facility] ORS;  Service: Orthopedics;  Laterality: Left;  . TUBAL LIGATION  1977    SOCIAL HISTORY: Social History   Socioeconomic History  . Marital status: Single    Spouse name: Not on file  . Number of children: Not on file  . Years of education: Not on file  . Highest education level: Not on file  Occupational History  . Occupation: retired  Tobacco Use  . Smoking status: Current Every Day Smoker    Packs/day: 1.00    Years: 45.00    Pack years: 45.00    Types: Cigarettes  . Smokeless tobacco: Never Used  Vaping Use  . Vaping Use: Never used  Substance and Sexual Activity  . Alcohol use: Not Currently    Alcohol/week: 0.0 standard drinks  . Drug use: No  . Sexual activity: Not on file  Other Topics Concern  . Not on file  Social History Narrative  . Not on file   Social Determinants of Health   Financial Resource Strain:   . Difficulty of Paying Living Expenses: Not on file  Food Insecurity:   . Worried About Programme researcher, broadcasting/film/video in the Last Year: Not on file  . Ran Out of Food in the Last Year: Not on file  Transportation Needs:   . Lack of Transportation (Medical): Not on file  . Lack of Transportation (Non-Medical): Not on file  Physical Activity:   . Days of Exercise per Week: Not on file  . Minutes of Exercise per Session: Not on file  Stress:   . Feeling of Stress : Not on file  Social Connections:   . Frequency of Communication with Friends and Family: Not on file  . Frequency of Social Gatherings with Friends and Family: Not on file  . Attends Religious Services: Not on file  . Active  Member of Clubs or Organizations: Not on file  . Attends Banker Meetings: Not on file  . Marital Status: Not on file  Intimate Partner Violence:   . Fear of Current or Ex-Partner: Not on file  . Emotionally Abused: Not on file  . Physically Abused: Not on file  . Sexually Abused: Not on file    FAMILY HISTORY: Family History  Problem Relation Age of Onset  . Hypertension Mother   . Osteoporosis Mother   . Heart attack Father   . Diabetes Brother   . Breast cancer Neg Hx     ALLERGIES:  is allergic to codeine, latex, adhesive [tape], and penicillins.  MEDICATIONS:  Current Outpatient Medications  Medication Sig Dispense Refill  . aspirin (ADULT ASPIRIN EC LOW STRENGTH) 81 MG EC tablet Take 81 mg by mouth daily.    . Bempedoic Acid-Ezetimibe (NEXLIZET) 180-10 MG TABS Take 180 tablets by mouth daily.    . chlorthalidone (HYGROTON) 25 MG tablet Take 25 mg by mouth daily.   0  . Cholecalciferol (VITAMIN D3) 5000 units CAPS Take 1,000 Units by mouth daily.     Marland Kitchen  clonazePAM (KLONOPIN) 0.5 MG tablet Take 0.5 mg by mouth daily.    . clopidogrel (PLAVIX) 75 MG tablet TAKE 1 TABLET BY MOUTH EVERY DAY 90 tablet 3  . DULoxetine (CYMBALTA) 30 MG capsule Take 60 mg by mouth daily.     Marland Kitchen estradiol (ESTRACE) 0.5 MG tablet Take 0.5 mg by mouth every morning.  0  . fluticasone (FLONASE) 50 MCG/ACT nasal spray Place 1 spray into both nostrils daily as needed for allergies or rhinitis.   0  . Melatonin 10 MG TABS Take 10 mg by mouth at bedtime.    . rosuvastatin (CRESTOR) 40 MG tablet Take 40 mg by mouth at bedtime.     . Coenzyme Q10 (CO Q 10) 100 MG CAPS Take 100 mg by mouth daily.    Marland Kitchen ezetimibe (ZETIA) 10 MG tablet Take 10 mg by mouth daily.    . Melatonin 5 MG CAPS Take 5 mg by mouth as needed.     No current facility-administered medications for this visit.     PHYSICAL EXAMINATION: ECOG PERFORMANCE STATUS: 0 - Asymptomatic Vitals:   03/15/20 1313  BP: 140/74  Pulse:  66  Resp: 16  Temp: 98.8 F (37.1 C)   Filed Weights   03/15/20 1313  Weight: 151 lb 6.4 oz (68.7 kg)    Physical Exam Constitutional:      General: She is not in acute distress. HENT:     Head: Normocephalic and atraumatic.  Eyes:     General: No scleral icterus.    Pupils: Pupils are equal, round, and reactive to light.  Cardiovascular:     Rate and Rhythm: Normal rate and regular rhythm.     Heart sounds: Normal heart sounds.  Pulmonary:     Effort: Pulmonary effort is normal. No respiratory distress.     Breath sounds: No wheezing.     Comments: Decreased breath sounds bilaterally.  Abdominal:     General: Bowel sounds are normal. There is no distension.     Palpations: Abdomen is soft. There is no mass.     Tenderness: There is no abdominal tenderness.  Musculoskeletal:        General: No deformity. Normal range of motion.     Cervical back: Normal range of motion and neck supple.  Skin:    General: Skin is warm and dry.     Findings: No erythema or rash.  Neurological:     Mental Status: She is alert and oriented to person, place, and time.     Cranial Nerves: No cranial nerve deficit.     Coordination: Coordination normal.  Psychiatric:        Behavior: Behavior normal.        Thought Content: Thought content normal.      LABORATORY DATA:  I have reviewed the data as listed Lab Results  Component Value Date   WBC 5.4 03/15/2020   HGB 15.3 (H) 03/15/2020   HCT 43.8 03/15/2020   MCV 90.9 03/15/2020   PLT 242 03/15/2020   Recent Labs    03/19/19 1327 03/15/20 1241  NA 138 138  K 3.8 3.2*  CL 102 99  CO2 28 29  GLUCOSE 96 88  BUN 11 7*  CREATININE 0.61 0.56  CALCIUM 9.4 9.2  GFRNONAA >60 >60  GFRAA >60 >60  PROT 7.6 7.9  ALBUMIN 4.5 4.8  AST 33 40  ALT 29 34  ALKPHOS 73 63  BILITOT 0.6 0.6   Iron/TIBC/Ferritin/ %Sat No results  found for: IRON, TIBC, FERRITIN, IRONPCTSAT   RADIOGRAPHIC STUDIES: I have personally reviewed the  radiological images as listed and agreed with the findings in the report. 04/04/2017 US abdomen Limited RUQ Normal right upper quadrant ultrasound.  CT CHEST LUNG CANCER SCREENING LOW DOSE WO CONTRAST  Result Date: 12/19/2019 CLINICAL DATA:  69 year old female current smoker with 46 pack-year history of smoking. Lung cancer screening examination. EXAM: CT CHEST WITHOUT CONTRAST LOW-DOSE FOR LUNG CANCER SCREENING TECHNIQUE: Multidetector CT imaging of the chest was performed following the standard protocol without IV contrast. COMPARISON:  Chest CT 12/11/2018. FINDINGS: Cardiovascular: Heart size is normal. There is no significant pericardial fluid, thickening or pericardial calcification. There is aortic atherosclerosis, as well as atherosclerosis of the great vessels of the mediastinum and the coronary arteries, including calcified atherosclerotic plaque in the left main, left anterior descending, left circumflex and right coronary arteries. Mild calcification of the aortic valve. Mediastinum/Nodes: No pathologically enlarged mediastinal or hilar lymph nodes. Please note that accurate exclusion of hilar adenopathy is limited on noncontrast CT scans. Esophagus is unremarkable in appearance. No axillary lymphadenopathy. Lungs/Pleura: Multiple small calcified and noncalcified pulmonary nodules are noted in the lungs bilaterally. The largest noncalcified pulmonary nodule is in the right middle lobe (axial image 237 of series 3), with a volume derived mean diameter of 3.5 mm. No other larger more suspicious appearing pulmonary nodules or masses are noted. No acute consolidative airspace disease. No pleural effusions. Diffuse bronchial wall thickening with mild centrilobular and paraseptal emphysema. Upper Abdomen: Aortic atherosclerosis. Musculoskeletal: There are no aggressive appearing lytic or blastic lesions noted in the visualized portions of the skeleton. IMPRESSION: 1. Lung-RADS 2S, benign appearance or  behavior. Continue annual screening with low-dose chest CT without contrast in 12 months. 2. The "S" modifier above refers to potentially clinically significant non lung cancer related findings. Specifically, there is aortic atherosclerosis, in addition to left main and 3 vessel coronary artery disease. Please note that although the presence of coronary artery calcium documents the presence of coronary artery disease, the severity of this disease and any potential stenosis cannot be assessed on this non-gated CT examination. Assessment for potential risk factor modification, dietary therapy or pharmacologic therapy may be warranted, if clinically indicated. 3. Mild diffuse bronchial wall thickening with mild centrilobular and paraseptal emphysema; imaging findings suggestive of underlying COPD. 4. There are calcifications of the aortic valve. Echocardiographic correlation for evaluation of potential valvular dysfunction may be warranted if clinically indicated. Aortic Atherosclerosis (ICD10-I70.0) and Emphysema (ICD10-J43.9). Electronically Signed   By: Trudie Reedaniel  Entrikin M.D.   On: 12/19/2019 09:17     ASSESSMENT & PLAN:  1. Secondary erythrocytosis   2. 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  Labs are reviewed and discussed with patient. Hemoglobin is mildly elevated, HCT is 43. No need for phlebotomy.  Discussed about tobacco cessation.  She is up to dated on lung cancer screening.   Follow up in 6 months with repeat CBC.   Orders Placed This Encounter  Procedures  . CBC with Differential/Platelet    Standing Status:   Future    Standing Expiration Date:   03/15/2021  . Comprehensive metabolic panel    Standing Status:   Future    Standing Expiration Date:   03/15/2021    We spent sufficient time to discuss many aspect of care, questions were answered to patient's satisfaction. The patient knows to call the clinic with any  problems questions or concerns.  Return of visit: 6 months  Rickard Patience, MD, PhD Hematology Oncology [facility] Pager- [phone] 03/15/2020

## 2020-04-20 ENCOUNTER — Ambulatory Visit (INDEPENDENT_AMBULATORY_CARE_PROVIDER_SITE_OTHER): Payer: Medicare HMO

## 2020-04-20 ENCOUNTER — Ambulatory Visit (INDEPENDENT_AMBULATORY_CARE_PROVIDER_SITE_OTHER): Payer: Medicare HMO | Admitting: Vascular Surgery

## 2020-04-20 ENCOUNTER — Encounter (INDEPENDENT_AMBULATORY_CARE_PROVIDER_SITE_OTHER): Payer: Medicare HMO | Admitting: Nurse Practitioner

## 2020-04-20 ENCOUNTER — Other Ambulatory Visit: Payer: Self-pay

## 2020-04-20 ENCOUNTER — Encounter (INDEPENDENT_AMBULATORY_CARE_PROVIDER_SITE_OTHER): Payer: Self-pay | Admitting: Vascular Surgery

## 2020-04-20 VITALS — BP 171/78 | HR 65 | Resp 16 | Wt 151.6 lb

## 2020-04-20 DIAGNOSIS — E785 Hyperlipidemia, unspecified: Secondary | ICD-10-CM | POA: Diagnosis not present

## 2020-04-20 DIAGNOSIS — I6523 Occlusion and stenosis of bilateral carotid arteries: Secondary | ICD-10-CM | POA: Diagnosis not present

## 2020-04-20 DIAGNOSIS — I10 Essential (primary) hypertension: Secondary | ICD-10-CM

## 2020-04-20 DIAGNOSIS — I70211 Atherosclerosis of native arteries of extremities with intermittent claudication, right leg: Secondary | ICD-10-CM

## 2020-04-20 NOTE — Assessment & Plan Note (Signed)
Her noninvasive studies do show a drop in her ABIs bilaterally.  On the right, she is 0.56 and on the left she is 0.74.  She has reduced digital pressures bilaterally.  Previously, her ABIs were 0.79 on the right and 1.2 on the left.  She still is not severely symptomatic and does not want to have any intervention unless she absolutely has to.  I think we are okay at this point.  If she develops limb threatening symptoms, that would obviously change things.

## 2020-04-20 NOTE — Progress Notes (Signed)
MRN : [id number]  CADYNCE GARRETTE is a 69 y.o. (1950-09-25) female who presents with chief complaint of  Chief Complaint  Patient presents with  . Follow-up    ultrasound follow up  .  History of Present Illness: Patient returns in follow-up of multiple vascular issues.  She is doing reasonably well today.  She has some claudication symptoms in her leg but she says they are no worse than they were 6 months ago.  No new ulceration or infection.  No rest pain.  Her noninvasive studies do show a drop in her ABIs bilaterally.  On the right, she is 0.56 and on the left she is 0.74.  She has reduced digital pressures bilaterally.  Previously, her ABIs were 0.79 on the right and 1.2 on the left. She is also followed for carotid disease.  She had a carotid endarterectomy about 4 to 5 years ago with a subsequent recurrence less than a year after her surgery which was treated with a carotid stent.  She has done well.  No recent focal neurologic symptoms. Specifically, the patient denies amaurosis fugax, speech or swallowing difficulties, or arm or leg weakness or numbness. Carotid duplex today shows a right carotid stent to be widely patent and the left carotid to remain in the 1 to 39% range.  No significant progression from her previous study.  Current Outpatient Medications  Medication Sig Dispense Refill  . aspirin (ADULT ASPIRIN EC LOW STRENGTH) 81 MG EC tablet Take 81 mg by mouth daily.    . Bempedoic Acid-Ezetimibe (NEXLIZET) 180-10 MG TABS Take 180 tablets by mouth daily.    . chlorthalidone (HYGROTON) 25 MG tablet Take 25 mg by mouth daily.   0  . Cholecalciferol (VITAMIN D3) 5000 units CAPS Take 1,000 Units by mouth daily.     . clonazePAM (KLONOPIN) 0.5 MG tablet Take 0.5 mg by mouth daily.    . clopidogrel (PLAVIX) 75 MG tablet TAKE 1 TABLET BY MOUTH EVERY DAY 90 tablet 3  . DULoxetine (CYMBALTA) 30 MG capsule Take 60 mg by mouth daily.     Marland Kitchen estradiol (ESTRACE) 0.5 MG tablet Take 0.5 mg by  mouth every morning.  0  . fluticasone (FLONASE) 50 MCG/ACT nasal spray Place 1 spray into both nostrils daily as needed for allergies or rhinitis.   0  . Melatonin 10 MG TABS Take 10 mg by mouth at bedtime.    . rosuvastatin (CRESTOR) 40 MG tablet Take 40 mg by mouth at bedtime.     . Coenzyme Q10 (CO Q 10) 100 MG CAPS Take 100 mg by mouth daily.    Marland Kitchen ezetimibe (ZETIA) 10 MG tablet Take 10 mg by mouth daily.    . Melatonin 5 MG CAPS Take 5 mg by mouth as needed.     No current facility-administered medications for this visit.    Past Medical History:  Diagnosis Date  . Anxiety   . Depression   . Heart murmur   . Hemorrhoids   . Hyperlipidemia   . Peripheral vascular disease (HCC)   . Sinusitis   . Tobacco use     Past Surgical History:  Procedure Laterality Date  . ABDOMINAL HYSTERECTOMY  1991  . APPENDECTOMY    . BICEPT TENODESIS Left 10/08/2017   Procedure: BICEPS TENODESIS;  Surgeon: Signa Kell, MD;  Location: [facility];  Service: Orthopedics;  Laterality: Left;  . CARDIAC CATHETERIZATION N/A 11/08/2015   Procedure: Left Heart Cath and Coronary Angiography;  Surgeon: Laurier Nancy, MD;  Location: [facility];  Service: Cardiovascular;  Laterality: N/A;  . CARDIAC CATHETERIZATION     Mynx placed in right artery after heart catherization  . CAROTID STENT INSERTION Right 05/15/2016  . COLONOSCOPY  2014  . ENDARTERECTOMY Right 11/25/2015   Procedure: ENDARTERECTOMY CAROTID;  Surgeon: Annice Needy, MD;  Location: [facility];  Service: Vascular;  Laterality: Right;  . FISSURECTOMY  10/22/14  . HEMORRHOIDECTOMY WITH HEMORRHOID BANDING  1991,06/20/14, 10/22/14  . PERIPHERAL VASCULAR CATHETERIZATION Right 05/15/2016   Procedure: Carotid PTA/Stent Intervention;  Surgeon: Annice Needy, MD;  Location: [facility];  Service: Cardiovascular;  Laterality: Right;  . RESECTION DISTAL CLAVICAL  10/08/2017   Procedure: RESECTION DISTAL CLAVICAL;  Surgeon: Signa Kell, MD;   Location: [facility];  Service: Orthopedics;;  . SHOULDER ARTHROSCOPY WITH SUBACROMIAL DECOMPRESSION Left 10/08/2017   Procedure: SHOULDER ARTHROSCOPY WITH SUBACROMIAL DECOMPRESSION;  Surgeon: Signa Kell, MD;  Location: [facility];  Service: Orthopedics;  Laterality: Left;  . SHOULDER OPEN ROTATOR CUFF REPAIR Left 10/08/2017   Procedure: ROTATOR CUFF REPAIR SHOULDER OPEN Extensive Glenoid Humeral debridement;  Surgeon: Signa Kell, MD;  Location: [facility];  Service: Orthopedics;  Laterality: Left;  . TUBAL LIGATION  1977     Social History   Tobacco Use  . Smoking status: Current Every Day Smoker    Packs/day: 1.00    Years: 45.00    Pack years: 45.00    Types: Cigarettes  . Smokeless tobacco: Never Used  Vaping Use  . Vaping Use: Never used  Substance Use Topics  . Alcohol use: Not Currently    Alcohol/week: 0.0 standard drinks  . Drug use: No     Family History  Problem Relation Age of Onset  . Hypertension Mother   . Osteoporosis Mother   . Heart attack Father   . Diabetes Brother   . Breast cancer Neg Hx     Allergies  Allergen Reactions  . Codeine Nausea Only  . Latex     Other reaction(s): Other (See Comments)  . Adhesive [Tape] Itching and Rash  . Penicillins Rash    Tolerates ampicillin  Has patient had a PCN reaction causing immediate rash, facial/tongue/throat swelling, SOB or lightheadedness with hypotension: no Has patient had a PCN reaction causing severe rash involving mucus membranes or skin necrosis: no Has patient had a PCN reaction that required hospitalization {no Has patient had a PCN reaction occurring within the last 10 years: no If all of the above answers are "NO", then may proceed with Cephalosporin use.    REVIEW OF SYSTEMS(Negative unless checked)  Constitutional: [] ????Weight loss[] ????Fever[] ????Chills Cardiac:[] ????Chest pain[] ????Chest pressure[] ????Palpitations [] ????Shortness of breath when laying flat [] ????Shortness  of breath at rest [] ????Shortness of breath with exertion. Vascular: [x] ????Pain in legs with walking[] ????Pain in legsat rest[] ????Pain in legs when laying flat [x] ????Claudication [] ????Pain in feet when walking [] ????Pain in feet at rest [] ????Pain in feet when laying flat [] ????History of DVT [] ????Phlebitis [] ????Swelling in legs [] ????Varicose veins [] ????Non-healing ulcers Pulmonary: [] ????Uses home oxygen [] ????Productive cough[] ????Hemoptysis [] ????Wheeze [] ????COPD [] ????Asthma Neurologic: [x] ????Dizziness [] ????Blackouts [] ????Seizures [] ????History of stroke [] ????History of TIA[] ????Aphasia [x] ????Temporary blindness[] ????Dysphagia [] ????Weaknessor numbness in arms [x] ????Weakness or numbnessin legs Musculoskeletal: [] ????Arthritis [] ????Joint swelling [] ????Joint pain [] ????Low back pain Hematologic:[] ????Easy bruising[] ????Easy bleeding [] ????Hypercoagulable state [] ????Anemic [] ????Hepatitis Gastrointestinal:[] ????Blood in stool[] ????Vomiting blood[] ????Gastroesophageal reflux/heartburn[] ????Difficulty swallowing. Genitourinary: [] ????Chronic kidney disease [] ????Difficulturination [] ????Frequenturination [] ????Burning with urination[] ????Blood in urine Skin: [] ????Rashes [] ????Ulcers [] ????Wounds Psychological: [x] ????History of anxiety[] ????History of major depression   Physical Examination  Vitals:   04/20/20 1413  BP: (!) 171/78  Pulse: 65  Resp: 16  Weight: 151 lb 9.6 oz (68.8 kg)   Body mass index is 25.23 kg/m. Gen:  WD/WN, NAD Head: /AT, No temporalis wasting. Ear/Nose/Throat: Hearing grossly intact, nares w/o erythema or drainage, trachea midline Eyes: Conjunctiva clear. Sclera non-icteric Neck: Supple.  No bruit  Pulmonary:  Good air movement, equal and clear to auscultation bilaterally.  Cardiac: RRR, No JVD Vascular:  Vessel Right Left  Radial Palpable  Palpable   Musculoskeletal: M/S 5/5 throughout.  No deformity or atrophy. No edema. Neurologic: CN 2-12 intact. Sensation grossly intact in extremities.  Symmetrical.  Speech is fluent. Motor exam as listed above. Psychiatric: Judgment intact, Mood & affect appropriate for pt's clinical situation. Dermatologic: No rashes or ulcers noted.  No cellulitis or open wounds. Lymph : No Cervical, Axillary, or Inguinal lymphadenopathy.     CBC Lab Results  Component Value Date   WBC 5.4 03/15/2020   HGB 15.3 (H) 03/15/2020   HCT 43.8 03/15/2020   MCV 90.9 03/15/2020   PLT 242 03/15/2020    BMET    Component Value Date/Time   NA 138 03/15/2020 1241   K 3.2 (L) 03/15/2020 1241   CL 99 03/15/2020 1241   CO2 29 03/15/2020 1241   GLUCOSE 88 03/15/2020 1241   BUN 7 (L) 03/15/2020 1241   CREATININE 0.56 03/15/2020 1241   CALCIUM 9.2 03/15/2020 1241   GFRNONAA >60 03/15/2020 1241   GFRAA >60 03/15/2020 1241   CrCl cannot be calculated (Patient's most recent lab result is older than the maximum 21 days allowed.).  COAG Lab Results  Component Value Date   INR 0.95 11/17/2015    Radiology VAS [place] ABI WITH/WO TBI  Result Date: 04/20/2020 LOWER EXTREMITY DOPPLER STUDY Indications: Peripheral artery disease.  Comparison Study: 09/30/2019 Performing Technologist: Reece Agar RT (R)(VS)  Examination Guidelines: A complete evaluation includes at minimum, Doppler waveform signals and systolic blood pressure reading at the level of bilateral brachial, anterior tibial, and posterior tibial arteries, when vessel segments are accessible. Bilateral testing is considered an integral part of a complete examination. Photoelectric Plethysmograph (PPG) waveforms and toe systolic pressure readings are included as required and additional duplex testing as needed. Limited examinations for reoccurring indications may be performed as noted.  ABI Findings:  +---------+------------------+-----+----------+--------+ Right    Rt Pressure (mmHg)IndexWaveform  Comment  +---------+------------------+-----+----------+--------+ Brachial 150                                       +---------+------------------+-----+----------+--------+ ATA      92                0.56 monophasic         +---------+------------------+-----+----------+--------+ PTA      85                0.52 monophasic         +---------+------------------+-----+----------+--------+ Great Toe64                0.39 Abnormal           +---------+------------------+-----+----------+--------+ +---------+------------------+-----+---------+-------+ Left     Lt Pressure (mmHg)IndexWaveform Comment +---------+------------------+-----+---------+-------+ Brachial 164                                     +---------+------------------+-----+---------+-------+ ATA  121               0.74 triphasic        +---------+------------------+-----+---------+-------+ PTA      84                0.51 biphasic         +---------+------------------+-----+---------+-------+ Great Toe11                0.07 Abnormal         +---------+------------------+-----+---------+-------+ +-------+-----------+-----------+------------+------------+ ABI/TBIToday's ABIToday's TBIPrevious ABIPrevious TBI +-------+-----------+-----------+------------+------------+ Right  .56        .39        .79         .52          +-------+-----------+-----------+------------+------------+ Left   .74        .07        1.22        .62          +-------+-----------+-----------+------------+------------+ Right ABIs appear essentially unchanged compared to prior study on 09/30/2019. Left ABIs appear decreased compared to prior study on 09/30/2019. Bilateral TBI's appear decreased as compared to the previous exam on 09/30/2019. Summary: Right: Resting right ankle-brachial index indicates moderate  right lower extremity arterial disease. The right toe-brachial index is abnormal. Left: Resting left ankle-brachial index indicates moderate left lower extremity arterial disease. The left toe-brachial index is abnormal. *See table(s) above for measurements and observations.  Electronically signed by Festus Barren MD on 04/20/2020 at 2:30:04 PM.   Final    VAS US CAROTID  Result Date: 04/20/2020 Carotid Arterial Duplex Study Other Factors:     11/25/2015 Rt CEA; 05/2016 Rt stent. Comparison Study:  09/30/2019 Performing Technologist: Reece Agar RT (R)(VS)  Examination Guidelines: A complete evaluation includes B-mode imaging, spectral Doppler, color Doppler, and power Doppler as needed of all accessible portions of each vessel. Bilateral testing is considered an integral part of a complete examination. Limited examinations for reoccurring indications may be performed as noted.  Right Carotid Findings: +----------+--------+--------+--------+------------------+--------------------+           PSV cm/sEDV cm/sStenosisPlaque DescriptionComments             +----------+--------+--------+--------+------------------+--------------------+ CCA Prox  77      15                                                     +----------+--------+--------+--------+------------------+--------------------+ CCA Mid   80      23                                                     +----------+--------+--------+--------+------------------+--------------------+ CCA Distal71      11                                                     +----------+--------+--------+--------+------------------+--------------------+ ICA Prox  65      17  Stent                +----------+--------+--------+--------+------------------+--------------------+ ICA Mid   90      22                                Stent                 +----------+--------+--------+--------+------------------+--------------------+ ICA Distal109     14                                ICA/CCA ratio = 1.42 +----------+--------+--------+--------+------------------+--------------------+ ECA       121     17                                                     +----------+--------+--------+--------+------------------+--------------------+ +----------+--------+-------+-----------+-------------------+           PSV cm/sEDV cmsDescribe   Arm Pressure (mmHG) +----------+--------+-------+-----------+-------------------+ [id number]            Multiphasic                    +----------+--------+-------+-----------+-------------------+ +---------+--------+--+--------+--+---------+ VertebralPSV cm/s84EDV cm/s12Antegrade +---------+--------+--+--------+--+---------+ Left Carotid Findings: +----------+--------+--------+--------+------------------+-------------------+           PSV cm/sEDV cm/sStenosisPlaque DescriptionComments            +----------+--------+--------+--------+------------------+-------------------+ CCA Prox  95      25                                intimal thickening  +----------+--------+--------+--------+------------------+-------------------+ CCA Mid   76      16                                intimal thickening  +----------+--------+--------+--------+------------------+-------------------+ CCA Distal76      18                                intimal thickening  +----------+--------+--------+--------+------------------+-------------------+ ICA Prox  111     39              calcific          ICA/CCA ratio = 1.5 +----------+--------+--------+--------+------------------+-------------------+ ICA Mid   107     25                                                    +----------+--------+--------+--------+------------------+-------------------+ ICA Distal105     25                                                     +----------+--------+--------+--------+------------------+-------------------+ ECA       134     19  intimal thickening  +----------+--------+--------+--------+------------------+-------------------+ +----------+--------+--------+-----------+-------------------+           PSV cm/sEDV cm/sDescribe   Arm Pressure (mmHG) +----------+--------+--------+-----------+-------------------+ [id number]             Multiphasic                    +----------+--------+--------+-----------+-------------------+ +---------+--------+--+--------+--+---------+ VertebralPSV cm/s65EDV cm/s12Antegrade +---------+--------+--+--------+--+---------+ Summary: Right Carotid: The ECA appears <50% stenosed. Patent carotid endarterectomy and                ICA stent with no evidence of restensis. Left Carotid: Velocities in the left ICA are consistent with a 1-39% stenosis.               The ECA appears <50% stenosed. Vertebrals:  Bilateral vertebral arteries demonstrate antegrade flow. Subclavians: Normal flow hemodynamics were seen in bilateral subclavian              arteries. *See table(s) above for measurements and observations.  Electronically signed by Festus BarrenJason Nagee Goates MD on 04/20/2020 at 2:30:40 PM.    Final      Assessment/Plan Essential hypertension blood pressure control important in reducing the progression of atherosclerotic disease. On appropriate oral medications.   Hyperlipidemia lipid control important in reducing the progression of atherosclerotic disease. Continue statin therapy  Atherosclerosis of native arteries of extremity with intermittent claudication (HCC) Her noninvasive studies do show a drop in her ABIs bilaterally.  On the right, she is 0.56 and on the left she is 0.74.  She has reduced digital pressures bilaterally.  Previously, her ABIs were 0.79 on the right and 1.2 on the left.  She still is not severely symptomatic and  does not want to have any intervention unless she absolutely has to.  I think we are okay at this point.  If she develops limb threatening symptoms, that would obviously change things.  Carotid stenosis Carotid duplex today shows a right carotid stent to be widely patent and the left carotid to remain in the 1 to 39% range.  No significant progression from her previous study.  Continue current medical regimen.  She is coming back to check her ABIs which were dropped in 6 months, and due to an early recurrence of things would be reasonable to continue to follow her carotids on 6880-month intervals at this point.    Festus BarrenJason Enoch Moffa, MD  04/21/2020 8:41 AM    This note was created with Dragon medical transcription system.  Any errors from dictation are purely unintentional

## 2020-04-21 NOTE — Patient Instructions (Signed)
Peripheral Vascular Disease  Peripheral vascular disease (PVD) is a disease of the blood vessels that are not part of your heart and brain. A simple term for PVD is poor circulation. In most cases, PVD narrows the blood vessels that carry blood from your heart to the rest of your body. This can reduce the supply of blood to your arms, legs, and internal organs, like your stomach or kidneys. However, PVD most often affects a person's lower legs and feet. Without treatment, PVD tends to get worse. PVD can also lead to acute ischemic limb. This is when an arm or leg suddenly cannot get enough blood. This is a medical emergency. Follow these instructions at home: Lifestyle  Do not use any products that contain nicotine or tobacco, such as cigarettes and e-cigarettes. If you need help quitting, ask your doctor.  Lose weight if you are overweight. Or, stay at a healthy weight as told by your doctor.  Eat a diet that is low in fat and cholesterol. If you need help, ask your doctor.  Exercise regularly. Ask your doctor for activities that are right for you. General instructions  Take over-the-counter and prescription medicines only as told by your doctor.  Take good care of your feet: ? Wear comfortable shoes that fit well. ? Check your feet often for any cuts or sores.  Keep all follow-up visits as told by your doctor This is important. Contact a doctor if:  You have cramps in your legs when you walk.  You have leg pain when you are at rest.  You have coldness in a leg or foot.  Your skin changes.  You are unable to get or have an erection (erectile dysfunction).  You have cuts or sores on your feet that do not heal. Get help right away if:  Your arm or leg turns cold, numb, and blue.  Your arms or legs become red, warm, swollen, painful, or numb.  You have chest pain.  You have trouble breathing.  You suddenly have weakness in your face, arm, or leg.  You become very  confused or you cannot speak.  You suddenly have a very bad headache.  You suddenly cannot see. Summary  Peripheral vascular disease (PVD) is a disease of the blood vessels.  A simple term for PVD is poor circulation. Without treatment, PVD tends to get worse.  Treatment may include exercise, low fat and low cholesterol diet, and quitting smoking. This information is not intended to replace advice given to you by your health care provider. Make sure you discuss any questions you have with your health care provider. Document Revised: 06/01/2017 Document Reviewed: 07/27/2016 Elsevier Patient Education  2020 [employer]  

## 2020-04-21 NOTE — Assessment & Plan Note (Signed)
Carotid duplex today shows a right carotid stent to be widely patent and the left carotid to remain in the 1 to 39% range.  No significant progression from her previous study.  Continue current medical regimen.  She is coming back to check her ABIs which were dropped in 6 months, and due to an early recurrence of things would be reasonable to continue to follow her carotids on 62-month intervals at this point.

## 2020-08-31 DIAGNOSIS — R7301 Impaired fasting glucose: Secondary | ICD-10-CM | POA: Diagnosis not present

## 2020-08-31 DIAGNOSIS — E785 Hyperlipidemia, unspecified: Secondary | ICD-10-CM | POA: Diagnosis not present

## 2020-08-31 DIAGNOSIS — E749 Disorder of carbohydrate metabolism, unspecified: Secondary | ICD-10-CM | POA: Diagnosis not present

## 2020-09-03 DIAGNOSIS — I2510 Atherosclerotic heart disease of native coronary artery without angina pectoris: Secondary | ICD-10-CM | POA: Diagnosis not present

## 2020-09-03 DIAGNOSIS — E749 Disorder of carbohydrate metabolism, unspecified: Secondary | ICD-10-CM | POA: Diagnosis not present

## 2020-09-03 DIAGNOSIS — R69 Illness, unspecified: Secondary | ICD-10-CM | POA: Diagnosis not present

## 2020-09-03 DIAGNOSIS — J329 Chronic sinusitis, unspecified: Secondary | ICD-10-CM | POA: Diagnosis not present

## 2020-09-03 DIAGNOSIS — I10 Essential (primary) hypertension: Secondary | ICD-10-CM | POA: Diagnosis not present

## 2020-09-03 DIAGNOSIS — M13869 Other specified arthritis, unspecified knee: Secondary | ICD-10-CM | POA: Diagnosis not present

## 2020-09-03 DIAGNOSIS — F418 Other specified anxiety disorders: Secondary | ICD-10-CM | POA: Diagnosis not present

## 2020-09-03 DIAGNOSIS — E785 Hyperlipidemia, unspecified: Secondary | ICD-10-CM | POA: Diagnosis not present

## 2020-09-03 DIAGNOSIS — I7389 Other specified peripheral vascular diseases: Secondary | ICD-10-CM | POA: Diagnosis not present

## 2020-09-03 DIAGNOSIS — G2581 Restless legs syndrome: Secondary | ICD-10-CM | POA: Diagnosis not present

## 2020-09-03 DIAGNOSIS — G4709 Other insomnia: Secondary | ICD-10-CM | POA: Diagnosis not present

## 2020-09-06 ENCOUNTER — Other Ambulatory Visit (INDEPENDENT_AMBULATORY_CARE_PROVIDER_SITE_OTHER): Payer: Self-pay | Admitting: Vascular Surgery

## 2020-09-13 ENCOUNTER — Inpatient Hospital Stay: Payer: Medicare HMO | Attending: Oncology

## 2020-09-13 DIAGNOSIS — E876 Hypokalemia: Secondary | ICD-10-CM | POA: Insufficient documentation

## 2020-09-13 DIAGNOSIS — Z87891 Personal history of nicotine dependence: Secondary | ICD-10-CM | POA: Diagnosis not present

## 2020-09-13 DIAGNOSIS — D751 Secondary polycythemia: Secondary | ICD-10-CM | POA: Diagnosis not present

## 2020-09-13 LAB — CBC WITH DIFFERENTIAL/PLATELET
Abs Immature Granulocytes: 0.01 K/uL (ref 0.00–0.07)
Basophils Absolute: 0.1 K/uL (ref 0.0–0.1)
Basophils Relative: 1 %
Eosinophils Absolute: 0.2 K/uL (ref 0.0–0.5)
Eosinophils Relative: 5 %
HCT: 42.9 % (ref 36.0–46.0)
Hemoglobin: 14.8 g/dL (ref 12.0–15.0)
Immature Granulocytes: 0 %
Lymphocytes Relative: 26 %
Lymphs Abs: 1.1 K/uL (ref 0.7–4.0)
MCH: 32.2 pg (ref 26.0–34.0)
MCHC: 34.5 g/dL (ref 30.0–36.0)
MCV: 93.3 fL (ref 80.0–100.0)
Monocytes Absolute: 0.7 K/uL (ref 0.1–1.0)
Monocytes Relative: 16 %
Neutro Abs: 2.2 K/uL (ref 1.7–7.7)
Neutrophils Relative %: 52 %
Platelets: 214 K/uL (ref 150–400)
RBC: 4.6 MIL/uL (ref 3.87–5.11)
RDW: 13.5 % (ref 11.5–15.5)
WBC: 4.2 K/uL (ref 4.0–10.5)
nRBC: 0 % (ref 0.0–0.2)

## 2020-09-13 LAB — COMPREHENSIVE METABOLIC PANEL
ALT: 61 U/L — ABNORMAL HIGH (ref 0–44)
AST: 55 U/L — ABNORMAL HIGH (ref 15–41)
Albumin: 4.5 g/dL (ref 3.5–5.0)
Alkaline Phosphatase: 73 U/L (ref 38–126)
Anion gap: 9 (ref 5–15)
BUN: 10 mg/dL (ref 8–23)
CO2: 31 mmol/L (ref 22–32)
Calcium: 9.3 mg/dL (ref 8.9–10.3)
Chloride: 98 mmol/L (ref 98–111)
Creatinine, Ser: 0.65 mg/dL (ref 0.44–1.00)
GFR, Estimated: >60 mL/min (ref >60)
Glucose, Bld: 70 mg/dL (ref 70–99)
Potassium: 3.2 mmol/L — ABNORMAL LOW (ref 3.5–5.1)
Sodium: 138 mmol/L (ref 135–145)
Total Bilirubin: 0.7 mg/dL (ref 0.3–1.2)
Total Protein: 7.6 g/dL (ref 6.5–8.1)

## 2020-09-14 ENCOUNTER — Inpatient Hospital Stay (HOSPITAL_BASED_OUTPATIENT_CLINIC_OR_DEPARTMENT_OTHER): Payer: Medicare HMO | Admitting: Oncology

## 2020-09-14 ENCOUNTER — Encounter: Payer: Self-pay | Admitting: Oncology

## 2020-09-14 DIAGNOSIS — E876 Hypokalemia: Secondary | ICD-10-CM

## 2020-09-14 DIAGNOSIS — Z87891 Personal history of nicotine dependence: Secondary | ICD-10-CM | POA: Diagnosis not present

## 2020-09-14 DIAGNOSIS — D751 Secondary polycythemia: Secondary | ICD-10-CM | POA: Diagnosis not present

## 2020-09-14 MED ORDER — POTASSIUM CHLORIDE CRYS ER 20 MEQ PO TBCR
20.0000 meq | EXTENDED_RELEASE_TABLET | Freq: Every day | ORAL | 0 refills | Status: DC
Start: 2020-09-14 — End: 2021-08-13

## 2020-09-14 NOTE — Progress Notes (Signed)
Patient contacted for Mychart visit. No new concerns voiced.  

## 2020-09-14 NOTE — Progress Notes (Signed)
HEMATOLOGY-ONCOLOGY TeleHEALTH VISIT PROGRESS NOTE  I connected with Bethany Lozano on 09/14/20  at  2:45 PM EDT by video enabled telemedicine visit and verified that I am speaking with the correct person using two identifiers. I discussed the limitations, risks, security and privacy concerns of performing an evaluation and management service by telemedicine and the availability of in-person appointments. The patient expressed understanding and agreed to proceed.   Other persons participating in the visit and their role in the encounter:  None  Patient's location: Home  Provider's location: office Chief Complaint: Erythrocytosis   INTERVAL HISTORY Bethany Lozano is a 71 y.o. female who has above history reviewed by me today presents for follow up visit for secondary erythrocytosis Problems and complaints are listed below:  Patient has no new complaints.  Review of Systems  Constitutional: Negative for appetite change, chills, fatigue and fever.  HENT:   Negative for hearing loss and voice change.   Eyes: Negative for eye problems.  Respiratory: Negative for chest tightness and cough.   Cardiovascular: Negative for chest pain.  Gastrointestinal: Negative for abdominal distention, abdominal pain and blood in stool.  Endocrine: Negative for hot flashes.  Genitourinary: Negative for difficulty urinating and frequency.   Musculoskeletal: Negative for arthralgias.  Skin: Negative for itching and rash.  Neurological: Negative for extremity weakness.  Hematological: Negative for adenopathy.  Psychiatric/Behavioral: Negative for confusion.    Past Medical History:  Diagnosis Date  . Anxiety   . Depression   . Heart murmur   . Hemorrhoids   . Hyperlipidemia   . Peripheral vascular disease (HCC)   . Sinusitis   . Tobacco use    Past Surgical History:  Procedure Laterality Date  . ABDOMINAL HYSTERECTOMY  1991  . APPENDECTOMY    . BICEPT TENODESIS Left 10/08/2017   Procedure: BICEPS  TENODESIS;  Surgeon: Signa Kell, MD;  Location: [facility];  Service: Orthopedics;  Laterality: Left;  . CARDIAC CATHETERIZATION N/A 11/08/2015   Procedure: Left Heart Cath and Coronary Angiography;  Surgeon: Laurier Nancy, MD;  Location: [facility];  Service: Cardiovascular;  Laterality: N/A;  . CARDIAC CATHETERIZATION     Mynx placed in right artery after heart catherization  . CAROTID STENT INSERTION Right 05/15/2016  . COLONOSCOPY  2014  . ENDARTERECTOMY Right 11/25/2015   Procedure: ENDARTERECTOMY CAROTID;  Surgeon: Annice Needy, MD;  Location: [facility];  Service: Vascular;  Laterality: Right;  . FISSURECTOMY  10/22/14  . HEMORRHOIDECTOMY WITH HEMORRHOID BANDING  1991,06/20/14, 10/22/14  . PERIPHERAL VASCULAR CATHETERIZATION Right 05/15/2016   Procedure: Carotid PTA/Stent Intervention;  Surgeon: Annice Needy, MD;  Location: [facility];  Service: Cardiovascular;  Laterality: Right;  . RESECTION DISTAL CLAVICAL  10/08/2017   Procedure: RESECTION DISTAL CLAVICAL;  Surgeon: Signa Kell, MD;  Location: [facility];  Service: Orthopedics;;  . SHOULDER ARTHROSCOPY WITH SUBACROMIAL DECOMPRESSION Left 10/08/2017   Procedure: SHOULDER ARTHROSCOPY WITH SUBACROMIAL DECOMPRESSION;  Surgeon: Signa Kell, MD;  Location: [facility];  Service: Orthopedics;  Laterality: Left;  . SHOULDER OPEN ROTATOR CUFF REPAIR Left 10/08/2017   Procedure: ROTATOR CUFF REPAIR SHOULDER OPEN Extensive Glenoid Humeral debridement;  Surgeon: Signa Kell, MD;  Location: [facility];  Service: Orthopedics;  Laterality: Left;  . TUBAL LIGATION  1977    Family History  Problem Relation Age of Onset  . Hypertension Mother   . Osteoporosis Mother   . Heart attack Father   . Diabetes Brother   . Breast cancer Neg  Hx     Social History   Socioeconomic History  . Marital status: Single    Spouse name: Not on file  . Number of children: Not on file  . Years of education: Not on file  . Highest education level: Not on  file  Occupational History  . Occupation: retired  Tobacco Use  . Smoking status: Current Every Day Smoker    Packs/day: 1.00    Years: 45.00    Pack years: 45.00    Types: Cigarettes  . Smokeless tobacco: Never Used  Vaping Use  . Vaping Use: Never used  Substance and Sexual Activity  . Alcohol use: Not Currently    Alcohol/week: 0.0 standard drinks  . Drug use: No  . Sexual activity: Not on file  Other Topics Concern  . Not on file  Social History Narrative  . Not on file   Social Determinants of Health   Financial Resource Strain: Not on file  Food Insecurity: Not on file  Transportation Needs: Not on file  Physical Activity: Not on file  Stress: Not on file  Social Connections: Not on file  Intimate Partner Violence: Not on file    Current Outpatient Medications on File Prior to Visit  Medication Sig Dispense Refill  . aspirin 81 MG EC tablet Take 81 mg by mouth daily.    . chlorthalidone (HYGROTON) 25 MG tablet Take 25 mg by mouth daily.   0  . Cholecalciferol (VITAMIN D3) 5000 units CAPS Take 1,000 Units by mouth daily.     . clonazePAM (KLONOPIN) 0.5 MG tablet Take 0.5 mg by mouth daily.    . clopidogrel (PLAVIX) 75 MG tablet TAKE 1 TABLET BY MOUTH EVERY DAY 90 tablet 3  . DULoxetine (CYMBALTA) 60 MG capsule Take 60 mg by mouth daily.    Marland Kitchen estradiol (ESTRACE) 0.5 MG tablet Take 0.5 mg by mouth every morning.  0  . ezetimibe (ZETIA) 10 MG tablet Take 10 mg by mouth daily.    . fluticasone (FLONASE) 50 MCG/ACT nasal spray Place 1 spray into both nostrils daily as needed for allergies or rhinitis.   0  . Melatonin 10 MG TABS Take 10 mg by mouth at bedtime.    Marland Kitchen REPATHA SURECLICK 140 MG/ML SOAJ Inject into the skin.    . rosuvastatin (CRESTOR) 20 MG tablet Take 20 mg by mouth daily.     No current facility-administered medications on file prior to visit.    Allergies  Allergen Reactions  . Codeine Nausea Only  . Latex     Other reaction(s): Other (See Comments)   . Adhesive [Tape] Itching and Rash  . Penicillins Rash    Tolerates ampicillin  Has patient had a PCN reaction causing immediate rash, facial/tongue/throat swelling, SOB or lightheadedness with hypotension: no Has patient had a PCN reaction causing severe rash involving mucus membranes or skin necrosis: no Has patient had a PCN reaction that required hospitalization {no Has patient had a PCN reaction occurring within the last 10 years: no If all of the above answers are "NO", then may proceed with Cephalosporin use.       Observations/Objective: There were no vitals filed for this visit. There is no height or weight on file to calculate BMI.  Physical Exam Neurological:     Mental Status: She is alert.     CBC    Component Value Date/Time   WBC 4.2 09/13/2020 1136   RBC 4.60 09/13/2020 1136   HGB 14.8 09/13/2020  1136   HCT 42.9 09/13/2020 1136   PLT 214 09/13/2020 1136   MCV 93.3 09/13/2020 1136   MCH 32.2 09/13/2020 1136   MCHC 34.5 09/13/2020 1136   RDW 13.5 09/13/2020 1136   LYMPHSABS 1.1 09/13/2020 1136   MONOABS 0.7 09/13/2020 1136   EOSABS 0.2 09/13/2020 1136   BASOSABS 0.1 09/13/2020 1136    CMP     Component Value Date/Time   NA 138 09/13/2020 1136   K 3.2 (L) 09/13/2020 1136   CL 98 09/13/2020 1136   CO2 31 09/13/2020 1136   GLUCOSE 70 09/13/2020 1136   BUN 10 09/13/2020 1136   CREATININE 0.65 09/13/2020 1136   CALCIUM 9.3 09/13/2020 1136   PROT 7.6 09/13/2020 1136   ALBUMIN 4.5 09/13/2020 1136   AST 55 (H) 09/13/2020 1136   ALT 61 (H) 09/13/2020 1136   ALKPHOS 73 09/13/2020 1136   BILITOT 0.7 09/13/2020 1136   GFRNONAA >60 09/13/2020 1136   GFRAA >60 03/15/2020 1241     Assessment and Plan: 1. Secondary erythrocytosis   2. Personal history of tobacco use, presenting hazards to health   3. Hypokalemia     Secondary erythrocytosis, Hemoglobin has improved to 14.8.  No need for phlebotomy. Smoke cessation.  Hypokalemia, this seems to be  chronic for her since 6 months ago.  Sent patient course of 10 days potassium chloride 20 mEq daily.  Advised patient to follow-up with primary care provider in 1-2 repeat potassium level.  She agrees.  Follow Up Instructions:  1 year  I discussed the assessment and treatment plan with the patient. The patient was provided an opportunity to ask questions and all were answered. The patient agreed with the plan and demonstrated an understanding of the instructions.  The patient was advised to call back or seek an in-person evaluation if the symptoms worsen or if the condition fails to improve as anticipated.   Rickard Patience, MD 09/14/2020 8:09 PM

## 2020-10-19 ENCOUNTER — Encounter (INDEPENDENT_AMBULATORY_CARE_PROVIDER_SITE_OTHER): Payer: Self-pay | Admitting: Vascular Surgery

## 2020-10-19 ENCOUNTER — Ambulatory Visit (INDEPENDENT_AMBULATORY_CARE_PROVIDER_SITE_OTHER): Payer: Medicare HMO

## 2020-10-19 ENCOUNTER — Ambulatory Visit (INDEPENDENT_AMBULATORY_CARE_PROVIDER_SITE_OTHER): Payer: Medicare HMO | Admitting: Vascular Surgery

## 2020-10-19 ENCOUNTER — Other Ambulatory Visit: Payer: Self-pay

## 2020-10-19 VITALS — BP 150/72 | HR 76 | Resp 17 | Ht 65.0 in | Wt 147.0 lb

## 2020-10-19 DIAGNOSIS — I70211 Atherosclerosis of native arteries of extremities with intermittent claudication, right leg: Secondary | ICD-10-CM

## 2020-10-19 DIAGNOSIS — Z720 Tobacco use: Secondary | ICD-10-CM

## 2020-10-19 DIAGNOSIS — I6523 Occlusion and stenosis of bilateral carotid arteries: Secondary | ICD-10-CM | POA: Diagnosis not present

## 2020-10-19 DIAGNOSIS — I10 Essential (primary) hypertension: Secondary | ICD-10-CM | POA: Diagnosis not present

## 2020-10-19 DIAGNOSIS — E785 Hyperlipidemia, unspecified: Secondary | ICD-10-CM

## 2020-10-19 NOTE — Assessment & Plan Note (Signed)
No interest in quitting.  Understands this is part of the cause of her vascular disease.

## 2020-10-19 NOTE — Assessment & Plan Note (Signed)
Duplex today shows a widely patent right carotid stent in 1 to 39% left ICA stenosis.  Overall doing well.  We will continue to follow this on 34-month intervals for now as she had an early recurrence after her carotid endarterectomy.  Continue current medical regimen.

## 2020-10-19 NOTE — Assessment & Plan Note (Signed)
ABIs today are improved to 0.68 on the right 0.93 on the left.  Continues to have claudication symptoms but this is stable and not worsening.  No critical limb symptoms.  Recheck in 6 months.  Continue current medical regimen.

## 2020-10-19 NOTE — Progress Notes (Signed)
MRN : [id number]  Bethany Lozano is a 70 y.o. ([birth year]) female who presents with chief complaint of  Chief Complaint  Patient presents with  . Follow-up    ultrasound  .  History of Present Illness: Patient returns in follow-up of multiple vascular issues.  She is doing well today.  She continues to have moderate claudication symptoms but overall these are stable to improved.  No rest pain or ulceration. ABIs today are improved to 0.68 on the right 0.93 on the left. She is also followed for carotid disease.  She has undergone right carotid endarterectomy and a subsequent right carotid stent for recurrent stenosis.  No focal neurologic symptoms.  Duplex today shows a widely patent right carotid stent in 1 to 39% left ICA stenosis.  Current Outpatient Medications  Medication Sig Dispense Refill  . aspirin 81 MG EC tablet Take 81 mg by mouth daily.    . chlorthalidone (HYGROTON) 25 MG tablet Take 25 mg by mouth daily.   0  . Cholecalciferol (VITAMIN D3) 5000 units CAPS Take 1,000 Units by mouth daily.     . clonazePAM (KLONOPIN) 0.5 MG tablet Take 0.5 mg by mouth daily.    . clopidogrel (PLAVIX) 75 MG tablet TAKE 1 TABLET BY MOUTH EVERY DAY 90 tablet 3  . DULoxetine (CYMBALTA) 60 MG capsule Take 60 mg by mouth daily.    Marland Kitchen estradiol (ESTRACE) 0.5 MG tablet Take 0.5 mg by mouth every morning.  0  . ezetimibe (ZETIA) 10 MG tablet Take 10 mg by mouth daily.    . fluticasone (FLONASE) 50 MCG/ACT nasal spray Place 1 spray into both nostrils daily as needed for allergies or rhinitis.   0  . Melatonin 10 MG TABS Take 10 mg by mouth at bedtime.    . potassium chloride SA (KLOR-CON) 20 MEQ tablet Take 1 tablet (20 mEq total) by mouth daily. 10 tablet 0  . REPATHA SURECLICK 140 MG/ML SOAJ Inject into the skin.    . rosuvastatin (CRESTOR) 20 MG tablet Take 20 mg by mouth daily.     No current facility-administered medications for this visit.    Past Medical History:  Diagnosis Date  .  Anxiety   . Depression   . Heart murmur   . Hemorrhoids   . Hyperlipidemia   . Peripheral vascular disease (HCC)   . Sinusitis   . Tobacco use     Past Surgical History:  Procedure Laterality Date  . ABDOMINAL HYSTERECTOMY  1991  . APPENDECTOMY    . BICEPT TENODESIS Left 10/08/2017   Procedure: BICEPS TENODESIS;  Surgeon: Signa Kell, MD;  Location: [facility];  Service: Orthopedics;  Laterality: Left;  . CARDIAC CATHETERIZATION N/A 11/08/2015   Procedure: Left Heart Cath and Coronary Angiography;  Surgeon: Laurier Nancy, MD;  Location: [facility];  Service: Cardiovascular;  Laterality: N/A;  . CARDIAC CATHETERIZATION     Mynx placed in right artery after heart catherization  . CAROTID STENT INSERTION Right 05/15/2016  . COLONOSCOPY  2014  . ENDARTERECTOMY Right 11/25/2015   Procedure: ENDARTERECTOMY CAROTID;  Surgeon: Annice Needy, MD;  Location: [facility];  Service: Vascular;  Laterality: Right;  . FISSURECTOMY  10/22/14  . HEMORRHOIDECTOMY WITH HEMORRHOID BANDING  1991,06/20/14, 10/22/14  . PERIPHERAL VASCULAR CATHETERIZATION Right 05/15/2016   Procedure: Carotid PTA/Stent Intervention;  Surgeon: Annice Needy, MD;  Location: [facility];  Service: Cardiovascular;  Laterality: Right;  . RESECTION DISTAL CLAVICAL  10/08/2017  Procedure: RESECTION DISTAL CLAVICAL;  Surgeon: Signa Kell, MD;  Location: [facility];  Service: Orthopedics;;  . SHOULDER ARTHROSCOPY WITH SUBACROMIAL DECOMPRESSION Left 10/08/2017   Procedure: SHOULDER ARTHROSCOPY WITH SUBACROMIAL DECOMPRESSION;  Surgeon: Signa Kell, MD;  Location: [facility];  Service: Orthopedics;  Laterality: Left;  . SHOULDER OPEN ROTATOR CUFF REPAIR Left 10/08/2017   Procedure: ROTATOR CUFF REPAIR SHOULDER OPEN Extensive Glenoid Humeral debridement;  Surgeon: Signa Kell, MD;  Location: [facility];  Service: Orthopedics;  Laterality: Left;  . TUBAL LIGATION  1977     Social History   Tobacco Use  . Smoking status: Current  Every Day Smoker    Packs/day: 1.00    Years: 45.00    Pack years: 45.00    Types: Cigarettes  . Smokeless tobacco: Never Used  Vaping Use  . Vaping Use: Never used  Substance Use Topics  . Alcohol use: Not Currently    Alcohol/week: 0.0 standard drinks  . Drug use: No      Family History  Problem Relation Age of Onset  . Hypertension Mother   . Osteoporosis Mother   . Heart attack Father   . Diabetes Brother   . Breast cancer Neg Hx     Allergies  Allergen Reactions  . Codeine Nausea Only  . Latex     Other reaction(s): Other (See Comments)  . Adhesive [Tape] Itching and Rash  . Penicillins Rash    Tolerates ampicillin  Has patient had a PCN reaction causing immediate rash, facial/tongue/throat swelling, SOB or lightheadedness with hypotension: no Has patient had a PCN reaction causing severe rash involving mucus membranes or skin necrosis: no Has patient had a PCN reaction that required hospitalization {no Has patient had a PCN reaction occurring within the last 10 years: no If all of the above answers are "NO", then may proceed with Cephalosporin use.    REVIEW OF SYSTEMS(Negative unless checked)  Constitutional: [] ?????Weight loss[] ?????Fever[] ?????Chills Cardiac:[] ?????Chest pain[] ?????Chest pressure[] ?????Palpitations [] ?????Shortness of breath when laying flat [] ?????Shortness of breath at rest [] ?????Shortness of breath with exertion. Vascular: [x] ?????Pain in legs with walking[] ?????Pain in legsat rest[] ?????Pain in legs when laying flat [x] ?????Claudication [] ?????Pain in feet when walking [] ?????Pain in feet at rest [] ?????Pain in feet when laying flat [] ?????History of DVT [] ?????Phlebitis [] ?????Swelling in legs [] ?????Varicose veins [] ?????Non-healing ulcers Pulmonary: [] ?????Uses home oxygen [] ?????Productive cough[] ?????Hemoptysis [] ?????Wheeze [] ?????COPD [] ?????Asthma Neurologic: [x] ?????Dizziness  [] ?????Blackouts [] ?????Seizures [] ?????History of stroke [] ?????History of TIA[] ?????Aphasia [x] ?????Temporary blindness[] ?????Dysphagia [] ?????Weaknessor numbness in arms [x] ?????Weakness or numbnessin legs Musculoskeletal: [] ?????Arthritis [] ?????Joint swelling [] ?????Joint pain [] ?????Low back pain Hematologic:[] ?????Easy bruising[] ?????Easy bleeding [] ?????Hypercoagulable state [] ?????Anemic [] ?????Hepatitis Gastrointestinal:[] ?????Blood in stool[] ?????Vomiting blood[] ?????Gastroesophageal reflux/heartburn[] ?????Difficulty swallowing. Genitourinary: [] ?????Chronic kidney disease [] ?????Difficulturination [] ?????Frequenturination [] ?????Burning with urination[] ?????Blood in urine Skin: [] ?????Rashes [] ?????Ulcers [] ?????Wounds Psychological: [x] ?????History of anxiety[] ?????History of major depression  Physical Examination  Vitals:   10/19/20 1429  BP: (!) 150/72  Pulse: 76  Resp: 17  Weight: 147 lb (66.7 kg)  Height: 5\' 5"  (1.651 m)   Body mass index is 24.46 kg/m. Gen:  WD/WN, NAD Head: [place]/AT, No temporalis wasting. Ear/Nose/Throat: Hearing grossly intact, nares w/o erythema or drainage, trachea midline Eyes: Conjunctiva clear. Sclera non-icteric Neck: Supple.  Trachea midline Pulmonary:  Good air movement, equal and clear to auscultation bilaterally.  Cardiac: RRR, No JVD Vascular:  Vessel Right Left  Radial Palpable Palpable                          PT  1+ palpable  1+ palpable  DP  1+ palpable Palpable  Gastrointestinal: soft, non-tender/non-distended. No guarding/reflex.  Musculoskeletal: M/S 5/5 throughout.  No deformity or atrophy.  Trace lower extremity edema. Neurologic: CN 2-12 intact. Sensation grossly intact in extremities.  Symmetrical.  Speech is fluent. Motor exam as listed above. Psychiatric: Judgment intact, Mood & affect appropriate for pt's clinical situation. Dermatologic: No rashes  or ulcers noted.  No cellulitis or open wounds.      CBC Lab Results  Component Value Date   WBC 4.2 09/13/2020   HGB 14.8 09/13/2020   HCT 42.9 09/13/2020   MCV 93.3 09/13/2020   PLT 214 09/13/2020    BMET    Component Value Date/Time   NA 138 09/13/2020 1136   K 3.2 (L) 09/13/2020 1136   CL 98 09/13/2020 1136   CO2 31 09/13/2020 1136   GLUCOSE 70 09/13/2020 1136   BUN 10 09/13/2020 1136   CREATININE 0.65 09/13/2020 1136   CALCIUM 9.3 09/13/2020 1136   GFRNONAA >60 09/13/2020 1136   GFRAA >60 03/15/2020 1241   CrCl cannot be calculated (Patient's most recent lab result is older than the maximum 21 days allowed.).  COAG Lab Results  Component Value Date   INR 0.95 11/17/2015    Radiology No results found.   Assessment/Plan Essential hypertension blood pressure control important in reducing the progression of atherosclerotic disease. On appropriate oral medications.   Hyperlipidemia lipid control important in reducing the progression of atherosclerotic disease. Continue statin therapy  Tobacco use No interest in quitting.  Understands this is part of the cause of her vascular disease.  Atherosclerosis of native arteries of extremity with intermittent claudication (HCC) ABIs today are improved to 0.68 on the right 0.93 on the left.  Continues to have claudication symptoms but this is stable and not worsening.  No critical limb symptoms.  Recheck in 6 months.  Continue current medical regimen.  Carotid stenosis Duplex today shows a widely patent right carotid stent in 1 to 39% left ICA stenosis.  Overall doing well.  We will continue to follow this on 92-month intervals for now as she had an early recurrence after her carotid endarterectomy.  Continue current medical regimen.    Festus Barren, MD  10/19/2020 3:18 PM    This note was created with Dragon medical transcription system.  Any errors from dictation are purely unintentional

## 2020-11-16 ENCOUNTER — Other Ambulatory Visit: Payer: Self-pay | Admitting: Internal Medicine

## 2020-11-16 DIAGNOSIS — Z1231 Encounter for screening mammogram for malignant neoplasm of breast: Secondary | ICD-10-CM

## 2020-11-18 ENCOUNTER — Ambulatory Visit
Admission: RE | Admit: 2020-11-18 | Discharge: 2020-11-18 | Disposition: A | Discharge status: Home / Self Care | Payer: Medicare HMO | Source: Ambulatory Visit | Attending: Internal Medicine | Admitting: Internal Medicine

## 2020-11-18 ENCOUNTER — Other Ambulatory Visit: Payer: Self-pay

## 2020-11-18 DIAGNOSIS — Z1231 Encounter for screening mammogram for malignant neoplasm of breast: Secondary | ICD-10-CM | POA: Insufficient documentation

## 2020-12-15 ENCOUNTER — Telehealth: Payer: Self-pay

## 2020-12-15 NOTE — Telephone Encounter (Signed)
Patient is scheduled for annual lung screening CT scan on Friday June 24 @ 2:00. Patients insurance is still the same and smokes about a pack per day. She remembers location of imaging center.

## 2020-12-16 ENCOUNTER — Other Ambulatory Visit: Payer: Self-pay | Admitting: *Deleted

## 2020-12-16 DIAGNOSIS — Z122 Encounter for screening for malignant neoplasm of respiratory organs: Secondary | ICD-10-CM

## 2020-12-16 DIAGNOSIS — Z87891 Personal history of nicotine dependence: Secondary | ICD-10-CM

## 2020-12-16 DIAGNOSIS — F17200 Nicotine dependence, unspecified, uncomplicated: Secondary | ICD-10-CM

## 2020-12-16 NOTE — Progress Notes (Signed)
Contacted and scheduled for annual lung screening scan. Patient is a current smoker with a 47 pack year history.  

## 2020-12-24 ENCOUNTER — Other Ambulatory Visit: Payer: Self-pay

## 2020-12-24 ENCOUNTER — Ambulatory Visit
Admission: RE | Admit: 2020-12-24 | Discharge: 2020-12-24 | Disposition: A | Discharge status: Home / Self Care | Payer: Medicare HMO | Source: Ambulatory Visit | Attending: Oncology | Admitting: Oncology

## 2020-12-24 DIAGNOSIS — Z87891 Personal history of nicotine dependence: Secondary | ICD-10-CM | POA: Diagnosis not present

## 2020-12-24 DIAGNOSIS — F17200 Nicotine dependence, unspecified, uncomplicated: Secondary | ICD-10-CM | POA: Insufficient documentation

## 2020-12-24 DIAGNOSIS — Z122 Encounter for screening for malignant neoplasm of respiratory organs: Secondary | ICD-10-CM | POA: Diagnosis not present

## 2020-12-24 DIAGNOSIS — R69 Illness, unspecified: Secondary | ICD-10-CM | POA: Diagnosis not present

## 2021-01-05 DIAGNOSIS — E785 Hyperlipidemia, unspecified: Secondary | ICD-10-CM | POA: Diagnosis not present

## 2021-01-05 DIAGNOSIS — E749 Disorder of carbohydrate metabolism, unspecified: Secondary | ICD-10-CM | POA: Diagnosis not present

## 2021-01-05 DIAGNOSIS — I10 Essential (primary) hypertension: Secondary | ICD-10-CM | POA: Diagnosis not present

## 2021-01-05 DIAGNOSIS — R7301 Impaired fasting glucose: Secondary | ICD-10-CM | POA: Diagnosis not present

## 2021-01-07 DIAGNOSIS — I2510 Atherosclerotic heart disease of native coronary artery without angina pectoris: Secondary | ICD-10-CM | POA: Diagnosis not present

## 2021-01-07 DIAGNOSIS — F17210 Nicotine dependence, cigarettes, uncomplicated: Secondary | ICD-10-CM | POA: Diagnosis not present

## 2021-01-07 DIAGNOSIS — E785 Hyperlipidemia, unspecified: Secondary | ICD-10-CM | POA: Diagnosis not present

## 2021-01-07 DIAGNOSIS — G4709 Other insomnia: Secondary | ICD-10-CM | POA: Diagnosis not present

## 2021-01-07 DIAGNOSIS — I10 Essential (primary) hypertension: Secondary | ICD-10-CM | POA: Diagnosis not present

## 2021-01-07 DIAGNOSIS — Z23 Encounter for immunization: Secondary | ICD-10-CM | POA: Diagnosis not present

## 2021-01-07 DIAGNOSIS — E749 Disorder of carbohydrate metabolism, unspecified: Secondary | ICD-10-CM | POA: Diagnosis not present

## 2021-01-07 DIAGNOSIS — M13869 Other specified arthritis, unspecified knee: Secondary | ICD-10-CM | POA: Diagnosis not present

## 2021-01-07 DIAGNOSIS — Z1331 Encounter for screening for depression: Secondary | ICD-10-CM | POA: Diagnosis not present

## 2021-01-07 DIAGNOSIS — R69 Illness, unspecified: Secondary | ICD-10-CM | POA: Diagnosis not present

## 2021-01-07 DIAGNOSIS — F17200 Nicotine dependence, unspecified, uncomplicated: Secondary | ICD-10-CM | POA: Diagnosis not present

## 2021-01-07 DIAGNOSIS — J329 Chronic sinusitis, unspecified: Secondary | ICD-10-CM | POA: Diagnosis not present

## 2021-01-07 DIAGNOSIS — G2581 Restless legs syndrome: Secondary | ICD-10-CM | POA: Diagnosis not present

## 2021-01-07 DIAGNOSIS — Z0001 Encounter for general adult medical examination with abnormal findings: Secondary | ICD-10-CM | POA: Diagnosis not present

## 2021-01-07 DIAGNOSIS — R0602 Shortness of breath: Secondary | ICD-10-CM | POA: Diagnosis not present

## 2021-01-14 ENCOUNTER — Encounter: Payer: Self-pay | Admitting: *Deleted

## 2021-02-02 DIAGNOSIS — J019 Acute sinusitis, unspecified: Secondary | ICD-10-CM | POA: Diagnosis not present

## 2021-02-02 DIAGNOSIS — Z20822 Contact with and (suspected) exposure to covid-19: Secondary | ICD-10-CM | POA: Diagnosis not present

## 2021-04-08 DIAGNOSIS — E785 Hyperlipidemia, unspecified: Secondary | ICD-10-CM | POA: Diagnosis not present

## 2021-04-12 DIAGNOSIS — I340 Nonrheumatic mitral (valve) insufficiency: Secondary | ICD-10-CM | POA: Diagnosis not present

## 2021-04-12 DIAGNOSIS — H2513 Age-related nuclear cataract, bilateral: Secondary | ICD-10-CM | POA: Diagnosis not present

## 2021-04-12 DIAGNOSIS — J329 Chronic sinusitis, unspecified: Secondary | ICD-10-CM | POA: Diagnosis not present

## 2021-04-12 DIAGNOSIS — E785 Hyperlipidemia, unspecified: Secondary | ICD-10-CM | POA: Diagnosis not present

## 2021-04-12 DIAGNOSIS — I2510 Atherosclerotic heart disease of native coronary artery without angina pectoris: Secondary | ICD-10-CM | POA: Diagnosis not present

## 2021-04-12 DIAGNOSIS — E749 Disorder of carbohydrate metabolism, unspecified: Secondary | ICD-10-CM | POA: Diagnosis not present

## 2021-04-12 DIAGNOSIS — I10 Essential (primary) hypertension: Secondary | ICD-10-CM | POA: Diagnosis not present

## 2021-04-12 DIAGNOSIS — R079 Chest pain, unspecified: Secondary | ICD-10-CM | POA: Diagnosis not present

## 2021-04-12 DIAGNOSIS — N951 Menopausal and female climacteric states: Secondary | ICD-10-CM | POA: Diagnosis not present

## 2021-04-12 DIAGNOSIS — R69 Illness, unspecified: Secondary | ICD-10-CM | POA: Diagnosis not present

## 2021-04-12 DIAGNOSIS — Z23 Encounter for immunization: Secondary | ICD-10-CM | POA: Diagnosis not present

## 2021-04-12 DIAGNOSIS — G4709 Other insomnia: Secondary | ICD-10-CM | POA: Diagnosis not present

## 2021-04-12 DIAGNOSIS — I351 Nonrheumatic aortic (valve) insufficiency: Secondary | ICD-10-CM | POA: Diagnosis not present

## 2021-04-12 DIAGNOSIS — G2581 Restless legs syndrome: Secondary | ICD-10-CM | POA: Diagnosis not present

## 2021-04-20 ENCOUNTER — Encounter (INDEPENDENT_AMBULATORY_CARE_PROVIDER_SITE_OTHER): Payer: Medicare HMO

## 2021-04-20 DIAGNOSIS — I10 Essential (primary) hypertension: Secondary | ICD-10-CM | POA: Diagnosis not present

## 2021-04-22 DIAGNOSIS — R609 Edema, unspecified: Secondary | ICD-10-CM | POA: Diagnosis not present

## 2021-04-22 DIAGNOSIS — I2510 Atherosclerotic heart disease of native coronary artery without angina pectoris: Secondary | ICD-10-CM | POA: Diagnosis not present

## 2021-04-26 ENCOUNTER — Ambulatory Visit (INDEPENDENT_AMBULATORY_CARE_PROVIDER_SITE_OTHER): Payer: Medicare HMO

## 2021-04-26 ENCOUNTER — Other Ambulatory Visit: Payer: Self-pay

## 2021-04-26 ENCOUNTER — Ambulatory Visit (INDEPENDENT_AMBULATORY_CARE_PROVIDER_SITE_OTHER): Payer: Medicare HMO | Admitting: Vascular Surgery

## 2021-04-26 ENCOUNTER — Encounter (INDEPENDENT_AMBULATORY_CARE_PROVIDER_SITE_OTHER): Payer: Self-pay | Admitting: Vascular Surgery

## 2021-04-26 VITALS — BP 149/74 | HR 71 | Resp 16 | Wt 146.0 lb

## 2021-04-26 DIAGNOSIS — I10 Essential (primary) hypertension: Secondary | ICD-10-CM

## 2021-04-26 DIAGNOSIS — I70211 Atherosclerosis of native arteries of extremities with intermittent claudication, right leg: Secondary | ICD-10-CM | POA: Diagnosis not present

## 2021-04-26 DIAGNOSIS — E785 Hyperlipidemia, unspecified: Secondary | ICD-10-CM

## 2021-04-26 DIAGNOSIS — I6523 Occlusion and stenosis of bilateral carotid arteries: Secondary | ICD-10-CM | POA: Diagnosis not present

## 2021-04-26 NOTE — Progress Notes (Signed)
MRN : [id number]  Bethany Lozano is a 70 y.o. ([birth year]) female who presents with chief complaint of  Chief Complaint  Patient presents with   Follow-up    Ultrasound follow up   .  History of Present Illness: Patient returns in follow up of multiple vascular issues.  She is overall doing fairly well.  She continues to have claudication symptoms that are about the same.  No significant progression from previous.  No rest pain or ulceration. ABIs are 0.6 on the right and 0.8 on the left.  Slightly down from previous but symptoms about the same. She also follows up for carotid disease.  She is status post right carotid endarterectomy with a subsequent right carotid artery stent for a fairly early recurrence about 5 years ago.  She has done well.  She continues on Plavix, aspirin, and has been able to decrease her dose of her statin agent as she has been on Repatha and had symptoms of leg pain from her statin.  Her LDL was under excellent control currently.  Carotid duplex today shows a widely patent right carotid artery stent and stable 1 to 39% left ICA stenosis.  Current Outpatient Medications  Medication Sig Dispense Refill   aspirin 81 MG EC tablet Take 81 mg by mouth daily.     chlorthalidone (HYGROTON) 25 MG tablet Take 25 mg by mouth daily.   0   Cholecalciferol (VITAMIN D3) 5000 units CAPS Take 1,000 Units by mouth daily.      clonazePAM (KLONOPIN) 0.5 MG tablet Take 0.5 mg by mouth daily.     clopidogrel (PLAVIX) 75 MG tablet TAKE 1 TABLET BY MOUTH EVERY DAY 90 tablet 3   DULoxetine (CYMBALTA) 60 MG capsule Take 60 mg by mouth daily.     estradiol (ESTRACE) 0.5 MG tablet Take 0.5 mg by mouth every morning.  0   fluticasone (FLONASE) 50 MCG/ACT nasal spray Place 1 spray into both nostrils daily as needed for allergies or rhinitis.   0   Melatonin 10 MG TABS Take 10 mg by mouth at bedtime.     REPATHA SURECLICK 140 MG/ML SOAJ Inject into the skin.     rosuvastatin (CRESTOR) 20 MG  tablet Take 10 mg by mouth daily.     ezetimibe (ZETIA) 10 MG tablet Take 10 mg by mouth daily.     potassium chloride SA (KLOR-CON) 20 MEQ tablet Take 1 tablet (20 mEq total) by mouth daily. 10 tablet 0   No current facility-administered medications for this visit.    Past Medical History:  Diagnosis Date   Anxiety    Depression    Heart murmur    Hemorrhoids    Hyperlipidemia    Peripheral vascular disease (HCC)    Sinusitis    Tobacco use     Past Surgical History:  Procedure Laterality Date   ABDOMINAL HYSTERECTOMY  1991   APPENDECTOMY     BICEPT TENODESIS Left 10/08/2017   Procedure: BICEPS TENODESIS;  Surgeon: Signa Kell, MD;  Location: [facility];  Service: Orthopedics;  Laterality: Left;   CARDIAC CATHETERIZATION N/A 11/08/2015   Procedure: Left Heart Cath and Coronary Angiography;  Surgeon: Laurier Nancy, MD;  Location: [facility];  Service: Cardiovascular;  Laterality: N/A;   CARDIAC CATHETERIZATION     Mynx placed in right artery after heart catherization   CAROTID STENT INSERTION Right 05/15/2016   COLONOSCOPY  2014   ENDARTERECTOMY Right 11/25/2015   Procedure: ENDARTERECTOMY CAROTID;  Surgeon: Annice Needy, MD;  Location: [facility];  Service: Vascular;  Laterality: Right;   FISSURECTOMY  10/22/14   HEMORRHOIDECTOMY WITH HEMORRHOID BANDING  1991,06/20/14, 10/22/14   PERIPHERAL VASCULAR CATHETERIZATION Right 05/15/2016   Procedure: Carotid PTA/Stent Intervention;  Surgeon: Annice Needy, MD;  Location: [facility];  Service: Cardiovascular;  Laterality: Right;   RESECTION DISTAL CLAVICAL  10/08/2017   Procedure: RESECTION DISTAL CLAVICAL;  Surgeon: Signa Kell, MD;  Location: [facility];  Service: Orthopedics;;   SHOULDER ARTHROSCOPY WITH SUBACROMIAL DECOMPRESSION Left 10/08/2017   Procedure: SHOULDER ARTHROSCOPY WITH SUBACROMIAL DECOMPRESSION;  Surgeon: Signa Kell, MD;  Location: [facility];  Service: Orthopedics;  Laterality: Left;   SHOULDER OPEN ROTATOR  CUFF REPAIR Left 10/08/2017   Procedure: ROTATOR CUFF REPAIR SHOULDER OPEN Extensive Glenoid Humeral debridement;  Surgeon: Signa Kell, MD;  Location: [facility];  Service: Orthopedics;  Laterality: Left;   TUBAL LIGATION  1977     Social History   Tobacco Use   Smoking status: Every Day    Packs/day: 1.00    Years: 45.00    Pack years: 45.00    Types: Cigarettes   Smokeless tobacco: Never  Vaping Use   Vaping Use: Never used  Substance Use Topics   Alcohol use: Not Currently    Alcohol/week: 0.0 standard drinks   Drug use: No      Family History  Problem Relation Age of Onset   Hypertension Mother    Osteoporosis Mother    Heart attack Father    Diabetes Brother    Breast cancer Neg Hx      Allergies  Allergen Reactions   Codeine Nausea Only   Latex     Other reaction(s): Other (See Comments)   Adhesive [Tape] Itching and Rash   Penicillins Rash    Tolerates ampicillin  Has patient had a PCN reaction causing immediate rash, facial/tongue/throat swelling, SOB or lightheadedness with hypotension: no Has patient had a PCN reaction causing severe rash involving mucus membranes or skin necrosis: no Has patient had a PCN reaction that required hospitalization {no Has patient had a PCN reaction occurring within the last 10 years: no If all of the above answers are "NO", then may proceed with Cephalosporin use.    REVIEW OF SYSTEMS (Negative unless checked)   Constitutional: [] Weight loss  [] Fever  [] Chills Cardiac: [] Chest pain   [] Chest pressure   [] Palpitations   [] Shortness of breath when laying flat   [] Shortness of breath at rest   [] Shortness of breath with exertion. Vascular:  [x] Pain in legs with walking   [] Pain in legs at rest   [] Pain in legs when laying flat   [x] Claudication   [] Pain in feet when walking  [] Pain in feet at rest  [] Pain in feet when laying flat   [] History of DVT   [] Phlebitis   [] Swelling in legs   [] Varicose veins   [] Non-healing  ulcers Pulmonary:   [] Uses home oxygen   [] Productive cough   [] Hemoptysis   [] Wheeze  [] COPD   [] Asthma Neurologic:  [x] Dizziness  [] Blackouts   [] Seizures   [] History of stroke   [] History of TIA  [] Aphasia   [x] Temporary blindness   [] Dysphagia   [] Weakness or numbness in arms   [x] Weakness or numbness in legs Musculoskeletal:  [] Arthritis   [] Joint swelling   [] Joint pain   [] Low back pain Hematologic:  [] Easy bruising  [] Easy bleeding   [] Hypercoagulable state   [] Anemic  [] Hepatitis Gastrointestinal:  [] Blood  in stool   [] Vomiting blood  [] Gastroesophageal reflux/heartburn   [] Difficulty swallowing. Genitourinary:  [] Chronic kidney disease   [] Difficult urination  [] Frequent urination  [] Burning with urination   [] Blood in urine Skin:  [] Rashes   [] Ulcers   [] Wounds Psychological:  [x] History of anxiety   []  History of major depression  Physical Examination  Vitals:   04/26/21 1410  BP: (!) 149/74  Pulse: 71  Resp: 16  Weight: 146 lb (66.2 kg)   Body mass index is 24.3 kg/m. Gen:  WD/WN, NAD Head: [place]/AT, No temporalis wasting. Ear/Nose/Throat: Hearing grossly intact, nares w/o erythema or drainage, trachea midline Eyes: Conjunctiva clear. Sclera non-icteric Neck: Supple.  Right CEA incision well healed. Pulmonary:  Good air movement, equal and clear to auscultation bilaterally.  Cardiac: RRR, No JVD Vascular:  Vessel Right Left  Radial Palpable Palpable                          PT Trace Palpable 1+ Palpable  DP 1+ Palpable 1+ Palpable    Musculoskeletal: M/S 5/5 throughout.  No deformity or atrophy. Mild LE edema.  Capillary refill slightly sluggish on the right Neurologic: CN 2-12 intact. Sensation grossly intact in extremities.  Symmetrical.  Speech is fluent. Motor exam as listed above. Psychiatric: Judgment intact, Mood & affect appropriate for pt's clinical situation. Dermatologic: No rashes or ulcers noted.  No cellulitis or open wounds.     CBC Lab  Results  Component Value Date   WBC 4.2 09/13/2020   HGB 14.8 09/13/2020   HCT 42.9 09/13/2020   MCV 93.3 09/13/2020   PLT 214 09/13/2020    BMET    Component Value Date/Time   NA 138 09/13/2020 1136   K 3.2 (L) 09/13/2020 1136   CL 98 09/13/2020 1136   CO2 31 09/13/2020 1136   GLUCOSE 70 09/13/2020 1136   BUN 10 09/13/2020 1136   CREATININE 0.65 09/13/2020 1136   CALCIUM 9.3 09/13/2020 1136   GFRNONAA >60 09/13/2020 1136   GFRAA >60 03/15/2020 1241   CrCl cannot be calculated (Patient's most recent lab result is older than the maximum 21 days allowed.).  COAG Lab Results  Component Value Date   INR 0.95 11/17/2015    Radiology No results found.   Assessment/Plan Essential hypertension blood pressure control important in reducing the progression of atherosclerotic disease. On appropriate oral medications.     Hyperlipidemia lipid control important in reducing the progression of atherosclerotic disease. Continue statin therapy  Atherosclerosis of native arteries of extremity with intermittent claudication (HCC) ABIs are 0.6 on the right and 0.8 on the left.  Slightly down from previous but symptoms about the same.  No change.  Plan recheck in six months.   Carotid stenosis Carotid duplex today shows a widely patent right carotid artery stent and stable 1 to 39% left ICA stenosis.  Doing well.  Continue current medical regimen.  Recheck in 6 months    09/15/2020, MD  04/26/2021 2:40 PM    This note was created with Dragon medical transcription system.  Any errors from dictation are purely unintentional

## 2021-04-26 NOTE — Assessment & Plan Note (Signed)
ABIs are 0.6 on the right and 0.8 on the left.  Slightly down from previous but symptoms about the same.  No change.  Plan recheck in six months.

## 2021-04-26 NOTE — Assessment & Plan Note (Signed)
Carotid duplex today shows a widely patent right carotid artery stent and stable 1 to 39% left ICA stenosis.  Doing well.  Continue current medical regimen.  Recheck in 6 months

## 2021-05-10 DIAGNOSIS — G2581 Restless legs syndrome: Secondary | ICD-10-CM | POA: Diagnosis not present

## 2021-05-10 DIAGNOSIS — E785 Hyperlipidemia, unspecified: Secondary | ICD-10-CM | POA: Diagnosis not present

## 2021-05-10 DIAGNOSIS — I340 Nonrheumatic mitral (valve) insufficiency: Secondary | ICD-10-CM | POA: Diagnosis not present

## 2021-05-10 DIAGNOSIS — J329 Chronic sinusitis, unspecified: Secondary | ICD-10-CM | POA: Diagnosis not present

## 2021-05-10 DIAGNOSIS — N951 Menopausal and female climacteric states: Secondary | ICD-10-CM | POA: Diagnosis not present

## 2021-05-10 DIAGNOSIS — E749 Disorder of carbohydrate metabolism, unspecified: Secondary | ICD-10-CM | POA: Diagnosis not present

## 2021-05-10 DIAGNOSIS — I10 Essential (primary) hypertension: Secondary | ICD-10-CM | POA: Diagnosis not present

## 2021-05-10 DIAGNOSIS — I2510 Atherosclerotic heart disease of native coronary artery without angina pectoris: Secondary | ICD-10-CM | POA: Diagnosis not present

## 2021-05-10 DIAGNOSIS — I351 Nonrheumatic aortic (valve) insufficiency: Secondary | ICD-10-CM | POA: Diagnosis not present

## 2021-05-10 DIAGNOSIS — G4709 Other insomnia: Secondary | ICD-10-CM | POA: Diagnosis not present

## 2021-05-10 DIAGNOSIS — M13869 Other specified arthritis, unspecified knee: Secondary | ICD-10-CM | POA: Diagnosis not present

## 2021-05-10 DIAGNOSIS — R69 Illness, unspecified: Secondary | ICD-10-CM | POA: Diagnosis not present

## 2021-07-07 DIAGNOSIS — E785 Hyperlipidemia, unspecified: Secondary | ICD-10-CM | POA: Diagnosis not present

## 2021-07-07 DIAGNOSIS — R7301 Impaired fasting glucose: Secondary | ICD-10-CM | POA: Diagnosis not present

## 2021-07-07 DIAGNOSIS — E749 Disorder of carbohydrate metabolism, unspecified: Secondary | ICD-10-CM | POA: Diagnosis not present

## 2021-07-11 DIAGNOSIS — E749 Disorder of carbohydrate metabolism, unspecified: Secondary | ICD-10-CM | POA: Diagnosis not present

## 2021-07-11 DIAGNOSIS — E785 Hyperlipidemia, unspecified: Secondary | ICD-10-CM | POA: Diagnosis not present

## 2021-07-11 DIAGNOSIS — M13869 Other specified arthritis, unspecified knee: Secondary | ICD-10-CM | POA: Diagnosis not present

## 2021-07-11 DIAGNOSIS — G4709 Other insomnia: Secondary | ICD-10-CM | POA: Diagnosis not present

## 2021-07-11 DIAGNOSIS — G2581 Restless legs syndrome: Secondary | ICD-10-CM | POA: Diagnosis not present

## 2021-07-11 DIAGNOSIS — J329 Chronic sinusitis, unspecified: Secondary | ICD-10-CM | POA: Diagnosis not present

## 2021-07-11 DIAGNOSIS — N951 Menopausal and female climacteric states: Secondary | ICD-10-CM | POA: Diagnosis not present

## 2021-07-11 DIAGNOSIS — F418 Other specified anxiety disorders: Secondary | ICD-10-CM | POA: Diagnosis not present

## 2021-07-11 DIAGNOSIS — I10 Essential (primary) hypertension: Secondary | ICD-10-CM | POA: Diagnosis not present

## 2021-07-11 DIAGNOSIS — F17200 Nicotine dependence, unspecified, uncomplicated: Secondary | ICD-10-CM | POA: Diagnosis not present

## 2021-07-12 DIAGNOSIS — I10 Essential (primary) hypertension: Secondary | ICD-10-CM | POA: Diagnosis not present

## 2021-07-12 DIAGNOSIS — I351 Nonrheumatic aortic (valve) insufficiency: Secondary | ICD-10-CM | POA: Diagnosis not present

## 2021-07-12 DIAGNOSIS — I340 Nonrheumatic mitral (valve) insufficiency: Secondary | ICD-10-CM | POA: Diagnosis not present

## 2021-07-12 DIAGNOSIS — I2510 Atherosclerotic heart disease of native coronary artery without angina pectoris: Secondary | ICD-10-CM | POA: Diagnosis not present

## 2021-07-12 DIAGNOSIS — E785 Hyperlipidemia, unspecified: Secondary | ICD-10-CM | POA: Diagnosis not present

## 2021-08-13 ENCOUNTER — Inpatient Hospital Stay
Admission: EM | Admit: 2021-08-13 | Discharge: 2021-08-15 | DRG: 392 | Disposition: A | Discharge status: Home / Self Care | Payer: PPO | Attending: Internal Medicine | Admitting: Internal Medicine

## 2021-08-13 ENCOUNTER — Emergency Department: Payer: PPO

## 2021-08-13 ENCOUNTER — Other Ambulatory Visit: Payer: Self-pay

## 2021-08-13 DIAGNOSIS — K529 Noninfective gastroenteritis and colitis, unspecified: Secondary | ICD-10-CM | POA: Diagnosis not present

## 2021-08-13 DIAGNOSIS — Z7982 Long term (current) use of aspirin: Secondary | ICD-10-CM

## 2021-08-13 DIAGNOSIS — A4150 Gram-negative sepsis, unspecified: Secondary | ICD-10-CM | POA: Diagnosis not present

## 2021-08-13 DIAGNOSIS — F32A Depression, unspecified: Secondary | ICD-10-CM | POA: Diagnosis present

## 2021-08-13 DIAGNOSIS — B9689 Other specified bacterial agents as the cause of diseases classified elsewhere: Secondary | ICD-10-CM | POA: Diagnosis not present

## 2021-08-13 DIAGNOSIS — Z66 Do not resuscitate: Secondary | ICD-10-CM | POA: Diagnosis not present

## 2021-08-13 DIAGNOSIS — Z91040 Latex allergy status: Secondary | ICD-10-CM | POA: Diagnosis not present

## 2021-08-13 DIAGNOSIS — R109 Unspecified abdominal pain: Secondary | ICD-10-CM | POA: Diagnosis not present

## 2021-08-13 DIAGNOSIS — Z885 Allergy status to narcotic agent status: Secondary | ICD-10-CM | POA: Diagnosis not present

## 2021-08-13 DIAGNOSIS — Z20822 Contact with and (suspected) exposure to covid-19: Secondary | ICD-10-CM | POA: Diagnosis present

## 2021-08-13 DIAGNOSIS — Z9109 Other allergy status, other than to drugs and biological substances: Secondary | ICD-10-CM

## 2021-08-13 DIAGNOSIS — Z7902 Long term (current) use of antithrombotics/antiplatelets: Secondary | ICD-10-CM

## 2021-08-13 DIAGNOSIS — Z90710 Acquired absence of both cervix and uterus: Secondary | ICD-10-CM

## 2021-08-13 DIAGNOSIS — R110 Nausea: Secondary | ICD-10-CM | POA: Diagnosis not present

## 2021-08-13 DIAGNOSIS — F418 Other specified anxiety disorders: Secondary | ICD-10-CM | POA: Diagnosis present

## 2021-08-13 DIAGNOSIS — R112 Nausea with vomiting, unspecified: Secondary | ICD-10-CM | POA: Diagnosis not present

## 2021-08-13 DIAGNOSIS — Z8249 Family history of ischemic heart disease and other diseases of the circulatory system: Secondary | ICD-10-CM | POA: Diagnosis not present

## 2021-08-13 DIAGNOSIS — E785 Hyperlipidemia, unspecified: Secondary | ICD-10-CM | POA: Diagnosis present

## 2021-08-13 DIAGNOSIS — I959 Hypotension, unspecified: Secondary | ICD-10-CM | POA: Diagnosis not present

## 2021-08-13 DIAGNOSIS — I739 Peripheral vascular disease, unspecified: Secondary | ICD-10-CM | POA: Diagnosis present

## 2021-08-13 DIAGNOSIS — R7989 Other specified abnormal findings of blood chemistry: Secondary | ICD-10-CM

## 2021-08-13 DIAGNOSIS — Z880 Allergy status to penicillin: Secondary | ICD-10-CM

## 2021-08-13 DIAGNOSIS — J984 Other disorders of lung: Secondary | ICD-10-CM | POA: Diagnosis not present

## 2021-08-13 DIAGNOSIS — N390 Urinary tract infection, site not specified: Secondary | ICD-10-CM | POA: Diagnosis not present

## 2021-08-13 DIAGNOSIS — R011 Cardiac murmur, unspecified: Secondary | ICD-10-CM | POA: Diagnosis present

## 2021-08-13 DIAGNOSIS — F17210 Nicotine dependence, cigarettes, uncomplicated: Secondary | ICD-10-CM | POA: Diagnosis not present

## 2021-08-13 DIAGNOSIS — R61 Generalized hyperhidrosis: Secondary | ICD-10-CM | POA: Diagnosis not present

## 2021-08-13 DIAGNOSIS — I10 Essential (primary) hypertension: Secondary | ICD-10-CM | POA: Diagnosis not present

## 2021-08-13 DIAGNOSIS — F419 Anxiety disorder, unspecified: Secondary | ICD-10-CM | POA: Diagnosis not present

## 2021-08-13 DIAGNOSIS — R197 Diarrhea, unspecified: Secondary | ICD-10-CM | POA: Diagnosis not present

## 2021-08-13 DIAGNOSIS — R9431 Abnormal electrocardiogram [ECG] [EKG]: Secondary | ICD-10-CM | POA: Diagnosis not present

## 2021-08-13 DIAGNOSIS — M47817 Spondylosis without myelopathy or radiculopathy, lumbosacral region: Secondary | ICD-10-CM | POA: Diagnosis not present

## 2021-08-13 DIAGNOSIS — R32 Unspecified urinary incontinence: Secondary | ICD-10-CM | POA: Diagnosis present

## 2021-08-13 DIAGNOSIS — N200 Calculus of kidney: Secondary | ICD-10-CM | POA: Diagnosis not present

## 2021-08-13 DIAGNOSIS — Z79890 Hormone replacement therapy (postmenopausal): Secondary | ICD-10-CM

## 2021-08-13 DIAGNOSIS — R778 Other specified abnormalities of plasma proteins: Secondary | ICD-10-CM | POA: Diagnosis not present

## 2021-08-13 DIAGNOSIS — I248 Other forms of acute ischemic heart disease: Secondary | ICD-10-CM | POA: Diagnosis not present

## 2021-08-13 DIAGNOSIS — R1110 Vomiting, unspecified: Secondary | ICD-10-CM | POA: Diagnosis not present

## 2021-08-13 DIAGNOSIS — K449 Diaphragmatic hernia without obstruction or gangrene: Secondary | ICD-10-CM | POA: Diagnosis not present

## 2021-08-13 DIAGNOSIS — Z79899 Other long term (current) drug therapy: Secondary | ICD-10-CM

## 2021-08-13 DIAGNOSIS — R1111 Vomiting without nausea: Secondary | ICD-10-CM | POA: Diagnosis not present

## 2021-08-13 LAB — URINALYSIS, ROUTINE W REFLEX MICROSCOPIC
Glucose, UA: NEGATIVE mg/dL
Hgb urine dipstick: NEGATIVE
Ketones, ur: 5 mg/dL — AB
Leukocytes,Ua: NEGATIVE
Nitrite: NEGATIVE
Protein, ur: 100 mg/dL — AB
Specific Gravity, Urine: 1.025 (ref 1.005–1.030)
pH: 5 (ref 5.0–8.0)

## 2021-08-13 LAB — CBC WITH DIFFERENTIAL/PLATELET
Abs Immature Granulocytes: 0.03 10*3/uL (ref 0.00–0.07)
Basophils Absolute: 0.1 10*3/uL (ref 0.0–0.1)
Basophils Relative: 1 %
Eosinophils Absolute: 0.1 10*3/uL (ref 0.0–0.5)
Eosinophils Relative: 1 %
HCT: 48.8 % — ABNORMAL HIGH (ref 36.0–46.0)
Hemoglobin: 16.3 g/dL — ABNORMAL HIGH (ref 12.0–15.0)
Immature Granulocytes: 0 %
Lymphocytes Relative: 3 %
Lymphs Abs: 0.4 10*3/uL — ABNORMAL LOW (ref 0.7–4.0)
MCH: 31.6 pg (ref 26.0–34.0)
MCHC: 33.4 g/dL (ref 30.0–36.0)
MCV: 94.6 fL (ref 80.0–100.0)
Monocytes Absolute: 0.8 10*3/uL (ref 0.1–1.0)
Monocytes Relative: 6 %
Neutro Abs: 11.1 10*3/uL — ABNORMAL HIGH (ref 1.7–7.7)
Neutrophils Relative %: 89 %
Platelets: 253 10*3/uL (ref 150–400)
RBC: 5.16 MIL/uL — ABNORMAL HIGH (ref 3.87–5.11)
RDW: 13 % (ref 11.5–15.5)
WBC: 12.4 10*3/uL — ABNORMAL HIGH (ref 4.0–10.5)
nRBC: 0 % (ref 0.0–0.2)

## 2021-08-13 LAB — COMPREHENSIVE METABOLIC PANEL
ALT: 30 U/L (ref 0–44)
AST: 36 U/L (ref 15–41)
Albumin: 4.3 g/dL (ref 3.5–5.0)
Alkaline Phosphatase: 72 U/L (ref 38–126)
Anion gap: 12 (ref 5–15)
BUN: 18 mg/dL (ref 8–23)
CO2: 26 mmol/L (ref 22–32)
Calcium: 8.7 mg/dL — ABNORMAL LOW (ref 8.9–10.3)
Chloride: 102 mmol/L (ref 98–111)
Creatinine, Ser: 0.96 mg/dL (ref 0.44–1.00)
GFR, Estimated: >60 mL/min (ref >60)
Glucose, Bld: 139 mg/dL — ABNORMAL HIGH (ref 70–99)
Potassium: 4.1 mmol/L (ref 3.5–5.1)
Sodium: 140 mmol/L (ref 135–145)
Total Bilirubin: 0.5 mg/dL (ref 0.3–1.2)
Total Protein: 6.9 g/dL (ref 6.5–8.1)

## 2021-08-13 LAB — TROPONIN I (HIGH SENSITIVITY)
Troponin I (High Sensitivity): 131 ng/L (ref <18)
Troponin I (High Sensitivity): 270 ng/L (ref <18)

## 2021-08-13 LAB — PROTIME-INR
INR: 0.9 (ref 0.8–1.2)
Prothrombin Time: 12.5 seconds (ref 11.4–15.2)

## 2021-08-13 LAB — APTT: aPTT: 27 seconds (ref 24–36)

## 2021-08-13 LAB — LIPASE, BLOOD: Lipase: 40 U/L (ref 11–51)

## 2021-08-13 LAB — LACTIC ACID, PLASMA: Lactic Acid, Venous: 1.5 mmol/L (ref 0.5–1.9)

## 2021-08-13 MED ORDER — ONDANSETRON HCL 4 MG PO TABS: 4.0000 mg | ORAL_TABLET | Freq: Four times a day (QID) | ORAL | Status: DC | PRN

## 2021-08-13 MED ORDER — CLOPIDOGREL BISULFATE 75 MG PO TABS
75.0000 mg | ORAL_TABLET | Freq: Every day | ORAL | Status: DC
  Administered 2021-08-14 – 2021-08-15 (×2): 75 mg via ORAL
  Filled 2021-08-13 (×2): qty 1

## 2021-08-13 MED ORDER — MELATONIN 5 MG PO TABS
10.0000 mg | ORAL_TABLET | Freq: Every day | ORAL | Status: DC
  Administered 2021-08-13 – 2021-08-14 (×2): 10 mg via ORAL
  Filled 2021-08-13 (×2): qty 2

## 2021-08-13 MED ORDER — DULOXETINE HCL 30 MG PO CPEP
60.0000 mg | ORAL_CAPSULE | Freq: Every day | ORAL | Status: DC
  Administered 2021-08-14 – 2021-08-15 (×2): 60 mg via ORAL
  Filled 2021-08-13: qty 1
  Filled 2021-08-13: qty 2

## 2021-08-13 MED ORDER — ASPIRIN EC 81 MG PO TBEC
81.0000 mg | DELAYED_RELEASE_TABLET | Freq: Every day | ORAL | Status: DC
  Administered 2021-08-14 – 2021-08-15 (×2): 81 mg via ORAL
  Filled 2021-08-13 (×2): qty 1

## 2021-08-13 MED ORDER — ENOXAPARIN SODIUM 40 MG/0.4ML IJ SOSY: 40.0000 mg | PREFILLED_SYRINGE | INTRAMUSCULAR | Status: DC

## 2021-08-13 MED ORDER — CHLORTHALIDONE 25 MG PO TABS
25.0000 mg | ORAL_TABLET | Freq: Every day | ORAL | Status: DC
  Administered 2021-08-14 – 2021-08-15 (×2): 25 mg via ORAL
  Filled 2021-08-13 (×2): qty 1

## 2021-08-13 MED ORDER — CLONAZEPAM 0.5 MG PO TABS
0.5000 mg | ORAL_TABLET | Freq: Every day | ORAL | Status: DC
  Administered 2021-08-14 – 2021-08-15 (×2): 0.5 mg via ORAL
  Filled 2021-08-13 (×2): qty 1

## 2021-08-13 MED ORDER — ACETAMINOPHEN 650 MG RE SUPP: 650.0000 mg | Freq: Four times a day (QID) | RECTAL | Status: DC | PRN

## 2021-08-13 MED ORDER — MAGNESIUM HYDROXIDE 400 MG/5ML PO SUSP: 30.0000 mL | Freq: Every day | ORAL | Status: DC | PRN

## 2021-08-13 MED ORDER — ROSUVASTATIN CALCIUM 10 MG PO TABS
10.0000 mg | ORAL_TABLET | Freq: Every day | ORAL | Status: DC
  Administered 2021-08-14 – 2021-08-15 (×2): 10 mg via ORAL
  Filled 2021-08-13 (×2): qty 1

## 2021-08-13 MED ORDER — FLUTICASONE PROPIONATE 50 MCG/ACT NA SUSP
1.0000 | Freq: Every day | NASAL | Status: DC | PRN
  Filled 2021-08-13: qty 16

## 2021-08-13 MED ORDER — VITAMIN D 25 MCG (1000 UNIT) PO TABS
1000.0000 [IU] | ORAL_TABLET | Freq: Every day | ORAL | Status: DC
  Administered 2021-08-14 – 2021-08-15 (×2): 1000 [IU] via ORAL
  Filled 2021-08-13 (×2): qty 1

## 2021-08-13 MED ORDER — ASPIRIN 81 MG PO CHEW
324.0000 mg | CHEWABLE_TABLET | Freq: Once | ORAL | Status: AC
  Administered 2021-08-13: 324 mg via ORAL
  Filled 2021-08-13: qty 4

## 2021-08-13 MED ORDER — SODIUM CHLORIDE 0.9 % IV SOLN
2.0000 g | INTRAVENOUS | Status: DC
  Administered 2021-08-13: 2 g via INTRAVENOUS
  Filled 2021-08-13: qty 20

## 2021-08-13 MED ORDER — ONDANSETRON HCL 4 MG/2ML IJ SOLN
4.0000 mg | Freq: Four times a day (QID) | INTRAMUSCULAR | Status: DC | PRN
  Administered 2021-08-14: 4 mg via INTRAVENOUS
  Filled 2021-08-13: qty 2

## 2021-08-13 MED ORDER — IRBESARTAN 150 MG PO TABS
75.0000 mg | ORAL_TABLET | Freq: Every day | ORAL | Status: DC
  Administered 2021-08-14 – 2021-08-15 (×2): 75 mg via ORAL
  Filled 2021-08-13 (×2): qty 1

## 2021-08-13 MED ORDER — TRAZODONE HCL 50 MG PO TABS
25.0000 mg | ORAL_TABLET | Freq: Every evening | ORAL | Status: DC | PRN
  Administered 2021-08-14: 25 mg via ORAL
  Filled 2021-08-13: qty 1

## 2021-08-13 MED ORDER — ACETAMINOPHEN 325 MG PO TABS: 650.0000 mg | ORAL_TABLET | Freq: Four times a day (QID) | ORAL | Status: DC | PRN

## 2021-08-13 NOTE — ED Notes (Signed)
Pt up to use bathroom- pt having episode of diarrhea that pt states is all liquid

## 2021-08-13 NOTE — ED Provider Notes (Signed)
[facility] Provider Note    Event Date/Time   First MD Initiated Contact with Patient 08/13/21 1750     (approximate)   History   Emesis   HPI  Bethany Lozano is a 71 y.o. female who reports that 3 times in the last several weeks she has eaten something and began vomiting repeatedly.  Happened again today and she had some diarrhea with it.  She has some neck stiffness and tightness posteriorly but no other symptoms at all.  Nausea went away with some Zofran and EMS      Physical Exam   Triage Vital Signs: ED Triage Vitals  Enc Vitals Group     BP 08/13/21 1749 (!) 116/57     Pulse Rate 08/13/21 1749 70     Resp 08/13/21 1749 18     Temp --      Temp src --      SpO2 08/13/21 1749 98 %     Weight 08/13/21 1751 148 lb (67.1 kg)     Height 08/13/21 1751 5\' 5"  (1.651 m)     Head Circumference --      Peak Flow --      Pain Score 08/13/21 1751 8     Pain Loc --      Pain Edu? --      Excl. in [place]? --     Most recent vital signs: Vitals:   08/13/21 1930 08/13/21 2100  BP: 106/61 108/64  Pulse: 83 94  Resp: 18 17  SpO2: 99% 96%   General: Awake, no distress.  CV:  Good peripheral perfusion.  Heart regular rate and rhythm no audible murmurs Resp:  Normal effort.  Lungs are clear Abd:  No distention.  Abdomen soft bowel sounds are positive there is no tenderness Extremities: No edema   ED Results / Procedures / Treatments   Labs (all labs ordered are listed, but only abnormal results are displayed) Labs Reviewed  COMPREHENSIVE METABOLIC PANEL - Abnormal; Notable for the following components:      Result Value   Glucose, Bld 139 (*)    Calcium 8.7 (*)    All other components within normal limits  URINALYSIS, ROUTINE W REFLEX MICROSCOPIC - Abnormal; Notable for the following components:   Color, Urine AMBER (*)    APPearance CLOUDY (*)    Bilirubin Urine SMALL (*)    Ketones, ur 5 (*)    Protein, ur 100 (*)    Bacteria, UA RARE (*)     All other components within normal limits  CBC WITH DIFFERENTIAL/PLATELET - Abnormal; Notable for the following components:   WBC 12.4 (*)    RBC 5.16 (*)    Hemoglobin 16.3 (*)    HCT 48.8 (*)    Neutro Abs 11.1 (*)    Lymphs Abs 0.4 (*)    All other components within normal limits  TROPONIN I (HIGH SENSITIVITY) - Abnormal; Notable for the following components:   Troponin I (High Sensitivity) 131 (*)    All other components within normal limits  GASTROINTESTINAL PANEL BY PCR, STOOL (REPLACES STOOL CULTURE)  C DIFFICILE QUICK SCREEN W PCR REFLEX    LIPASE, BLOOD  PROTIME-INR  APTT  LACTIC ACID, PLASMA  TROPONIN I (HIGH SENSITIVITY)     EKG  EKG read and interpreted by me shows normal sinus rhythm rate of 71 normal axis no acute ST-T wave changes essentially normal EKG EMS EKG also read interpreted by me shows  normal sinus rhythm at about 70 normal axis no acute ST-T changes there either.  RADIOLOGY CT read by radiology reviewed by me shows only some liquid stool. Acute abdominal series read by radiology reviewed by me did not seem to show anything much but I did not see a very large loop of colon there which in addition to the red cells in her urine may be want to get the CT of her belly.  PROCEDURES:  Critical Care performed: Critical care time 25 minutes.  This includes reviewing some of patient's old records and thinking carefully about what is going on with this patient finding the troponin and the further diarrhea.  In discussing the patient with the hospitalist.  I also reviewed all her studies. Procedures   MEDICATIONS ORDERED IN ED: Medications  aspirin chewable tablet 324 mg (has no administration in time range)     IMPRESSION / MDM / ASSESSMENT AND PLAN / ED COURSE  I reviewed the triage vital signs and the nursing notes. Patient's troponin comes back elevated at 131.  Patient did have some posterior neck pain and tightness.  Perhaps the vomiting is an  anginal equivalent.  Her white count is up a little bit and she has some red and white cells in her urine which could be a UTI as well.  This could explain her elevated white count additionally the CT renal did not show any stones but there was liquid stool in her colon implying that she will have diarrhea again before long.  This may be another cause for her white count.  I have discussed her in detail with the hospitalist.  We will get her in the hospital for the elevated troponin possible anginal equivalent, UTI and gastroenteritis with possible sepsis.      FINAL CLINICAL IMPRESSION(S) / ED DIAGNOSES   Final diagnoses:  Elevated troponin  Urinary tract infection with hematuria, site unspecified  Gastroenteritis     Rx / DC Orders   ED Discharge Orders     None        Note:  This document was prepared using Dragon voice recognition software and may include unintentional dictation errors.   Nena Polio, MD 08/13/21 2208

## 2021-08-13 NOTE — ED Notes (Signed)
Pt reports chronic posterior neck pain that feels worse before vomiting.

## 2021-08-13 NOTE — H&P (Addendum)
[place]   PATIENT NAME: Bethany Lozano    MR#:  [id number]  DATE OF BIRTH:  1950/11/01  DATE OF ADMISSION:  08/13/2021  PRIMARY CARE PHYSICIAN: Sherron Monday, MD   Patient is coming from: Home  REQUESTING/REFERRING PHYSICIAN: Dorothea Glassman, MD  CHIEF COMPLAINT:   Chief Complaint  Patient presents with   Emesis    HISTORY OF PRESENT ILLNESS:  Bethany Lozano is a 71 y.o. female with medical history significant for anxiety, depression, dyslipidemia, hypertension and peripheral vascular disease, who presented to the ER with acute onset of recurrent nausea and vomiting with associated diarrhea.  She admitted to diaphoresis today with her nausea and vomiting after lunch.  She had recurrent vomiting about 3 times the last few weeks.  She admits to neck feeling funny and posteriorly.  She was given Zofran by EMS with resolution of her nausea.  She denied any chest pain or palpitations.  No cough or wheezing or hemoptysis.  She has been having diminished urinary output and urinary frequency and urgency.  She has occasional urinary incontinence.  ED Course: When she came to the ER, BP was 116/57 with otherwise normal vital signs.  Labs revealed a glucose of 139 and calcium 8.7 with otherwise unremarkable CMP.  High-sensitivity troponin I was 131 and later 270.  Lactic acid was 1.5.  CBC showed leukocytosis of 12.4 with neutrophilia and hemoconcentration.  Coagulation profile was within normal.  UA showed 6-10 WBCs and 6-10 RBCs with rare bacteria, 100 protein and 5 ketones.  Stool pathogens were sent. EKG as reviewed by me : EKG showed normal sinus rhythm with a rate of 71 with poor R wave progression. Imaging: CT renal stone revealed no evidence for urinary tract stone disease or other urinary tract pathology.  It showed aortic atherosclerosis and liquid stool in the colon consistent with diarrhea.  Two-view abdomen x-ray showed no ileus, obstruction or free air.  It showed possible  bronchial thickening with mild pulmonary scarring with no pneumonia or collapse.  The patient was given 40 of aspirin.  I ordered IV Rocephin and IV fluids as well as IV heparin per protocol.  She will be admitted to a cardiac telemetry bed for further evaluation and management. PAST MEDICAL HISTORY:   Past Medical History:  Diagnosis Date   Anxiety    Depression    Heart murmur    Hemorrhoids    Hyperlipidemia    Peripheral vascular disease (HCC)    Sinusitis    Tobacco use   -Hypertension  PAST SURGICAL HISTORY:   Past Surgical History:  Procedure Laterality Date   ABDOMINAL HYSTERECTOMY  1991   APPENDECTOMY     BICEPT TENODESIS Left 10/08/2017   Procedure: BICEPS TENODESIS;  Surgeon: Signa Kell, MD;  Location: [facility] ORS;  Service: Orthopedics;  Laterality: Left;   CARDIAC CATHETERIZATION N/A 11/08/2015   Procedure: Left Heart Cath and Coronary Angiography;  Surgeon: Laurier Nancy, MD;  Location: [facility];  Service: Cardiovascular;  Laterality: N/A;   CARDIAC CATHETERIZATION     Mynx placed in right artery after heart catherization   CAROTID STENT INSERTION Right 05/15/2016   COLONOSCOPY  2014   ENDARTERECTOMY Right 11/25/2015   Procedure: ENDARTERECTOMY CAROTID;  Surgeon: Annice Needy, MD;  Location: [facility] ORS;  Service: Vascular;  Laterality: Right;   FISSURECTOMY  10/22/14   HEMORRHOIDECTOMY WITH HEMORRHOID BANDING  1991,06/20/14, 10/22/14   PERIPHERAL VASCULAR CATHETERIZATION Right 05/15/2016   Procedure:  Carotid PTA/Stent Intervention;  Surgeon: Annice Needy, MD;  Location: [facility];  Service: Cardiovascular;  Laterality: Right;   RESECTION DISTAL CLAVICAL  10/08/2017   Procedure: RESECTION DISTAL CLAVICAL;  Surgeon: Signa Kell, MD;  Location: [facility] ORS;  Service: Orthopedics;;   SHOULDER ARTHROSCOPY WITH SUBACROMIAL DECOMPRESSION Left 10/08/2017   Procedure: SHOULDER ARTHROSCOPY WITH SUBACROMIAL DECOMPRESSION;  Surgeon: Signa Kell, MD;  Location: [facility]  ORS;  Service: Orthopedics;  Laterality: Left;   SHOULDER OPEN ROTATOR CUFF REPAIR Left 10/08/2017   Procedure: ROTATOR CUFF REPAIR SHOULDER OPEN Extensive Glenoid Humeral debridement;  Surgeon: Signa Kell, MD;  Location: [facility] OR[facility];  Service: Orthopedics;  Laterality: Left;   TUBAL LIGATION  1977    SOCIAL HISTORY:   Social History   Tobacco Use   Smoking status: Every Day    Packs/day: 1.00    Years: 45.00    Pack years: 45.00    Types: Cigarettes   Smokeless tobacco: Never  Substance Use Topics   Alcohol use: Not Currently    Alcohol/week: 0.0 standard drinks    FAMILY HISTORY:   Family History  Problem Relation Age of Onset   Hypertension Mother    Osteoporosis Mother    Heart attack Father    Diabetes Brother    Breast cancer Neg Hx     DRUG ALLERGIES:   Allergies  Allergen Reactions   Codeine Nausea Only   Latex     Other reaction(s): Other (See Comments)   Adhesive [Tape] Itching and Rash   Penicillins Rash    Tolerates ampicillin  Has patient had a PCN reaction causing immediate rash, facial/tongue/throat swelling, SOB or lightheadedness with hypotension: no Has patient had a PCN reaction causing severe rash involving mucus membranes or skin necrosis: no Has patient had a PCN reaction that required hospitalization {no Has patient had a PCN reaction occurring within the last 10 years: no If all of the above answers are "NO", then may proceed with Cephalosporin use.    REVIEW OF SYSTEMS:   ROS As per history of present illness. All pertinent systems were reviewed above. Constitutional, HEENT, cardiovascular, respiratory, GI, GU, musculoskeletal, neuro, psychiatric, endocrine, integumentary and hematologic systems were reviewed and are otherwise negative/unremarkable except for positive findings mentioned above in the HPI.   MEDICATIONS AT HOME:   Prior to Admission medications   Medication Sig Start Date End Date Taking? Authorizing Provider  aspirin 81  MG EC tablet Take 81 mg by mouth daily. 04/12/15   [provider]  chlorthalidone (HYGROTON) 25 MG tablet Take 25 mg by mouth daily.  06/05/16   [provider]  Cholecalciferol (VITAMIN D3) 5000 units CAPS Take 1,000 Units by mouth daily.     [provider]  clonazePAM (KLONOPIN) 0.5 MG tablet Take 0.5 mg by mouth daily. 03/14/19   [provider]  clopidogrel (PLAVIX) 75 MG tablet TAKE 1 TABLET BY MOUTH EVERY DAY 09/06/20   Annice Needy, MD  DULoxetine (CYMBALTA) 60 MG capsule Take 60 mg by mouth daily. 07/14/20   [provider]  estradiol (ESTRACE) 0.5 MG tablet Take 0.5 mg by mouth every morning. 09/18/17   [provider]  fluticasone (FLONASE) 50 MCG/ACT nasal spray Place 1 spray into both nostrils daily as needed for allergies or rhinitis.  05/20/14   [provider]  Melatonin 10 MG TABS Take 10 mg by mouth at bedtime.    [provider]  REPATHA SURECLICK 140 MG/ML SOAJ Inject into the  skin. 09/03/20   [provider]  rosuvastatin (CRESTOR) 10 MG tablet Take 10 mg by mouth daily. 07/12/21   [provider]  valsartan (DIOVAN) 80 MG tablet Take 80 mg by mouth every morning. 05/10/21   [provider]      VITAL SIGNS:  Blood pressure 108/64, pulse 94, resp. rate 17, height 5\' 5"  (1.651 m), weight 67.1 kg, SpO2 96 %.  PHYSICAL EXAMINATION:  Physical Exam  GENERAL:  71 y.o.-year-old patient lying in the bed with no acute distress.  EYES: Pupils equal, round, reactive to light and accommodation. No scleral icterus. Extraocular muscles intact.  HEENT: Head atraumatic, normocephalic. Oropharynx and nasopharynx clear.  NECK:  Supple, no jugular venous distention. No thyroid enlargement, no tenderness.  LUNGS: Normal breath sounds bilaterally, no wheezing, rales,rhonchi or crepitation. No use of accessory muscles of respiration.  CARDIOVASCULAR: Regular rate and rhythm, S1, S2 normal. No murmurs,  rubs, or gallops.  ABDOMEN: Soft, nondistended, with mild suprapubic tenderness without rebound tenderness guarding or rigidity.  Bowel sounds present. No organomegaly or mass.  EXTREMITIES: No pedal edema, cyanosis, or clubbing.  NEUROLOGIC: Cranial nerves II through XII are intact. Muscle strength 5/5 in all extremities. Sensation intact. Gait not checked.  PSYCHIATRIC: The patient is alert and oriented x 3.  Normal affect and good eye contact. SKIN: No obvious rash, lesion, or ulcer.   LABORATORY PANEL:   CBC Recent Labs  Lab 08/13/21 2002  WBC 12.4*  HGB 16.3*  HCT 48.8*  PLT 253   ------------------------------------------------------------------------------------------------------------------  Chemistries  Recent Labs  Lab 08/13/21 2002  NA 140  K 4.1  CL 102  CO2 26  GLUCOSE 139*  BUN 18  CREATININE 0.96  CALCIUM 8.7*  AST 36  ALT 30  ALKPHOS 72  BILITOT 0.5   ------------------------------------------------------------------------------------------------------------------  Cardiac Enzymes No results for input(s): TROPONINI in the last 168 hours. ------------------------------------------------------------------------------------------------------------------  RADIOLOGY:  DG Abdomen Acute W/Chest  Result Date: 08/13/2021 CLINICAL DATA:  Repeated episodes of vomiting. Diarrhea. Diaphoresis. EXAM: DG ABDOMEN ACUTE WITH 1 VIEW CHEST COMPARISON:  12/24/2020 FINDINGS: Bowel gas pattern does not show evidence of ileus, obstruction or free air. Ordinary arterial calcification is noted. Ordinary mild lower lumbar degenerative changes. One-view chest shows normal heart size. Ordinary aortic atherosclerosis. Mild pulmonary scarring and bronchial thickening. IMPRESSION: Negative acute abdominal radiographs. No evidence of ileus, obstruction or free air. Possible bronchial thickening. Mild pulmonary scarring. No pneumonia or collapse. Electronically Signed   By: Paulina FusiMark  Shogry  M.D.   On: 08/13/2021 19:52   CT Renal Stone Study  Result Date: 08/13/2021 CLINICAL DATA:  Flank pain. Kidney stone suspected. Sudden onset of vomiting. EXAM: CT ABDOMEN AND PELVIS WITHOUT CONTRAST TECHNIQUE: Multidetector CT imaging of the abdomen and pelvis was performed following the standard protocol without IV contrast. RADIATION DOSE REDUCTION: This exam was performed according to the departmental dose-optimization program which includes automated exposure control, adjustment of the mA and/or kV according to patient size and/or use of iterative reconstruction technique. COMPARISON:  Radiography earlier today. FINDINGS: Lower chest: Lung bases are clear. Small hiatal hernia incidentally noted. Hepatobiliary: Liver parenchyma is normal without contrast. No calcified gallstones. Pancreas: Normal Spleen: Normal Adrenals/Urinary Tract: Adrenal glands are normal. Kidneys are normal. Bladder is normal. Stomach/Bowel: Small hiatal hernia. Stomach otherwise normal. Small bowel is normal. There is liquid stool in the colon, but no specific visible colon pathology by CT. Vascular/Lymphatic: Aortic atherosclerosis. No aneurysm. IVC is normal. No adenopathy. Reproductive: Previous hysterectomy.  No  pelvic mass. Other: No free fluid or air. Musculoskeletal: Chronic degenerative changes at L5-S1. IMPRESSION: No evidence of urinary tract stone disease or other urinary tract pathology. Aortic atherosclerosis.  No aneurysm. Patient has some liquid stool in the colon which could go along with diarrhea. No specific bowel finding otherwise. Electronically Signed   By: Paulina FusiMark  Shogry M.D.   On: 08/13/2021 21:38      IMPRESSION AND PLAN:  Principal Problem:   Sepsis due to gram-negative UTI (HCC)  1.  Elevated troponin in the setting of nausea and vomiting with diaphoresis. - While this could be related to  demand ischemia, will need to rule out ACS. - We will further trend serial troponins. - We will place the patient  for now on IV heparin. - She will be continued on aspirin and Plavix as well as statin therapy with higher dose. - She has not had any chest pain. - A 2D echo and a cardiology consult will be obtained. - I notified Dr. Welton FlakesKhan about the patient.  2.  Sepsis due to UTI.  Sepsis is manifested by leukocytosis and a heart rate of 94. - We will place the patient on hydration with IV normal saline. - Antibiotic therapy will be started with IV Rocephin. - We will follow urine and blood cultures.  3.  Diarrhea in the setting of nausea and vomiting it could be related to acute gastroenteritis. - The patient will be hydrated as mentioned above. - Stool pathogens were sent. - She has not taken any recent antibiotics. - IV Rocephin should cover infectious diarrhea pending stool pathogens.  4.  Dyslipidemia. - We will continue statin therapy.  5.  Essential hypertension. - We will continue Diovan and chlorthalidone.  6.  Anxiety and depression. - We will continue to Klonopin and Cymbalta.   DVT prophylaxis: IV heparin.   Advanced Care Planning:  Code Status: She is DNR/DNI.  I discussed it with her. Family Communication:  The plan of care was discussed in details with the patient (and family). I answered all questions. The patient agreed to proceed with the above mentioned plan. Further management will depend upon hospital course. Disposition Plan: Back to previous home environment Consults called: Cardiology.   All the records are reviewed and case discussed with ED provider.  Status is: Inpatient   At the time of the admission, it appears that the appropriate admission status for this patient is inpatient.  This is judged to be reasonable and necessary in order to provide the required intensity of service to ensure the patient's safety given the presenting symptoms, physical exam findings and initial radiographic and laboratory data in the context of comorbid conditions.  The patient requires  inpatient status due to high intensity of service, high risk of further deterioration and high frequency of surveillance required.  I certify that at the time of admission, it is my clinical judgment that the patient will require inpatient hospital care extending more than 2 midnights.                            Dispo: The patient is from: Home              Anticipated d/c is to: Home              Patient currently is not medically stable to d/c.              Difficult to place patient:  No  Hannah Beat M.D on 08/13/2021 at 10:29 PM  [facility]   From 7 PM-7 AM, contact night-coverage [email or web address]  CC: Primary care physician; Sherron Monday, MD

## 2021-08-13 NOTE — ED Triage Notes (Signed)
Pt arrives via EMS from home for sudden onset vomiting, diarrhea, and sweating- pt states she has vomited more than she can count- pt has a hx of carotid stent many years ago- pt denies any CP and only endorses abdominal pain when she was about to vomit- pt initial BP with EMS was 71/39- pt got NS and was 100/48 after that

## 2021-08-13 NOTE — ED Notes (Signed)
Repeat troponin is 270, original was 131.  MD Mansy and Jon Billings notified.

## 2021-08-13 NOTE — ED Notes (Signed)
Lab called to collect blood on pt

## 2021-08-13 NOTE — ED Notes (Signed)
Elevated troponin of 131 per lab.  EDP Malinda notified.

## 2021-08-14 ENCOUNTER — Encounter: Payer: Self-pay | Admitting: Family Medicine

## 2021-08-14 ENCOUNTER — Inpatient Hospital Stay
Admit: 2021-08-14 | Discharge: 2021-08-14 | Disposition: A | Discharge status: Home / Self Care | Payer: PPO | Attending: Family Medicine | Admitting: Family Medicine

## 2021-08-14 DIAGNOSIS — K529 Noninfective gastroenteritis and colitis, unspecified: Secondary | ICD-10-CM | POA: Diagnosis not present

## 2021-08-14 DIAGNOSIS — R778 Other specified abnormalities of plasma proteins: Secondary | ICD-10-CM

## 2021-08-14 LAB — BASIC METABOLIC PANEL
Anion gap: 8 (ref 5–15)
BUN: 20 mg/dL (ref 8–23)
CO2: 28 mmol/L (ref 22–32)
Calcium: 8 mg/dL — ABNORMAL LOW (ref 8.9–10.3)
Chloride: 99 mmol/L (ref 98–111)
Creatinine, Ser: 0.72 mg/dL (ref 0.44–1.00)
GFR, Estimated: >60 mL/min (ref >60)
Glucose, Bld: 107 mg/dL — ABNORMAL HIGH (ref 70–99)
Potassium: 3.6 mmol/L (ref 3.5–5.1)
Sodium: 135 mmol/L (ref 135–145)

## 2021-08-14 LAB — RESPIRATORY PANEL BY PCR

## 2021-08-14 LAB — GASTROINTESTINAL PANEL BY PCR, STOOL (REPLACES STOOL CULTURE)

## 2021-08-14 LAB — HEPARIN LEVEL (UNFRACTIONATED): Heparin Unfractionated: 0.47 IU/mL (ref 0.30–0.70)

## 2021-08-14 LAB — CBC
HCT: 39.1 % (ref 36.0–46.0)
Hemoglobin: 12.9 g/dL (ref 12.0–15.0)
MCH: 31.2 pg (ref 26.0–34.0)
MCHC: 33 g/dL (ref 30.0–36.0)
MCV: 94.4 fL (ref 80.0–100.0)
Platelets: 215 10*3/uL (ref 150–400)
RBC: 4.14 MIL/uL (ref 3.87–5.11)
RDW: 13.2 % (ref 11.5–15.5)
WBC: 8.3 10*3/uL (ref 4.0–10.5)
nRBC: 0 % (ref 0.0–0.2)

## 2021-08-14 LAB — RESP PANEL BY RT-PCR (FLU A&B, COVID) ARPGX2
Influenza A by PCR: NEGATIVE
Influenza B by PCR: NEGATIVE
SARS Coronavirus 2 by RT PCR: NEGATIVE

## 2021-08-14 LAB — CORTISOL-AM, BLOOD: Cortisol - AM: 7.7 ug/dL (ref 6.7–22.6)

## 2021-08-14 LAB — C DIFFICILE QUICK SCREEN W PCR REFLEX
C Diff antigen: NEGATIVE
C Diff interpretation: NOT DETECTED
C Diff toxin: NEGATIVE

## 2021-08-14 LAB — PROTIME-INR
INR: 1 (ref 0.8–1.2)
Prothrombin Time: 13.1 seconds (ref 11.4–15.2)

## 2021-08-14 LAB — PROCALCITONIN: Procalcitonin: 6.22 ng/mL

## 2021-08-14 LAB — TROPONIN I (HIGH SENSITIVITY)
Troponin I (High Sensitivity): 329 ng/L (ref <18)
Troponin I (High Sensitivity): 210 ng/L (ref <18)

## 2021-08-14 MED ORDER — HEPARIN (PORCINE) 25000 UT/250ML-% IV SOLN
850.0000 [IU]/h | INTRAVENOUS | Status: DC
  Administered 2021-08-14: 850 [IU]/h via INTRAVENOUS
  Filled 2021-08-14: qty 250

## 2021-08-14 MED ORDER — HEPARIN BOLUS VIA INFUSION
4000.0000 [IU] | Freq: Once | INTRAVENOUS | Status: AC
  Administered 2021-08-14: 4000 [IU] via INTRAVENOUS
  Filled 2021-08-14: qty 4000

## 2021-08-14 NOTE — Progress Notes (Signed)
Progress Note   Patient: Bethany Lozano GYF:[id number] DOB: February 05, 1951 DOA: 08/13/2021     1 DOS: the patient was seen and examined on 08/14/2021   Brief hospital course: Taken from H&P.  Bethany Lozano is a 71 y.o. female with medical history significant for anxiety, depression, dyslipidemia, hypertension and peripheral vascular disease, who presented to the ER with acute onset of recurrent nausea and vomiting with associated diarrhea.   Patient was experiencing intermittent vomiting for the past couple of, since yesterday she was having recurrent vomiting and diarrhea.  Denies any urinary symptoms.  On arrival to ED she was hemodynamically stable.  Mildly elevated troponin which peaked at 329 and now trending down.  Most likely secondary to demand ischemia.  She was started on heparin infusion by the admitting provider and her cardiologist was consulted-waiting for their recommendations. Mild leukocytosis which has been resolved today.  Procalcitonin elevated at 6.22.  No cultures were sent.  UA with very few leukocytes and rare bacteria, no urinary symptoms. She was started on ceftriaxone. She had 1 bowel movement this morning and GI pathogen panel was negative.  C. difficile was negative. Respiratory viral panel negative. CT renal stone was without any significant abnormality. Abdominal x-ray with no ileus or obstruction. Chest x-ray with some possible bronchial thickening with mild pulmonary scarring, no evidence of pneumonia or collapse.   Assessment and Plan: * Gastroenteritis Symptoms are concerning for gastroenteritis.  GI pathogen panel negative.  C. difficile negative. Intermittent nausea and couple of vomit for the past couple of weeks. Symptoms seems improving. -Continue with supportive care  Elevated troponin Mildly elevated troponin, peaked at 329 and now trending down.  No chest pain or shortness of breath.  Echo was ordered-pending She was started on heparin infusion by  admitting provider. Doubt it is ACS-most likely demand ischemia with GI symptoms. Cardiology was also notified by admitting provider. -No need for heparin infusion-I will defer that decision to cardiology-pending consult -Continue home dose of aspirin, Plavix and Crestor  Sepsis due to gram-negative UTI (HCC)- (present on admission) Sepsis ruled out.  Patient does not have any urinary symptoms and UA with only rare bacteria and few leukocytes.  Leukocytosis can be reactive with gastroenteritis.  Respiratory viral panel negative.  No cultures were sent. She was started on ceftriaxone. Procalcitonin elevated-no obvious source. -Stop ceftriaxone and observe.  Essential hypertension- (present on admission) Blood pressure within goal. -Continue home dose of valsartan  Hyperlipidemia- (present on admission) - Continue home dose of Crestor  Depression with anxiety- (present on admission) No acute concern.   -Continue home dose of Klonopin and Cymbalta   Subjective: Patient was seen and examined today.  Just had 1 bowel movement since this morning.  Denies any chest pain.  Continues to have mild nausea, no vomiting.  Able to tolerate some diet.  Physical Exam: Vitals:   08/14/21 1000 08/14/21 1002 08/14/21 1200 08/14/21 1300  BP: 99/78  (!) 117/52 (!) 111/52  Pulse: 67  68 72  Resp: 16  11 15   Temp:  97.8 F (36.6 C)    SpO2: 96%  98% 100%  Weight:      Height:       General.  Well-developed lady, in no acute distress. Pulmonary.  Lungs clear bilaterally, normal respiratory effort. CV.  Regular rate and rhythm, no JVD, rub or murmur. Abdomen.  Soft, nontender, nondistended, BS positive. CNS.  Alert and oriented x3.  No focal neurologic deficit. Extremities.  No edema,  no cyanosis, pulses intact and symmetrical. Psychiatry.  Judgment and insight appears normal.  Data Reviewed: Prior notes, labs and imaging reviewed.  Family Communication:   Disposition: Status is:  Inpatient Remains inpatient appropriate because: Severity of illness    Planned Discharge Destination: Home  Time spent: 45 minutes  This record has been created using Conservation officer, historic buildings. Errors have been sought and corrected,but may not always be located. Such creation errors do not reflect on the standard of care.  Author: Arnetha Courser, MD 08/14/2021 1:29 PM  For on call review [email or web address].

## 2021-08-14 NOTE — Assessment & Plan Note (Signed)
No acute concern.   -Continue home dose of Klonopin and Cymbalta

## 2021-08-14 NOTE — Assessment & Plan Note (Signed)
Symptoms are concerning for gastroenteritis.  GI pathogen panel negative.  C. difficile negative. Intermittent nausea and couple of vomit for the past couple of weeks. Symptoms seems improving. -Continue with supportive care

## 2021-08-14 NOTE — Hospital Course (Addendum)
Taken from H&P.  Bethany Lozano is a 71 y.o. female with medical history significant for anxiety, depression, dyslipidemia, hypertension and peripheral vascular disease, who presented to the ER with acute onset of recurrent nausea and vomiting with associated diarrhea.   Patient was experiencing intermittent vomiting for the past couple of, since yesterday she was having recurrent vomiting and diarrhea.  Denies any urinary symptoms.  On arrival to ED she was hemodynamically stable.  Mildly elevated troponin which peaked at 329 and now trending down.  Most likely secondary to demand ischemia.  She was started on heparin infusion by the admitting provider and her cardiologist was consulted-waiting for their recommendations. Mild leukocytosis which has been resolved today.  Procalcitonin elevated at 6.22.  No cultures were sent.  UA with very few leukocytes and rare bacteria, no urinary symptoms. She was started on ceftriaxone. She had 1 bowel movement this morning and GI pathogen panel was negative.  C. difficile was negative. Respiratory viral panel negative. CT renal stone was without any significant abnormality. Abdominal x-ray with no ileus or obstruction. Chest x-ray with some possible bronchial thickening with mild pulmonary scarring, no evidence of pneumonia or collapse.

## 2021-08-14 NOTE — Progress Notes (Signed)
ANTICOAGULATION CONSULT NOTE  Pharmacy Consult for heparin infusion Indication: ACS/STEMI  Allergies  Allergen Reactions   Codeine Nausea Only   Latex     Other reaction(s): Other (See Comments)   Adhesive [Tape] Itching and Rash   Penicillins Rash    Tolerates ampicillin  Has patient had a PCN reaction causing immediate rash, facial/tongue/throat swelling, SOB or lightheadedness with hypotension: no Has patient had a PCN reaction causing severe rash involving mucus membranes or skin necrosis: no Has patient had a PCN reaction that required hospitalization {no Has patient had a PCN reaction occurring within the last 10 years: no If all of the above answers are "NO", then may proceed with Cephalosporin use.    Patient Measurements: Height: 5\' 5"  (165.1 cm) Weight: 67.1 kg (148 lb) IBW/kg (Calculated) : 57 Heparin Dosing Weight: 67.1 kg  Vital Signs: BP: 117/69 (02/12 0700) Pulse Rate: 65 (02/12 0700)  Labs: Recent Labs    08/13/21 2002 08/13/21 2245 08/14/21 0100 08/14/21 0646  HGB 16.3*  --   --  12.9  HCT 48.8*  --   --  39.1  PLT 253  --   --  215  APTT  --  27  --   --   LABPROT  --  12.5  --  13.1  INR  --  0.9  --  1.0  HEPARINUNFRC  --   --   --  0.47  CREATININE 0.96  --   --  0.72  TROPONINIHS 131* 270* 329*  --      Estimated Creatinine Clearance: 59.7 mL/min (by C-G formula based on SCr of 0.72 mg/dL).   Medical History: Past Medical History:  Diagnosis Date   Anxiety    Depression    Heart murmur    Hemorrhoids    Hyperlipidemia    Peripheral vascular disease (HCC)    Sinusitis    Tobacco use     Assessment:  Pt is 71 yo female with h/o cardiac stent presenting to ED after sudden onset of vomiting, diarrhea, and sweating, found with elevated troponin I lvl trending up.  Goal of Therapy:  Heparin level 0.3-0.7 units/ml Monitor platelets by anticoagulation protocol: Yes   Plan:  2/12@0646 : HL 0.47, therapeutic x 1 Continue heparin  infusion at 850 units/hr Check confirmatory HL in 6 hrs  CBC daily while on heparin  78, PharmD Clinical Pharmacist 08/14/2021 7:35 AM

## 2021-08-14 NOTE — Assessment & Plan Note (Signed)
Continue home dose of Crestor ?

## 2021-08-14 NOTE — Progress Notes (Addendum)
Pt with a feeling of passing out. Upon assessment pt noted to dry heave and reported nausea. Vitals obtained. WNL. Medicated with IV zofran for nausea. Will continue to observe.

## 2021-08-14 NOTE — Assessment & Plan Note (Signed)
Sepsis ruled out.  Patient does not have any urinary symptoms and UA with only rare bacteria and few leukocytes.  Leukocytosis can be reactive with gastroenteritis.  Respiratory viral panel negative.  No cultures were sent. She was started on ceftriaxone. Procalcitonin elevated-no obvious source. -Stop ceftriaxone and observe.

## 2021-08-14 NOTE — Assessment & Plan Note (Signed)
Blood pressure within goal. -Continue home dose of valsartan

## 2021-08-14 NOTE — Progress Notes (Signed)
[place] for heparin infusion Indication: ACS/STEMI  Allergies  Allergen Reactions   Codeine Nausea Only   Latex     Other reaction(s): Other (See Comments)   Adhesive [Tape] Itching and Rash   Penicillins Rash    Tolerates ampicillin  Has patient had a PCN reaction causing immediate rash, facial/tongue/throat swelling, SOB or lightheadedness with hypotension: no Has patient had a PCN reaction causing severe rash involving mucus membranes or skin necrosis: no Has patient had a PCN reaction that required hospitalization {no Has patient had a PCN reaction occurring within the last 10 years: no If all of the above answers are "NO", then may proceed with Cephalosporin use.    Patient Measurements: Height: 5\' 5"  (165.1 cm) Weight: 67.1 kg (148 lb) IBW/kg (Calculated) : 57 Heparin Dosing Weight: 67.1 kg  Vital Signs: BP: 108/64 (02/11 2100) Pulse Rate: 94 (02/11 2100)  Labs: Recent Labs    08/13/21 2002 08/13/21 2245  HGB 16.3*  --   HCT 48.8*  --   PLT 253  --   APTT  --  27  LABPROT  --  12.5  INR  --  0.9  CREATININE 0.96  --   TROPONINIHS 131* 270*    Estimated Creatinine Clearance: 49.8 mL/min (by C-G formula based on SCr of 0.96 mg/dL).   Medical History: Past Medical History:  Diagnosis Date   Anxiety    Depression    Heart murmur    Hemorrhoids    Hyperlipidemia    Peripheral vascular disease ([place])    Sinusitis    Tobacco use     Assessment:  Pt is 71 yo female with h/o cardiac stent presenting to ED after sudden onset of vomiting, diarrhea, and sweating, found with elevated troponin I lvl trending up.  Goal of Therapy:  Heparin level 0.3-0.7 units/ml Monitor platelets by anticoagulation protocol: Yes   Plan:  Bolus 4000 units x 1 Start heparin infusion at 850 units/hr Check HL in 6 hrs after start of infusion CBC daily while on heparin  Renda Rolls, PharmD, [facility] 08/14/2021 12:28 AM

## 2021-08-14 NOTE — Assessment & Plan Note (Signed)
Mildly elevated troponin, peaked at 329 and now trending down.  No chest pain or shortness of breath.  Echo was ordered-pending She was started on heparin infusion by admitting provider. Doubt it is ACS-most likely demand ischemia with GI symptoms. Cardiology was also notified by admitting provider. -No need for heparin infusion-I will defer that decision to cardiology-pending consult -Continue home dose of aspirin, Plavix and Crestor

## 2021-08-15 DIAGNOSIS — K529 Noninfective gastroenteritis and colitis, unspecified: Secondary | ICD-10-CM | POA: Diagnosis not present

## 2021-08-15 LAB — ECHOCARDIOGRAM COMPLETE
AR max vel: 1.86 cm2
AV Area VTI: 1.93 cm2
AV Area mean vel: 1.93 cm2
AV Mean grad: 8 mmHg
AV Peak grad: 17.8 mmHg
Ao pk vel: 2.11 m/s
Area-P 1/2: 3.12 cm2
Calc EF: 79.4 %
Height: 65 in
P 1/2 time: 376 msec
S' Lateral: 2.94 cm
Single Plane A2C EF: 84.3 %
Single Plane A4C EF: 75.9 %
Weight: 2368 oz

## 2021-08-15 MED ORDER — ONDANSETRON HCL 4 MG PO TABS: 4.0000 mg | ORAL_TABLET | Freq: Four times a day (QID) | ORAL | 0 refills | Status: DC | PRN

## 2021-08-15 NOTE — Progress Notes (Signed)
Pt discharged to home. DC instructions given. No concerns voiced. Reminded to stop by pharmacy and pick up med that was e-prescribed by Provider. Voiced understanding. Pt left unit in wheelchair pushed by hospital Volunteer. Left in stable condition.  VWilliams,RN.

## 2021-08-15 NOTE — TOC CM/SW Note (Signed)
Patient has orders to discharge home today. Chart reviewed. PCP is Dr. Tejan-Sie. On room air. No wounds. No TOC needs identified. CSW signing off.  Johnross Nabozny, CSW [phone]  

## 2021-08-15 NOTE — Discharge Summary (Signed)
Physician Discharge Summary   Patient: Bethany Lozano MRN: [id number] DOB: [birth year]  Admit date:     08/13/2021  Discharge date: 08/15/21  Discharge Physician: Arnetha CourserSumayya Newel Oien   PCP: Sherron Mondayejan-Sie, S Ahmed, MD   Recommendations at discharge:  1.  Please follow-up with your primary care doctor and cardiologist in 1 week 2.  Patient needs to follow-up with a gastroenterologist if her symptoms persist.  Discharge Diagnoses: Principal Problem:   Gastroenteritis Active Problems:   Elevated troponin   Sepsis due to gram-negative UTI [facility](HCC)   Essential hypertension   Hyperlipidemia   Depression with anxiety   Hospital Course: Taken from H&P.  Bethany Lozano is a 71 y.o. female with medical history significant for anxiety, depression, dyslipidemia, hypertension and peripheral vascular disease, who presented to the ER with acute onset of recurrent nausea and vomiting with associated diarrhea.   Patient was experiencing intermittent vomiting for the past couple of, since yesterday she was having recurrent vomiting and diarrhea.  Denies any urinary symptoms.  On arrival to ED she was hemodynamically stable.  Mildly elevated troponin which peaked at 329 and now trending down.  Most likely secondary to demand ischemia.  She was started on heparin infusion by the admitting provider and her cardiologist was consulted-waiting for their recommendations. Mild leukocytosis which has been resolved today.  Procalcitonin elevated at 6.22.  No cultures were sent.  UA with very few leukocytes and rare bacteria, no urinary symptoms. She was started on ceftriaxone. She had 1 bowel movement this morning and GI pathogen panel was negative.  C. difficile was negative. Respiratory viral panel negative. CT renal stone was without any significant abnormality. Abdominal x-ray with no ileus or obstruction. Chest x-ray with some possible bronchial thickening with mild pulmonary scarring, no evidence of pneumonia or  collapse.  Sepsis ruled out.  Patient does not have any urinary symptoms.  Ceftriaxone was discontinued next day and patient remained stable.  Respiratory viral panel was negative.  GI pathogen panel was negative.  Most likely have gastroenteritis.  Per patient she was experiencing intermittent nausea and vomiting for more than a week now.  If her symptoms persist, she needs to see a gastroenterologist for further work-up and recommendations.  Patient was also found to have mildly elevated troponin, peaked at 329, most likely secondary to demand ischemia with gastroenteritis.  Echocardiogram done-pending results.  Discussed with her cardiologist and they will follow-up as an outpatient.  Low suspicion for ACS.  Patient will continue with rest of her home medications and follow-up with her providers.  Assessment and Plan: * Gastroenteritis Symptoms are concerning for gastroenteritis.  GI pathogen panel negative.  C. difficile negative. Intermittent nausea and couple of vomit for the past couple of weeks. Symptoms seems improving. -Continue with supportive care  Elevated troponin Mildly elevated troponin, peaked at 329 and now trending down.  No chest pain or shortness of breath.  Echo was ordered-pending She was started on heparin infusion by admitting provider. Doubt it is ACS-most likely demand ischemia with GI symptoms. Cardiology was also notified by admitting provider. -No need for heparin infusion-I will defer that decision to cardiology-pending consult -Continue home dose of aspirin, Plavix and Crestor  Sepsis due to gram-negative UTI (HCC)- (present on admission) Sepsis ruled out.  Patient does not have any urinary symptoms and UA with only rare bacteria and few leukocytes.  Leukocytosis can be reactive with gastroenteritis.  Respiratory viral panel negative.  No cultures were sent. She was started on  ceftriaxone. Procalcitonin elevated-no obvious source. -Stop ceftriaxone and  observe.  Essential hypertension- (present on admission) Blood pressure within goal. -Continue home dose of valsartan  Hyperlipidemia- (present on admission) - Continue home dose of Crestor  Depression with anxiety- (present on admission) No acute concern.   -Continue home dose of Klonopin and Cymbalta   Consultants: Cardiology Procedures performed: None Disposition: Home Diet recommendation:  Discharge Diet Orders (From admission, onward)     Start     Ordered   08/15/21 0000  Diet - low sodium heart healthy        08/15/21 1229           Cardiac diet  DISCHARGE MEDICATION: Allergies as of 08/15/2021       Reactions   Codeine Nausea Only   Latex    Other reaction(s): Other (See Comments)   Adhesive [tape] Itching, Rash   Penicillins Rash   Tolerates ampicillin  Has patient had a PCN reaction causing immediate rash, facial/tongue/throat swelling, SOB or lightheadedness with hypotension: no Has patient had a PCN reaction causing severe rash involving mucus membranes or skin necrosis: no Has patient had a PCN reaction that required hospitalization {no Has patient had a PCN reaction occurring within the last 10 years: no If all of the above answers are "NO", then may proceed with Cephalosporin use.        Medication List     TAKE these medications    aspirin 81 MG EC tablet Take 81 mg by mouth daily.   chlorthalidone 25 MG tablet Commonly known as: HYGROTON Take 25 mg by mouth daily.   clonazePAM 0.5 MG tablet Commonly known as: KLONOPIN Take 0.5 mg by mouth daily.   clopidogrel 75 MG tablet Commonly known as: PLAVIX TAKE 1 TABLET BY MOUTH EVERY DAY   DULoxetine 60 MG capsule Commonly known as: CYMBALTA Take 60 mg by mouth daily.   estradiol 0.5 MG tablet Commonly known as: ESTRACE Take 0.5 mg by mouth every morning.   fluticasone 50 MCG/ACT nasal spray Commonly known as: FLONASE Place 1 spray into both nostrils daily as needed for allergies  or rhinitis.   Melatonin 10 MG Tabs Take 10 mg by mouth at bedtime.   ondansetron 4 MG tablet Commonly known as: ZOFRAN Take 1 tablet (4 mg total) by mouth every 6 (six) hours as needed for nausea.   Repatha SureClick 140 MG/ML Soaj Generic drug: Evolocumab Inject into the skin every 14 (fourteen) days.   rosuvastatin 10 MG tablet Commonly known as: CRESTOR Take 10 mg by mouth daily.   valsartan 80 MG tablet Commonly known as: DIOVAN Take 80 mg by mouth every morning.   Vitamin D3 125 MCG (5000 UT) Caps Take 1,000 Units by mouth daily.        Follow-up Information     Sherron Mondayejan-Sie, S Ahmed, MD. Schedule an appointment as soon as possible for a visit in 1 week(s).   Specialty: Internal Medicine Contact information: [address] [place] [address] [phone]         Laurier NancyKhan, Shaukat A, MD. Nyra CapesGo on 08/23/2021.   Specialty: Cardiology Why: At 9 AM Contact information: [address] [place] [place] [address] [phone]                 Discharge Exam: Ceasar MonsFiled Weights   08/13/21 1751 08/14/21 1540  Weight: 67.1 kg 67.3 kg   General.  Well-developed lady, sitting comfortably in chair, in no acute distress. Pulmonary.  Lungs clear bilaterally, normal  respiratory effort. CV.  Regular rate and rhythm, no JVD, rub or murmur. Abdomen.  Soft, nontender, nondistended, BS positive. CNS.  Alert and oriented x3.  No focal neurologic deficit. Extremities.  No edema, no cyanosis, pulses intact and symmetrical. Psychiatry.  Judgment and insight appears normal.   Condition at discharge: good  The results of significant diagnostics from this hospitalization (including imaging, microbiology, ancillary and laboratory) are listed below for reference.   Imaging Studies: DG Abdomen Acute W/Chest  Result Date: 08/13/2021 CLINICAL DATA:  Repeated episodes of vomiting. Diarrhea. Diaphoresis. EXAM: DG ABDOMEN ACUTE WITH 1 VIEW CHEST COMPARISON:  12/24/2020 FINDINGS: Bowel gas  pattern does not show evidence of ileus, obstruction or free air. Ordinary arterial calcification is noted. Ordinary mild lower lumbar degenerative changes. One-view chest shows normal heart size. Ordinary aortic atherosclerosis. Mild pulmonary scarring and bronchial thickening. IMPRESSION: Negative acute abdominal radiographs. No evidence of ileus, obstruction or free air. Possible bronchial thickening. Mild pulmonary scarring. No pneumonia or collapse. Electronically Signed   By: Paulina Fusi M.D.   On: 08/13/2021 19:52   CT Renal Stone Study  Result Date: 08/13/2021 CLINICAL DATA:  Flank pain. Kidney stone suspected. Sudden onset of vomiting. EXAM: CT ABDOMEN AND PELVIS WITHOUT CONTRAST TECHNIQUE: Multidetector CT imaging of the abdomen and pelvis was performed following the standard protocol without IV contrast. RADIATION DOSE REDUCTION: This exam was performed according to the departmental dose-optimization program which includes automated exposure control, adjustment of the mA and/or kV according to patient size and/or use of iterative reconstruction technique. COMPARISON:  Radiography earlier today. FINDINGS: Lower chest: Lung bases are clear. Small hiatal hernia incidentally noted. Hepatobiliary: Liver parenchyma is normal without contrast. No calcified gallstones. Pancreas: Normal Spleen: Normal Adrenals/Urinary Tract: Adrenal glands are normal. Kidneys are normal. Bladder is normal. Stomach/Bowel: Small hiatal hernia. Stomach otherwise normal. Small bowel is normal. There is liquid stool in the colon, but no specific visible colon pathology by CT. Vascular/Lymphatic: Aortic atherosclerosis. No aneurysm. IVC is normal. No adenopathy. Reproductive: Previous hysterectomy.  No pelvic mass. Other: No free fluid or air. Musculoskeletal: Chronic degenerative changes at L5-S1. IMPRESSION: No evidence of urinary tract stone disease or other urinary tract pathology. Aortic atherosclerosis.  No aneurysm. Patient  has some liquid stool in the colon which could go along with diarrhea. No specific bowel finding otherwise. Electronically Signed   By: Paulina Fusi M.D.   On: 08/13/2021 21:38    Microbiology: Results for orders placed or performed during the hospital encounter of 08/13/21  Respiratory (~20 pathogens) panel by PCR     Status: None   Collection Time: 08/13/21 10:33 PM   Specimen: Nasopharyngeal Swab; Respiratory  Result Value Ref Range Status   Adenovirus NOT DETECTED NOT DETECTED Final   Coronavirus 229E NOT DETECTED NOT DETECTED Final    Comment: (NOTE) The Coronavirus on the Respiratory Panel, DOES NOT test for the novel  Coronavirus (2019 nCoV)    Coronavirus HKU1 NOT DETECTED NOT DETECTED Final   Coronavirus NL63 NOT DETECTED NOT DETECTED Final   Coronavirus OC43 NOT DETECTED NOT DETECTED Final   Metapneumovirus NOT DETECTED NOT DETECTED Final   Rhinovirus / Enterovirus NOT DETECTED NOT DETECTED Final   Influenza A NOT DETECTED NOT DETECTED Final   Influenza B NOT DETECTED NOT DETECTED Final   Parainfluenza Virus 1 NOT DETECTED NOT DETECTED Final   Parainfluenza Virus 2 NOT DETECTED NOT DETECTED Final   Parainfluenza Virus 3 NOT DETECTED NOT DETECTED Final   Parainfluenza Virus 4  NOT DETECTED NOT DETECTED Final   Respiratory Syncytial Virus NOT DETECTED NOT DETECTED Final   Bordetella pertussis NOT DETECTED NOT DETECTED Final   Bordetella Parapertussis NOT DETECTED NOT DETECTED Final   Chlamydophila pneumoniae NOT DETECTED NOT DETECTED Final   Mycoplasma pneumoniae NOT DETECTED NOT DETECTED Final    Comment: Performed at [facility], [address], [place], [place] [address]  Resp Panel by RT-PCR (Flu A&B, Covid) Nasopharyngeal Swab     Status: None   Collection Time: 08/14/21  7:32 AM   Specimen: Nasopharyngeal Swab; Nasopharyngeal(NP) swabs in vial transport medium  Result Value Ref Range Status   SARS Coronavirus 2 by RT PCR NEGATIVE NEGATIVE Final    Comment:  (NOTE) SARS-CoV-2 target nucleic acids are NOT DETECTED.  The SARS-CoV-2 RNA is generally detectable in upper respiratory specimens during the acute phase of infection. The lowest concentration of SARS-CoV-2 viral copies this assay can detect is 138 copies/mL. A negative result does not preclude SARS-Cov-2 infection and should not be used as the sole basis for treatment or other patient management decisions. A negative result may occur with  improper specimen collection/handling, submission of specimen other than nasopharyngeal swab, presence of viral mutation(s) within the areas targeted by this assay, and inadequate number of viral copies(<138 copies/mL). A negative result must be combined with clinical observations, patient history, and epidemiological information. The expected result is Negative.  Fact Sheet for Patients:  BloggerCourse.com  Fact Sheet for Healthcare Providers:  SeriousBroker.it  This test is no t yet approved or cleared by the [place] FDA and  has been authorized for detection and/or diagnosis of SARS-CoV-2 by FDA under an Emergency Use Authorization (EUA). This EUA will remain  in effect (meaning this test can be used) for the duration of the COVID-19 declaration under Section 564(b)(1) of the Act, 21 U.S.C.section 360bbb-3(b)(1), unless the authorization is terminated  or revoked sooner.       Influenza A by PCR NEGATIVE NEGATIVE Final   Influenza B by PCR NEGATIVE NEGATIVE Final    Comment: (NOTE) The Xpert Xpress SARS-CoV-2/FLU/RSV plus assay is intended as an aid in the diagnosis of influenza from Nasopharyngeal swab specimens and should not be used as a sole basis for treatment. Nasal washings and aspirates are unacceptable for Xpert Xpress SARS-CoV-2/FLU/RSV testing.  Fact Sheet for Patients: BloggerCourse.com  Fact Sheet for Healthcare  Providers: SeriousBroker.it  This test is not yet approved or cleared by the [place] FDA and has been authorized for detection and/or diagnosis of SARS-CoV-2 by FDA under an Emergency Use Authorization (EUA). This EUA will remain in effect (meaning this test can be used) for the duration of the COVID-19 declaration under Section 564(b)(1) of the Act, 21 U.S.C. section 360bbb-3(b)(1), unless the authorization is terminated or revoked.  Performed at [facility], [address] [place], [place] [address]   Gastrointestinal Panel by PCR , Stool     Status: None   Collection Time: 08/14/21  9:41 AM  Result Value Ref Range Status   Campylobacter species NOT DETECTED NOT DETECTED Final   Plesimonas shigelloides NOT DETECTED NOT DETECTED Final   Salmonella species NOT DETECTED NOT DETECTED Final   Yersinia enterocolitica NOT DETECTED NOT DETECTED Final   Vibrio species NOT DETECTED NOT DETECTED Final   Vibrio cholerae NOT DETECTED NOT DETECTED Final   Enteroaggregative E coli (EAEC) NOT DETECTED NOT DETECTED Final   Enteropathogenic E coli (EPEC) NOT DETECTED NOT DETECTED Final   Enterotoxigenic  E coli (ETEC) NOT DETECTED NOT DETECTED Final   Shiga like toxin producing E coli (STEC) NOT DETECTED NOT DETECTED Final   Shigella/Enteroinvasive E coli (EIEC) NOT DETECTED NOT DETECTED Final   Cryptosporidium NOT DETECTED NOT DETECTED Final   Cyclospora cayetanensis NOT DETECTED NOT DETECTED Final   Entamoeba histolytica NOT DETECTED NOT DETECTED Final   Giardia lamblia NOT DETECTED NOT DETECTED Final   Adenovirus F40/41 NOT DETECTED NOT DETECTED Final   Astrovirus NOT DETECTED NOT DETECTED Final   Norovirus GI/GII NOT DETECTED NOT DETECTED Final   Rotavirus A NOT DETECTED NOT DETECTED Final   Sapovirus (I, II, IV, and V) NOT DETECTED NOT DETECTED Final    Comment: Performed at [facility], [address] [place], [place] [address]  C  Difficile Quick Screen w PCR reflex     Status: None   Collection Time: 08/14/21  9:41 AM  Result Value Ref Range Status   C Diff antigen NEGATIVE NEGATIVE Final   C Diff toxin NEGATIVE NEGATIVE Final   C Diff interpretation No C. difficile detected.  Final    Comment: Performed at [facility], [address] [place], [place] [address]    Labs: CBC: Recent Labs  Lab 08/13/21 2002 08/14/21 0646  WBC 12.4* 8.3  NEUTROABS 11.1*  --   HGB 16.3* 12.9  HCT 48.8* 39.1  MCV 94.6 94.4  PLT 253 215   Basic Metabolic Panel: Recent Labs  Lab 08/13/21 2002 08/14/21 0646  NA 140 135  K 4.1 3.6  CL 102 99  CO2 26 28  GLUCOSE 139* 107*  BUN 18 20  CREATININE 0.96 0.72  CALCIUM 8.7* 8.0*   Liver Function Tests: Recent Labs  Lab 08/13/21 2002  AST 36  ALT 30  ALKPHOS 72  BILITOT 0.5  PROT 6.9  ALBUMIN 4.3   CBG: No results for input(s): GLUCAP in the last 168 hours.  Discharge time spent: greater than 30 minutes.  Signed: Arnetha Courser, MD [facility] 08/15/2021

## 2021-08-15 NOTE — Consult Note (Signed)
Bethany Lozano is a 71 y.o. female  [id number]  Primary Cardiologist: Adrian Blackwater, MD Reason for Consultation: elevated troponin levels  HPI: Bethany Lozano is a 71 y.o. female with medical history significant for anxiety, depression, dyslipidemia, hypertension and peripheral vascular disease, who presented to the ER with acute onset of recurrent nausea and vomiting with associated diarrhea.   Patient was experiencing intermittent vomiting for the past couple of weeks.   Review of Systems: denies chest pain, shortness of breath   Past Medical History:  Diagnosis Date   Anxiety    Depression    Heart murmur    Hemorrhoids    Hyperlipidemia    Peripheral vascular disease (HCC)    Sinusitis    Tobacco use     Medications Prior to Admission  Medication Sig Dispense Refill   aspirin 81 MG EC tablet Take 81 mg by mouth daily.     chlorthalidone (HYGROTON) 25 MG tablet Take 25 mg by mouth daily.   0   Cholecalciferol (VITAMIN D3) 5000 units CAPS Take 1,000 Units by mouth daily.      clonazePAM (KLONOPIN) 0.5 MG tablet Take 0.5 mg by mouth daily.     clopidogrel (PLAVIX) 75 MG tablet TAKE 1 TABLET BY MOUTH EVERY DAY 90 tablet 3   DULoxetine (CYMBALTA) 60 MG capsule Take 60 mg by mouth daily.     estradiol (ESTRACE) 0.5 MG tablet Take 0.5 mg by mouth every morning.  0   fluticasone (FLONASE) 50 MCG/ACT nasal spray Place 1 spray into both nostrils daily as needed for allergies or rhinitis.   0   Melatonin 10 MG TABS Take 10 mg by mouth at bedtime.     REPATHA SURECLICK 140 MG/ML SOAJ Inject into the skin every 14 (fourteen) days.     rosuvastatin (CRESTOR) 10 MG tablet Take 10 mg by mouth daily.     valsartan (DIOVAN) 80 MG tablet Take 80 mg by mouth every morning.        aspirin EC  81 mg Oral Daily   chlorthalidone  25 mg Oral Daily   cholecalciferol  1,000 Units Oral Daily   clonazePAM  0.5 mg Oral Daily   clopidogrel  75 mg Oral Daily   DULoxetine  60 mg Oral Daily   irbesartan   75 mg Oral Daily   melatonin  10 mg Oral QHS   rosuvastatin  10 mg Oral Daily    Infusions:   Allergies  Allergen Reactions   Codeine Nausea Only   Latex     Other reaction(s): Other (See Comments)   Adhesive [Tape] Itching and Rash   Penicillins Rash    Tolerates ampicillin  Has patient had a PCN reaction causing immediate rash, facial/tongue/throat swelling, SOB or lightheadedness with hypotension: no Has patient had a PCN reaction causing severe rash involving mucus membranes or skin necrosis: no Has patient had a PCN reaction that required hospitalization {no Has patient had a PCN reaction occurring within the last 10 years: no If all of the above answers are "NO", then may proceed with Cephalosporin use.    Social History   Socioeconomic History   Marital status: Single    Spouse name: Not on file   Number of children: Not on file   Years of education: Not on file   Highest education level: Not on file  Occupational History   Occupation: retired  Tobacco Use   Smoking status: Every Day    Packs/day: 1.00  Years: 45.00    Pack years: 45.00    Types: Cigarettes   Smokeless tobacco: Never   Tobacco comments:    Pt reports not ready to quit after education provided.  Vaping Use   Vaping Use: Never used  Substance and Sexual Activity   Alcohol use: Not Currently    Alcohol/week: 0.0 standard drinks   Drug use: No   Sexual activity: Not Currently    Birth control/protection: Post-menopausal  Other Topics Concern   Not on file  Social History Narrative   Not on file   Social Determinants of Health   Financial Resource Strain: Not on file  Food Insecurity: Not on file  Transportation Needs: Not on file  Physical Activity: Not on file  Stress: Not on file  Social Connections: Not on file  Intimate Partner Violence: Not on file    Family History  Problem Relation Age of Onset   Hypertension Mother    Osteoporosis Mother    Heart attack Father     Diabetes Brother    Breast cancer Neg Hx     PHYSICAL EXAM: Vitals:   08/15/21 0450 08/15/21 0805  BP: 122/62 (!) 160/67  Pulse: 65 60  Resp: 16 18  Temp: 98.1 F (36.7 C) (!) 97.5 F (36.4 C)  SpO2: 97% 100%    No intake or output data in the 24 hours ending 08/15/21 0849  General:  Well appearing. No respiratory difficulty HEENT: normal Neck: supple. no JVD. Carotids 2+ bilat; no bruits. No lymphadenopathy or thryomegaly appreciated. Cor: PMI nondisplaced. Regular rate & rhythm. No rubs, gallops or murmurs. Lungs: clear Abdomen: soft, nontender, nondistended. No hepatosplenomegaly. No bruits or masses. Good bowel sounds. Extremities: no cyanosis, clubbing, rash, edema Neuro: alert & oriented x 3, cranial nerves grossly intact. moves all 4 extremities w/o difficulty. Affect pleasant.  ECG: sinus rhythm, HR 71 bpm  Results for orders placed or performed during the hospital encounter of 08/13/21 (from the past 24 hour(s))  Gastrointestinal Panel by PCR , Stool     Status: None   Collection Time: 08/14/21  9:41 AM  Result Value Ref Range   Campylobacter species NOT DETECTED NOT DETECTED   Plesimonas shigelloides NOT DETECTED NOT DETECTED   Salmonella species NOT DETECTED NOT DETECTED   Yersinia enterocolitica NOT DETECTED NOT DETECTED   Vibrio species NOT DETECTED NOT DETECTED   Vibrio cholerae NOT DETECTED NOT DETECTED   Enteroaggregative E coli (EAEC) NOT DETECTED NOT DETECTED   Enteropathogenic E coli (EPEC) NOT DETECTED NOT DETECTED   Enterotoxigenic E coli (ETEC) NOT DETECTED NOT DETECTED   Shiga like toxin producing E coli (STEC) NOT DETECTED NOT DETECTED   Shigella/Enteroinvasive E coli (EIEC) NOT DETECTED NOT DETECTED   Cryptosporidium NOT DETECTED NOT DETECTED   Cyclospora cayetanensis NOT DETECTED NOT DETECTED   Entamoeba histolytica NOT DETECTED NOT DETECTED   Giardia lamblia NOT DETECTED NOT DETECTED   Adenovirus F40/41 NOT DETECTED NOT DETECTED   Astrovirus  NOT DETECTED NOT DETECTED   Norovirus GI/GII NOT DETECTED NOT DETECTED   Rotavirus A NOT DETECTED NOT DETECTED   Sapovirus (I, II, IV, and V) NOT DETECTED NOT DETECTED  C Difficile Quick Screen w PCR reflex     Status: None   Collection Time: 08/14/21  9:41 AM  Result Value Ref Range   C Diff antigen NEGATIVE NEGATIVE   C Diff toxin NEGATIVE NEGATIVE   C Diff interpretation No C. difficile detected.    DG Abdomen Acute W/Chest  Result Date: 08/13/2021 CLINICAL DATA:  Repeated episodes of vomiting. Diarrhea. Diaphoresis. EXAM: DG ABDOMEN ACUTE WITH 1 VIEW CHEST COMPARISON:  12/24/2020 FINDINGS: Bowel gas pattern does not show evidence of ileus, obstruction or free air. Ordinary arterial calcification is noted. Ordinary mild lower lumbar degenerative changes. One-view chest shows normal heart size. Ordinary aortic atherosclerosis. Mild pulmonary scarring and bronchial thickening. IMPRESSION: Negative acute abdominal radiographs. No evidence of ileus, obstruction or free air. Possible bronchial thickening. Mild pulmonary scarring. No pneumonia or collapse. Electronically Signed   By: Paulina Fusi M.D.   On: 08/13/2021 19:52   CT Renal Stone Study  Result Date: 08/13/2021 CLINICAL DATA:  Flank pain. Kidney stone suspected. Sudden onset of vomiting. EXAM: CT ABDOMEN AND PELVIS WITHOUT CONTRAST TECHNIQUE: Multidetector CT imaging of the abdomen and pelvis was performed following the standard protocol without IV contrast. RADIATION DOSE REDUCTION: This exam was performed according to the departmental dose-optimization program which includes automated exposure control, adjustment of the mA and/or kV according to patient size and/or use of iterative reconstruction technique. COMPARISON:  Radiography earlier today. FINDINGS: Lower chest: Lung bases are clear. Small hiatal hernia incidentally noted. Hepatobiliary: Liver parenchyma is normal without contrast. No calcified gallstones. Pancreas: Normal Spleen:  Normal Adrenals/Urinary Tract: Adrenal glands are normal. Kidneys are normal. Bladder is normal. Stomach/Bowel: Small hiatal hernia. Stomach otherwise normal. Small bowel is normal. There is liquid stool in the colon, but no specific visible colon pathology by CT. Vascular/Lymphatic: Aortic atherosclerosis. No aneurysm. IVC is normal. No adenopathy. Reproductive: Previous hysterectomy.  No pelvic mass. Other: No free fluid or air. Musculoskeletal: Chronic degenerative changes at L5-S1. IMPRESSION: No evidence of urinary tract stone disease or other urinary tract pathology. Aortic atherosclerosis.  No aneurysm. Patient has some liquid stool in the colon which could go along with diarrhea. No specific bowel finding otherwise. Electronically Signed   By: Paulina Fusi M.D.   On: 08/13/2021 21:38     ASSESSMENT AND PLAN: Patient reports multiple episodes of vomiting over the last several weeks. Denies chest pain, shortness of breath. Echo results pending. EKG normal. Troponin levels trending down, most likely demand ischemia. Will continue to follow.  Museum/gallery conservator FNP-C

## 2021-08-16 LAB — HIV ANTIBODY (ROUTINE TESTING W REFLEX): HIV Screen 4th Generation wRfx: NONREACTIVE

## 2021-08-17 DIAGNOSIS — G2581 Restless legs syndrome: Secondary | ICD-10-CM | POA: Diagnosis not present

## 2021-08-17 DIAGNOSIS — F418 Other specified anxiety disorders: Secondary | ICD-10-CM | POA: Diagnosis not present

## 2021-08-17 DIAGNOSIS — N951 Menopausal and female climacteric states: Secondary | ICD-10-CM | POA: Diagnosis not present

## 2021-08-17 DIAGNOSIS — J329 Chronic sinusitis, unspecified: Secondary | ICD-10-CM | POA: Diagnosis not present

## 2021-08-17 DIAGNOSIS — G4709 Other insomnia: Secondary | ICD-10-CM | POA: Diagnosis not present

## 2021-08-17 DIAGNOSIS — M13869 Other specified arthritis, unspecified knee: Secondary | ICD-10-CM | POA: Diagnosis not present

## 2021-08-17 DIAGNOSIS — E785 Hyperlipidemia, unspecified: Secondary | ICD-10-CM | POA: Diagnosis not present

## 2021-08-17 DIAGNOSIS — K5904 Chronic idiopathic constipation: Secondary | ICD-10-CM | POA: Diagnosis not present

## 2021-08-17 DIAGNOSIS — K521 Toxic gastroenteritis and colitis: Secondary | ICD-10-CM | POA: Diagnosis not present

## 2021-08-17 DIAGNOSIS — E749 Disorder of carbohydrate metabolism, unspecified: Secondary | ICD-10-CM | POA: Diagnosis not present

## 2021-08-17 DIAGNOSIS — I10 Essential (primary) hypertension: Secondary | ICD-10-CM | POA: Diagnosis not present

## 2021-08-17 DIAGNOSIS — F17200 Nicotine dependence, unspecified, uncomplicated: Secondary | ICD-10-CM | POA: Diagnosis not present

## 2021-08-22 DIAGNOSIS — E749 Disorder of carbohydrate metabolism, unspecified: Secondary | ICD-10-CM | POA: Diagnosis not present

## 2021-08-22 DIAGNOSIS — E785 Hyperlipidemia, unspecified: Secondary | ICD-10-CM | POA: Diagnosis not present

## 2021-08-22 DIAGNOSIS — G2581 Restless legs syndrome: Secondary | ICD-10-CM | POA: Diagnosis not present

## 2021-08-22 DIAGNOSIS — I10 Essential (primary) hypertension: Secondary | ICD-10-CM | POA: Diagnosis not present

## 2021-08-22 DIAGNOSIS — G4709 Other insomnia: Secondary | ICD-10-CM | POA: Diagnosis not present

## 2021-08-22 DIAGNOSIS — M13869 Other specified arthritis, unspecified knee: Secondary | ICD-10-CM | POA: Diagnosis not present

## 2021-08-22 DIAGNOSIS — F418 Other specified anxiety disorders: Secondary | ICD-10-CM | POA: Diagnosis not present

## 2021-08-22 DIAGNOSIS — M4802 Spinal stenosis, cervical region: Secondary | ICD-10-CM | POA: Diagnosis not present

## 2021-08-22 DIAGNOSIS — N951 Menopausal and female climacteric states: Secondary | ICD-10-CM | POA: Diagnosis not present

## 2021-08-22 DIAGNOSIS — K521 Toxic gastroenteritis and colitis: Secondary | ICD-10-CM | POA: Diagnosis not present

## 2021-08-22 DIAGNOSIS — K5904 Chronic idiopathic constipation: Secondary | ICD-10-CM | POA: Diagnosis not present

## 2021-08-22 DIAGNOSIS — M542 Cervicalgia: Secondary | ICD-10-CM | POA: Diagnosis not present

## 2021-08-24 ENCOUNTER — Other Ambulatory Visit (HOSPITAL_COMMUNITY): Payer: Self-pay | Admitting: Internal Medicine

## 2021-08-24 ENCOUNTER — Other Ambulatory Visit: Payer: Self-pay | Admitting: Internal Medicine

## 2021-08-24 DIAGNOSIS — M542 Cervicalgia: Secondary | ICD-10-CM

## 2021-08-24 DIAGNOSIS — M4802 Spinal stenosis, cervical region: Secondary | ICD-10-CM

## 2021-08-26 ENCOUNTER — Ambulatory Visit
Admission: RE | Admit: 2021-08-26 | Discharge: 2021-08-26 | Disposition: A | Discharge status: Home / Self Care | Payer: PPO | Source: Ambulatory Visit | Attending: Internal Medicine | Admitting: Internal Medicine

## 2021-08-26 ENCOUNTER — Other Ambulatory Visit: Payer: Self-pay

## 2021-08-26 DIAGNOSIS — M2578 Osteophyte, vertebrae: Secondary | ICD-10-CM | POA: Diagnosis not present

## 2021-08-26 DIAGNOSIS — M4802 Spinal stenosis, cervical region: Secondary | ICD-10-CM | POA: Diagnosis not present

## 2021-08-26 DIAGNOSIS — M542 Cervicalgia: Secondary | ICD-10-CM | POA: Diagnosis not present

## 2021-08-26 DIAGNOSIS — R202 Paresthesia of skin: Secondary | ICD-10-CM | POA: Diagnosis not present

## 2021-08-31 ENCOUNTER — Ambulatory Visit: Payer: PPO

## 2021-09-02 DIAGNOSIS — M5412 Radiculopathy, cervical region: Secondary | ICD-10-CM | POA: Diagnosis not present

## 2021-09-02 DIAGNOSIS — R202 Paresthesia of skin: Secondary | ICD-10-CM | POA: Diagnosis not present

## 2021-09-02 DIAGNOSIS — E569 Vitamin deficiency, unspecified: Secondary | ICD-10-CM | POA: Diagnosis not present

## 2021-09-02 DIAGNOSIS — G939 Disorder of brain, unspecified: Secondary | ICD-10-CM | POA: Diagnosis not present

## 2021-09-02 DIAGNOSIS — R112 Nausea with vomiting, unspecified: Secondary | ICD-10-CM | POA: Diagnosis not present

## 2021-09-02 DIAGNOSIS — R197 Diarrhea, unspecified: Secondary | ICD-10-CM | POA: Diagnosis not present

## 2021-09-02 DIAGNOSIS — R569 Unspecified convulsions: Secondary | ICD-10-CM | POA: Diagnosis not present

## 2021-09-02 DIAGNOSIS — Z9889 Other specified postprocedural states: Secondary | ICD-10-CM | POA: Diagnosis not present

## 2021-09-05 ENCOUNTER — Other Ambulatory Visit: Payer: Self-pay | Admitting: Neurology

## 2021-09-05 DIAGNOSIS — G939 Disorder of brain, unspecified: Secondary | ICD-10-CM

## 2021-09-14 ENCOUNTER — Other Ambulatory Visit: Payer: Medicare HMO

## 2021-09-14 ENCOUNTER — Ambulatory Visit
Admission: RE | Admit: 2021-09-14 | Discharge: 2021-09-14 | Disposition: A | Discharge status: Home / Self Care | Payer: PPO | Source: Ambulatory Visit | Attending: Neurology | Admitting: Neurology

## 2021-09-14 ENCOUNTER — Ambulatory Visit: Payer: Medicare HMO | Admitting: Oncology

## 2021-09-14 DIAGNOSIS — G939 Disorder of brain, unspecified: Secondary | ICD-10-CM | POA: Insufficient documentation

## 2021-09-17 DIAGNOSIS — R569 Unspecified convulsions: Secondary | ICD-10-CM | POA: Diagnosis not present

## 2021-09-19 ENCOUNTER — Other Ambulatory Visit: Payer: Self-pay | Admitting: *Deleted

## 2021-09-19 DIAGNOSIS — D751 Secondary polycythemia: Secondary | ICD-10-CM

## 2021-09-26 ENCOUNTER — Inpatient Hospital Stay: Payer: PPO | Admitting: Oncology

## 2021-09-26 ENCOUNTER — Inpatient Hospital Stay: Payer: PPO | Attending: Oncology

## 2021-09-26 ENCOUNTER — Other Ambulatory Visit: Payer: Self-pay

## 2021-09-26 ENCOUNTER — Encounter: Payer: Self-pay | Admitting: Oncology

## 2021-09-26 VITALS — BP 146/60 | Temp 96.6°F | Resp 18 | Wt 151.8 lb

## 2021-09-26 DIAGNOSIS — F17210 Nicotine dependence, cigarettes, uncomplicated: Secondary | ICD-10-CM | POA: Diagnosis not present

## 2021-09-26 DIAGNOSIS — Z87891 Personal history of nicotine dependence: Secondary | ICD-10-CM

## 2021-09-26 DIAGNOSIS — D751 Secondary polycythemia: Secondary | ICD-10-CM | POA: Diagnosis not present

## 2021-09-26 LAB — CBC WITH DIFFERENTIAL/PLATELET
Abs Immature Granulocytes: 0.02 10*3/uL (ref 0.00–0.07)
Basophils Absolute: 0.1 10*3/uL (ref 0.0–0.1)
Basophils Relative: 1 %
Eosinophils Absolute: 0.2 10*3/uL (ref 0.0–0.5)
Eosinophils Relative: 3 %
HCT: 43.4 % (ref 36.0–46.0)
Hemoglobin: 14.4 g/dL (ref 12.0–15.0)
Immature Granulocytes: 0 %
Lymphocytes Relative: 25 %
Lymphs Abs: 1.4 10*3/uL (ref 0.7–4.0)
MCH: 31 pg (ref 26.0–34.0)
MCHC: 33.2 g/dL (ref 30.0–36.0)
MCV: 93.5 fL (ref 80.0–100.0)
Monocytes Absolute: 0.6 10*3/uL (ref 0.1–1.0)
Monocytes Relative: 11 %
Neutro Abs: 3.2 10*3/uL (ref 1.7–7.7)
Neutrophils Relative %: 60 %
Platelets: 232 10*3/uL (ref 150–400)
RBC: 4.64 MIL/uL (ref 3.87–5.11)
RDW: 13.8 % (ref 11.5–15.5)
WBC: 5.4 10*3/uL (ref 4.0–10.5)
nRBC: 0 % (ref 0.0–0.2)

## 2021-09-26 LAB — COMPREHENSIVE METABOLIC PANEL
ALT: 26 U/L (ref 0–44)
AST: 36 U/L (ref 15–41)
Albumin: 4.4 g/dL (ref 3.5–5.0)
Alkaline Phosphatase: 65 U/L (ref 38–126)
Anion gap: 8 (ref 5–15)
BUN: 9 mg/dL (ref 8–23)
CO2: 28 mmol/L (ref 22–32)
Calcium: 9.1 mg/dL (ref 8.9–10.3)
Chloride: 99 mmol/L (ref 98–111)
Creatinine, Ser: 0.65 mg/dL (ref 0.44–1.00)
GFR, Estimated: >60 mL/min (ref >60)
Glucose, Bld: 101 mg/dL — ABNORMAL HIGH (ref 70–99)
Potassium: 3.6 mmol/L (ref 3.5–5.1)
Sodium: 135 mmol/L (ref 135–145)
Total Bilirubin: 0.4 mg/dL (ref 0.3–1.2)
Total Protein: 7.3 g/dL (ref 6.5–8.1)

## 2021-09-26 NOTE — Progress Notes (Signed)
?Hematology/Oncology Progress note ?[phone] [phone] ?  ? ? ? ?Patient Care Team: ?Sherron Monday, MD as PCP - General (Internal Medicine) ?Rickard Patience, MD as Consulting Physician (Hematology) ? ?REFERRING PROVIDER: ?Sherron Monday, MD  ?CHIEF COMPLAINTS/REASON FOR VISIT:  ?Follow up for erythrocytosis ? ?HISTORY OF PRESENTING ILLNESS:  ?Bethany Lozano is a  71 y.o.  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  ?CBC 07/10/2018 showed elevated hemoglobin at 16.2,  total white count 4.5 platelet counts 225,000.  ?Chronic Onset, duration since 2018.  ?No aggravating or alleviating factors.  ? ?Associated signs or symptoms: Denies weight loss, fever, chills, fatigue, night sweats.  ? ?Context:  ?Smoking history: current daily smoker.  ?History of blood clots: denies ?Family history of polycythemia: denies.  ? ?Per patient, she has had nocturnal oxymetry done which showed decreased oxygen level at night. Has not had sleep study done.  ? ? ?INTERVAL HISTORY ?Bethany Lozano is a 71 y.o. female who has above history reviewed by me today presents for follow up visit for management of erythrocytosis ?Patient reports recent recurrent episodes of nausea and vomiting accompanied by tingling of the neck, neck pain.  Patient sees neurology and neurosurgery for evaluation. ?08/27/2021, MRI cervical spine without contrast showed slight progression of moderate foraminal narrowing bilaterally at C6-7, left greater than right.  Slight progression of mild central canal stenosis at C5-6 and see 6/7.  Stable mild bilateral foraminal narrowing at C4-5 and C5-6, mild left foraminal narrowing at C3-4. ?09/15/2021 MRI of the brain showed nonspecific T2 hyperintense lesions of the white matter. ?No significant weight loss comparing to 6 months ago. ?Patient continues to smoke currently. ? ?Review of  Systems  ?Constitutional:  Negative for appetite change, chills, fatigue and fever.  ?HENT:   Negative for hearing loss and voice change.   ?Eyes:  Negative for eye problems.  ?Respiratory:  Negative for chest tightness, cough and shortness of breath.   ?Cardiovascular:  Negative for chest pain.  ?Gastrointestinal:  Positive for nausea and vomiting. Negative for abdominal distention, abdominal pain and blood in stool.  ?Endocrine: Negative for hot flashes.  ?Genitourinary:  Negative for difficulty urinating and frequency.   ?Musculoskeletal:  Positive for neck pain. Negative for arthralgias.  ?Skin:  Negative for itching and rash.  ?Neurological:  Positive for numbness. Negative for extremity weakness.  ?Hematological:  Negative for adenopathy.  ?Psychiatric/Behavioral:  Negative for confusion.   ? ?MEDICAL HISTORY:  ?Past Medical History:  ?Diagnosis Date  ? Anxiety   ? Depression   ? Heart murmur   ? Hemorrhoids   ? Hyperlipidemia   ? Peripheral vascular disease (HCC)   ? Sinusitis   ? Tobacco use   ? ? ?SURGICAL HISTORY: ?Past Surgical History:  ?Procedure Laterality Date  ? ABDOMINAL HYSTERECTOMY  1991  ? APPENDECTOMY    ? BICEPT TENODESIS Left 10/08/2017  ? Procedure: BICEPS TENODESIS;  Surgeon: Signa Kell, MD;  Location: [facility];  Service: Orthopedics;  Laterality: Left;  ? CARDIAC CATHETERIZATION N/A 11/08/2015  ? Procedure: Left Heart Cath and Coronary Angiography;  Surgeon: Laurier Nancy, MD;  Location: [facility];  Service: Cardiovascular;  Laterality: N/A;  ? CARDIAC CATHETERIZATION    ? Mynx placed in right artery after heart catherization  ? CAROTID STENT INSERTION Right 05/15/2016  ? COLONOSCOPY  2014  ? ENDARTERECTOMY Right 11/25/2015  ?  Procedure: ENDARTERECTOMY CAROTID;  Surgeon: Annice Needy, MD;  Location: [facility];  Service: Vascular;  Laterality: Right;  ? FISSURECTOMY  10/22/14  ? HEMORRHOIDECTOMY WITH HEMORRHOID BANDING  1991,06/20/14, 10/22/14  ? PERIPHERAL VASCULAR CATHETERIZATION  Right 05/15/2016  ? Procedure: Carotid PTA/Stent Intervention;  Surgeon: Annice Needy, MD;  Location: [facility];  Service: Cardiovascular;  Laterality: Right;  ? RESECTION DISTAL CLAVICAL  10/08/2017  ? Procedure: RESECTION DISTAL CLAVICAL;  Surgeon: Signa Kell, MD;  Location: [facility];  Service: Orthopedics;;  ? SHOULDER ARTHROSCOPY WITH SUBACROMIAL DECOMPRESSION Left 10/08/2017  ? Procedure: SHOULDER ARTHROSCOPY WITH SUBACROMIAL DECOMPRESSION;  Surgeon: Signa Kell, MD;  Location: [facility];  Service: Orthopedics;  Laterality: Left;  ? SHOULDER OPEN ROTATOR CUFF REPAIR Left 10/08/2017  ? Procedure: ROTATOR CUFF REPAIR SHOULDER OPEN Extensive Glenoid Humeral debridement;  Surgeon: Signa Kell, MD;  Location: [facility];  Service: Orthopedics;  Laterality: Left;  ? TUBAL LIGATION  1977  ? ? ?SOCIAL HISTORY: ?Social History  ? ?Socioeconomic History  ? Marital status: Single  ?  Spouse name: Not on file  ? Number of children: Not on file  ? Years of education: Not on file  ? Highest education level: Not on file  ?Occupational History  ? Occupation: retired  ?Tobacco Use  ? Smoking status: Every Day  ?  Packs/day: 1.00  ?  Years: 45.00  ?  Pack years: 45.00  ?  Types: Cigarettes  ? Smokeless tobacco: Never  ? Tobacco comments:  ?  Pt reports not ready to quit after education provided.  ?Vaping Use  ? Vaping Use: Never used  ?Substance and Sexual Activity  ? Alcohol use: Not Currently  ?  Alcohol/week: 0.0 standard drinks  ? Drug use: No  ? Sexual activity: Not Currently  ?  Birth control/protection: Post-menopausal  ?Other Topics Concern  ? Not on file  ?Social History Narrative  ? Not on file  ? ?Social Determinants of Health  ? ?Financial Resource Strain: Not on file  ?Food Insecurity: Not on file  ?Transportation Needs: Not on file  ?Physical Activity: Not on file  ?Stress: Not on file  ?Social Connections: Not on file  ?Intimate Partner Violence: Not on file  ? ? ?FAMILY HISTORY: ?Family History  ?Problem  Relation Age of Onset  ? Hypertension Mother   ? Osteoporosis Mother   ? Heart attack Father   ? Diabetes Brother   ? Breast cancer Neg Hx   ? ? ?ALLERGIES:  is allergic to codeine, latex, adhesive [tape], and penicillins. ? ?MEDICATIONS:  ?Current Outpatient Medications  ?Medication Sig Dispense Refill  ? aspirin 81 MG EC tablet Take 81 mg by mouth daily.    ? chlorthalidone (HYGROTON) 25 MG tablet Take 25 mg by mouth daily.   0  ? Cholecalciferol (VITAMIN D3) 5000 units CAPS Take 1,000 Units by mouth daily.     ? clonazePAM (KLONOPIN) 0.5 MG tablet Take 0.5 mg by mouth daily.    ? clopidogrel (PLAVIX) 75 MG tablet TAKE 1 TABLET BY MOUTH EVERY DAY 90 tablet 3  ? DULoxetine (CYMBALTA) 60 MG capsule Take 60 mg by mouth daily.    ? estradiol (ESTRACE) 0.5 MG tablet Take 0.5 mg by mouth every morning.  0  ? fluticasone (FLONASE) 50 MCG/ACT nasal spray Place 1 spray into both nostrils daily as needed for allergies or rhinitis.   0  ? Melatonin 10 MG TABS Take 10 mg by mouth at bedtime.    ?  ondansetron (ZOFRAN) 4 MG tablet Take 1 tablet (4 mg total) by mouth every 6 (six) hours as needed for nausea. 20 tablet 0  ? pregabalin (LYRICA) 50 MG capsule Take by mouth. Take 1 capsule (50 mg total) by mouth 2 (two) times daily for 14 days, THEN 2 capsules (100 mg total) 2 (two) times daily for 16 days.    ? REPATHA SURECLICK 140 MG/ML SOAJ Inject into the skin every 14 (fourteen) days.    ? rosuvastatin (CRESTOR) 10 MG tablet Take 10 mg by mouth daily.    ? valsartan (DIOVAN) 80 MG tablet Take 80 mg by mouth every morning.    ? ?No current facility-administered medications for this visit.  ? ? ? ?PHYSICAL EXAMINATION: ?ECOG PERFORMANCE STATUS: 0 - Asymptomatic ?Vitals:  ? 09/26/21 1023  ?BP: (!) 146/60  ?Resp: 18  ?Temp: (!) 96.6 ?F (35.9 ?C)  ? ?Filed Weights  ? 09/26/21 1023  ?Weight: 151 lb 12.8 oz (68.9 kg)  ? ? ?Physical Exam ?Constitutional:   ?   General: She is not in acute distress. ?HENT:  ?   Head: Normocephalic and  atraumatic.  ?Eyes:  ?   General: No scleral icterus. ?   Pupils: Pupils are equal, round, and reactive to light.  ?Cardiovascular:  ?   Rate and Rhythm: Normal rate and regular rhythm.  ?   Heart sounds: Normal

## 2021-09-26 NOTE — Progress Notes (Signed)
Patient here for follow up. Pt reports that she has been having occasion projectile vomiting episodes since January. Immunization record updated per pt request.  ?

## 2021-09-27 DIAGNOSIS — M47812 Spondylosis without myelopathy or radiculopathy, cervical region: Secondary | ICD-10-CM | POA: Diagnosis not present

## 2021-10-05 ENCOUNTER — Other Ambulatory Visit: Payer: Self-pay | Admitting: Nurse Practitioner

## 2021-10-05 DIAGNOSIS — R1012 Left upper quadrant pain: Secondary | ICD-10-CM

## 2021-10-05 DIAGNOSIS — R1112 Projectile vomiting: Secondary | ICD-10-CM

## 2021-10-05 DIAGNOSIS — R198 Other specified symptoms and signs involving the digestive system and abdomen: Secondary | ICD-10-CM | POA: Insufficient documentation

## 2021-10-05 DIAGNOSIS — R112 Nausea with vomiting, unspecified: Secondary | ICD-10-CM

## 2021-10-05 DIAGNOSIS — R194 Change in bowel habit: Secondary | ICD-10-CM | POA: Insufficient documentation

## 2021-10-05 DIAGNOSIS — F17209 Nicotine dependence, unspecified, with unspecified nicotine-induced disorders: Secondary | ICD-10-CM | POA: Diagnosis not present

## 2021-10-18 ENCOUNTER — Encounter (INDEPENDENT_AMBULATORY_CARE_PROVIDER_SITE_OTHER): Payer: Self-pay | Admitting: Vascular Surgery

## 2021-10-18 ENCOUNTER — Ambulatory Visit (INDEPENDENT_AMBULATORY_CARE_PROVIDER_SITE_OTHER): Payer: PPO

## 2021-10-18 ENCOUNTER — Ambulatory Visit (INDEPENDENT_AMBULATORY_CARE_PROVIDER_SITE_OTHER): Payer: PPO | Admitting: Vascular Surgery

## 2021-10-18 VITALS — BP 163/72 | HR 69 | Resp 16 | Ht 65.0 in | Wt 153.0 lb

## 2021-10-18 DIAGNOSIS — R1012 Left upper quadrant pain: Secondary | ICD-10-CM

## 2021-10-18 DIAGNOSIS — I10 Essential (primary) hypertension: Secondary | ICD-10-CM | POA: Diagnosis not present

## 2021-10-18 DIAGNOSIS — I70211 Atherosclerosis of native arteries of extremities with intermittent claudication, right leg: Secondary | ICD-10-CM

## 2021-10-18 DIAGNOSIS — I6523 Occlusion and stenosis of bilateral carotid arteries: Secondary | ICD-10-CM | POA: Diagnosis not present

## 2021-10-18 DIAGNOSIS — E785 Hyperlipidemia, unspecified: Secondary | ICD-10-CM

## 2021-10-18 NOTE — Assessment & Plan Note (Signed)
ABIs today are 0.70 on the right and 0.94 on the left which are slightly improved from previous studies.  Claudication symptoms are stable and not progressing.  Continue current medical regimen and vigorous exercise.  Recheck in 6 months. ?

## 2021-10-18 NOTE — Progress Notes (Signed)
? ? ?MRN : [id number] ? ?Bethany MorelLaura E Mcquinn is a 71 y.o. (02/24/1951) female who presents with chief complaint of  ?Chief Complaint  ?Patient presents with  ? Follow-up  ?  ultrasound  ?. ? ?History of Present Illness: Patient returns today in follow up of multiple vascular issues.  Currently, her major issue is of recurrent projectile vomiting, diarrhea, and she has had an extensive work-up that has been somewhat unrevealing.  She has seen GI and has an endoscopy scheduled for this summer.  She was prescribed omeprazole, but has not noticed any improvement with this and is not really having typical reflux symptoms and is concerned about the interaction with Plavix which is a real interaction.  She has not had unintentional weight loss, but does get bloating and pain.  This has been a progressive problem over several months and she does have an extensive vascular history.  She has an uninfused CT scan of the abdomen that was done for evaluation for kidney stones and has fairly extensive aortic atherosclerosis.  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 ?She also has other chronic vascular issues which we follow.  She has a history of carotid stenosis with carotid endarterectomy and then subsequent right carotid stenting for a fairly early recurrence.  This was about 5 years ago.  No current focal neurologic symptoms.  Her duplex today shows a patent right carotid artery stent with 1 to 39% left ICA stenosis without progression from previous studies. ?She is also followed for peripheral arterial disease.  She has stable claudication symptoms which are moderate in nature and not currently disabling.  She denies any rest pain or ulceration. ABIs today are 0.70 on the right and 0.94 on the left which are slightly improved from previous studies. ? ?Current Outpatient Medications   ?Medication Sig Dispense Refill  ? aspirin 81 MG EC tablet Take 81 mg by mouth daily.    ? chlorthalidone (HYGROTON) 25 MG tablet Take 25 mg by mouth daily.   0  ? Cholecalciferol (VITAMIN D3) 5000 units CAPS Take 1,000 Units by mouth daily.     ? clonazePAM (KLONOPIN) 0.5 MG tablet Take 0.5 mg by mouth daily.    ? clopidogrel (PLAVIX) 75 MG tablet TAKE 1 TABLET BY MOUTH EVERY DAY 90 tablet 3  ? DULoxetine (CYMBALTA) 60 MG capsule Take 60 mg by mouth daily.    ? estradiol (ESTRACE) 0.5 MG tablet Take 0.5 mg by mouth every morning.  0  ? fluticasone (FLONASE) 50 MCG/ACT nasal spray Place 1 spray into both nostrils daily as needed for allergies or rhinitis.   0  ? Melatonin 10 MG TABS Take 10 mg by mouth at bedtime.    ? ondansetron (ZOFRAN) 4 MG tablet Take 1 tablet (4 mg total) by mouth every 6 (six) hours as needed for nausea. 20 tablet 0  ? pantoprazole (PROTONIX) 40 MG tablet Take 40 mg by mouth daily.    ? REPATHA SURECLICK 140 MG/ML SOAJ Inject into the skin every 14 (fourteen) days.    ? rosuvastatin (CRESTOR) 10 MG tablet Take 10 mg by mouth daily.    ? valsartan (DIOVAN) 80 MG tablet Take 80 mg by mouth every morning.    ? pregabalin (LYRICA) 50 MG capsule Take by mouth. Take 1 capsule (50 mg total) by mouth 2 (two) times daily  for 14 days, THEN 2 capsules (100 mg total) 2 (two) times daily for 16 days.    ? ?No current facility-administered medications for this visit.  ? ? ?Past Medical History:  ?Diagnosis Date  ? Anxiety   ? Depression   ? Heart murmur   ? Hemorrhoids   ? Hyperlipidemia   ? Peripheral vascular disease (HCC)   ? Sinusitis   ? Tobacco use   ? ? ?Past Surgical History:  ?Procedure Laterality Date  ? ABDOMINAL HYSTERECTOMY  1991  ? APPENDECTOMY    ? BICEPT TENODESIS Left 10/08/2017  ? Procedure: BICEPS TENODESIS;  Surgeon: Signa Kell, MD;  Location: [facility];  Service: Orthopedics;  Laterality: Left;  ? CARDIAC CATHETERIZATION N/A 11/08/2015  ? Procedure: Left Heart Cath and Coronary  Angiography;  Surgeon: Laurier Nancy, MD;  Location: [facility];  Service: Cardiovascular;  Laterality: N/A;  ? CARDIAC CATHETERIZATION    ? Mynx placed in right artery after heart catherization  ? CAROTID STENT INSERTION Right 05/15/2016  ? COLONOSCOPY  2014  ? ENDARTERECTOMY Right 11/25/2015  ? Procedure: ENDARTERECTOMY CAROTID;  Surgeon: Annice Needy, MD;  Location: [facility];  Service: Vascular;  Laterality: Right;  ? FISSURECTOMY  10/22/14  ? HEMORRHOIDECTOMY WITH HEMORRHOID BANDING  1991,06/20/14, 10/22/14  ? PERIPHERAL VASCULAR CATHETERIZATION Right 05/15/2016  ? Procedure: Carotid PTA/Stent Intervention;  Surgeon: Annice Needy, MD;  Location: [facility];  Service: Cardiovascular;  Laterality: Right;  ? RESECTION DISTAL CLAVICAL  10/08/2017  ? Procedure: RESECTION DISTAL CLAVICAL;  Surgeon: Signa Kell, MD;  Location: [facility];  Service: Orthopedics;;  ? SHOULDER ARTHROSCOPY WITH SUBACROMIAL DECOMPRESSION Left 10/08/2017  ? Procedure: SHOULDER ARTHROSCOPY WITH SUBACROMIAL DECOMPRESSION;  Surgeon: Signa Kell, MD;  Location: [facility];  Service: Orthopedics;  Laterality: Left;  ? SHOULDER OPEN ROTATOR CUFF REPAIR Left 10/08/2017  ? Procedure: ROTATOR CUFF REPAIR SHOULDER OPEN Extensive Glenoid Humeral debridement;  Surgeon: Signa Kell, MD;  Location: [facility];  Service: Orthopedics;  Laterality: Left;  ? TUBAL LIGATION  1977  ? ? ? ?Social History  ? ?Tobacco Use  ? Smoking status: Every Day  ?  Packs/day: 1.00  ?  Years: 45.00  ?  Pack years: 45.00  ?  Types: Cigarettes  ? Smokeless tobacco: Never  ? Tobacco comments:  ?  Pt reports not ready to quit after education provided.  ?Vaping Use  ? Vaping Use: Never used  ?Substance Use Topics  ? Alcohol use: Not Currently  ?  Alcohol/week: 0.0 standard drinks  ? Drug use: No  ? ?  ? ? ?Family History  ?Problem Relation Age of Onset  ? Hypertension Mother   ? Osteoporosis Mother   ? Heart attack Father   ? Diabetes Brother   ? Breast cancer Neg Hx    ? ? ? ?Allergies  ?Allergen Reactions  ? Codeine Nausea Only  ? Latex   ?  Other reaction(s): Other (See Comments)  ? Adhesive [Tape] Itching and Rash  ? Penicillins Rash  ?  Tolerates ampicillin  ?Has patient had a PCN reaction causing immediate rash, facial/tongue/throat swelling, SOB or lightheadedness with hypotension: no ?Has patient had a PCN reaction causing severe rash involving mucus membranes or skin necrosis: no ?Has patient had a PCN reaction that required hospitalization {no ?Has patient had a PCN reaction occurring within the last 10 years: no ?If all of the above answers are "NO", then may proceed with Cephalosporin use.  ? ? ?REVIEW  OF SYSTEMS (Negative unless checked) ?  ?Constitutional: [] Weight loss  [] Fever  [] Chills ?Cardiac: [] Chest pain   [] Chest pressure   [] Palpitations   [] Shortness of breath when laying flat   [] Shortness of breath at rest   [] Shortness of breath with exertion. ?Vascular:  [x] Pain in legs with walking   [] Pain in legs at rest   [] Pain in legs when laying flat   [x] Claudication   [] Pain in feet when walking  [] Pain in feet at rest  [] Pain in feet when laying flat   [] History of DVT   [] Phlebitis   [] Swelling in legs   [] Varicose veins   [] Non-healing ulcers ?Pulmonary:   [] Uses home oxygen   [] Productive cough   [] Hemoptysis   [] Wheeze  [] COPD   [] Asthma ?Neurologic:  [x] Dizziness  [] Blackouts   [] Seizures   [] History of stroke   [] History of TIA  [] Aphasia   [x] Temporary blindness   [] Dysphagia   [] Weakness or numbness in arms   [x] Weakness or numbness in legs ?Musculoskeletal:  [] Arthritis   [] Joint swelling   [] Joint pain   [] Low back pain ?Hematologic:  [] Easy bruising  [] Easy bleeding   [] Hypercoagulable state   [] Anemic  [] Hepatitis ?Gastrointestinal:  [] Blood in stool   [] Vomiting blood  [] Gastroesophageal reflux/heartburn   [] Difficulty swallowing. ?Genitourinary:  [] Chronic kidney disease   [] Difficult urination  [] Frequent urination  [] Burning with urination    [] Blood in urine ?Skin:  [] Rashes   [] Ulcers   [] Wounds ?Psychological:  [x] History of anxiety   []  History of major depression ? ?Physical Examination ? ?BP (!) 163/72 (BP Location: Left Arm)   Pulse 69   Resp 16   Ht 5\' 5"  (

## 2021-10-18 NOTE — Assessment & Plan Note (Signed)
With her abdominal pain, diarrhea, and constipation issues with her extensive vascular history I certainly think a formal evaluation for chronic mesenteric ischemia is reasonable.  Her symptoms are not classic for this, but as we all know the symptoms can be variable with this difficult to diagnose entity.  She will return with a mesenteric duplex at her convenience. ?

## 2021-10-18 NOTE — Assessment & Plan Note (Signed)
Her duplex today shows a patent right carotid artery stent with 1 to 39% left ICA stenosis without progression from previous studies.  She will continue her current medical regimen.  We discussed the addition of omeprazole can decrease the efficacy of the clopidogrel, and until it is clear that she needs this she and I discussed that holding on the omeprazole is a very reasonable option.  Her GI work-up is forthcoming.  Recheck carotid duplex on 38-month intervals given her recurrent history in the past ?

## 2021-10-19 ENCOUNTER — Ambulatory Visit
Admission: RE | Admit: 2021-10-19 | Discharge: 2021-10-19 | Disposition: A | Discharge status: Home / Self Care | Payer: PPO | Source: Ambulatory Visit | Attending: Nurse Practitioner | Admitting: Nurse Practitioner

## 2021-10-19 ENCOUNTER — Encounter
Admission: RE | Admit: 2021-10-19 | Discharge: 2021-10-19 | Disposition: A | Discharge status: Home / Self Care | Payer: PPO | Source: Ambulatory Visit | Attending: Nurse Practitioner | Admitting: Nurse Practitioner

## 2021-10-19 DIAGNOSIS — R1112 Projectile vomiting: Secondary | ICD-10-CM | POA: Insufficient documentation

## 2021-10-19 DIAGNOSIS — R1012 Left upper quadrant pain: Secondary | ICD-10-CM | POA: Insufficient documentation

## 2021-10-19 DIAGNOSIS — R112 Nausea with vomiting, unspecified: Secondary | ICD-10-CM | POA: Insufficient documentation

## 2021-10-19 MED ORDER — TECHNETIUM TC 99M MEBROFENIN IV KIT
5.1800 | PACK | Freq: Once | INTRAVENOUS | Status: AC | PRN
  Administered 2021-10-19: 5.18 via INTRAVENOUS

## 2021-11-04 ENCOUNTER — Encounter (INDEPENDENT_AMBULATORY_CARE_PROVIDER_SITE_OTHER): Payer: PPO

## 2021-11-04 ENCOUNTER — Ambulatory Visit (INDEPENDENT_AMBULATORY_CARE_PROVIDER_SITE_OTHER): Payer: PPO

## 2021-11-04 DIAGNOSIS — R112 Nausea with vomiting, unspecified: Secondary | ICD-10-CM | POA: Diagnosis not present

## 2021-11-04 DIAGNOSIS — R7301 Impaired fasting glucose: Secondary | ICD-10-CM | POA: Diagnosis not present

## 2021-11-04 DIAGNOSIS — E785 Hyperlipidemia, unspecified: Secondary | ICD-10-CM | POA: Diagnosis not present

## 2021-11-04 DIAGNOSIS — K5900 Constipation, unspecified: Secondary | ICD-10-CM | POA: Diagnosis not present

## 2021-11-04 DIAGNOSIS — F17209 Nicotine dependence, unspecified, with unspecified nicotine-induced disorders: Secondary | ICD-10-CM | POA: Diagnosis not present

## 2021-11-04 DIAGNOSIS — E749 Disorder of carbohydrate metabolism, unspecified: Secondary | ICD-10-CM | POA: Diagnosis not present

## 2021-11-04 DIAGNOSIS — R1012 Left upper quadrant pain: Secondary | ICD-10-CM | POA: Diagnosis not present

## 2021-11-08 DIAGNOSIS — M13869 Other specified arthritis, unspecified knee: Secondary | ICD-10-CM | POA: Diagnosis not present

## 2021-11-08 DIAGNOSIS — F17200 Nicotine dependence, unspecified, uncomplicated: Secondary | ICD-10-CM | POA: Diagnosis not present

## 2021-11-08 DIAGNOSIS — F418 Other specified anxiety disorders: Secondary | ICD-10-CM | POA: Diagnosis not present

## 2021-11-08 DIAGNOSIS — I10 Essential (primary) hypertension: Secondary | ICD-10-CM | POA: Diagnosis not present

## 2021-11-08 DIAGNOSIS — G2581 Restless legs syndrome: Secondary | ICD-10-CM | POA: Diagnosis not present

## 2021-11-08 DIAGNOSIS — E785 Hyperlipidemia, unspecified: Secondary | ICD-10-CM | POA: Diagnosis not present

## 2021-11-08 DIAGNOSIS — J329 Chronic sinusitis, unspecified: Secondary | ICD-10-CM | POA: Diagnosis not present

## 2021-11-08 DIAGNOSIS — E749 Disorder of carbohydrate metabolism, unspecified: Secondary | ICD-10-CM | POA: Diagnosis not present

## 2021-11-08 DIAGNOSIS — G4709 Other insomnia: Secondary | ICD-10-CM | POA: Diagnosis not present

## 2021-11-08 DIAGNOSIS — N951 Menopausal and female climacteric states: Secondary | ICD-10-CM | POA: Diagnosis not present

## 2021-11-10 DIAGNOSIS — E785 Hyperlipidemia, unspecified: Secondary | ICD-10-CM | POA: Diagnosis not present

## 2021-11-10 DIAGNOSIS — I340 Nonrheumatic mitral (valve) insufficiency: Secondary | ICD-10-CM | POA: Diagnosis not present

## 2021-11-10 DIAGNOSIS — I351 Nonrheumatic aortic (valve) insufficiency: Secondary | ICD-10-CM | POA: Diagnosis not present

## 2021-11-10 DIAGNOSIS — I2510 Atherosclerotic heart disease of native coronary artery without angina pectoris: Secondary | ICD-10-CM | POA: Diagnosis not present

## 2021-11-10 DIAGNOSIS — I10 Essential (primary) hypertension: Secondary | ICD-10-CM | POA: Diagnosis not present

## 2021-11-25 ENCOUNTER — Other Ambulatory Visit: Payer: Self-pay | Admitting: Internal Medicine

## 2021-11-25 DIAGNOSIS — Z1231 Encounter for screening mammogram for malignant neoplasm of breast: Secondary | ICD-10-CM

## 2021-12-09 ENCOUNTER — Ambulatory Visit
Admission: RE | Admit: 2021-12-09 | Discharge: 2021-12-09 | Disposition: A | Discharge status: Home / Self Care | Payer: PPO | Source: Ambulatory Visit | Attending: Internal Medicine | Admitting: Internal Medicine

## 2021-12-09 DIAGNOSIS — Z1231 Encounter for screening mammogram for malignant neoplasm of breast: Secondary | ICD-10-CM | POA: Diagnosis not present

## 2021-12-20 ENCOUNTER — Telehealth (INDEPENDENT_AMBULATORY_CARE_PROVIDER_SITE_OTHER): Payer: Self-pay | Admitting: Vascular Surgery

## 2021-12-20 DIAGNOSIS — F17200 Nicotine dependence, unspecified, uncomplicated: Secondary | ICD-10-CM | POA: Diagnosis not present

## 2021-12-20 DIAGNOSIS — I10 Essential (primary) hypertension: Secondary | ICD-10-CM | POA: Diagnosis not present

## 2021-12-20 DIAGNOSIS — E785 Hyperlipidemia, unspecified: Secondary | ICD-10-CM | POA: Diagnosis not present

## 2021-12-20 DIAGNOSIS — E749 Disorder of carbohydrate metabolism, unspecified: Secondary | ICD-10-CM | POA: Diagnosis not present

## 2021-12-20 DIAGNOSIS — N951 Menopausal and female climacteric states: Secondary | ICD-10-CM | POA: Diagnosis not present

## 2021-12-20 DIAGNOSIS — G4709 Other insomnia: Secondary | ICD-10-CM | POA: Diagnosis not present

## 2021-12-20 DIAGNOSIS — G2581 Restless legs syndrome: Secondary | ICD-10-CM | POA: Diagnosis not present

## 2021-12-20 DIAGNOSIS — M13869 Other specified arthritis, unspecified knee: Secondary | ICD-10-CM | POA: Diagnosis not present

## 2021-12-20 DIAGNOSIS — J329 Chronic sinusitis, unspecified: Secondary | ICD-10-CM | POA: Diagnosis not present

## 2021-12-20 DIAGNOSIS — F418 Other specified anxiety disorders: Secondary | ICD-10-CM | POA: Diagnosis not present

## 2021-12-20 NOTE — Telephone Encounter (Signed)
Patient was made aware that anticoagulant clearance has been faxed and she can stop taking the blood thinners for procedure

## 2021-12-20 NOTE — Telephone Encounter (Signed)
Patient called in stating she had LVM yesterday but needs to know if she is ok to stop her Plavix for colonoscopy on Tuesday 6.27.23. Pt states that she needs to be off of it for 5 days prior. She would also like to know if she needs to stop her 81 mg Asprin? Please advise pt @ [phone]

## 2021-12-27 ENCOUNTER — Ambulatory Visit
Admission: RE | Admit: 2021-12-27 | Discharge: 2021-12-27 | Disposition: A | Discharge status: Home / Self Care | Payer: PPO | Attending: Gastroenterology | Admitting: Gastroenterology

## 2021-12-27 ENCOUNTER — Ambulatory Visit: Payer: PPO | Admitting: Anesthesiology

## 2021-12-27 ENCOUNTER — Other Ambulatory Visit: Payer: Self-pay

## 2021-12-27 ENCOUNTER — Encounter
Admission: RE | Disposition: A | Discharge status: Home / Self Care | Payer: Self-pay | Source: Home / Self Care | Attending: Gastroenterology

## 2021-12-27 ENCOUNTER — Encounter: Payer: Self-pay | Admitting: *Deleted

## 2021-12-27 DIAGNOSIS — K2960 Other gastritis without bleeding: Secondary | ICD-10-CM | POA: Diagnosis not present

## 2021-12-27 DIAGNOSIS — I10 Essential (primary) hypertension: Secondary | ICD-10-CM | POA: Diagnosis not present

## 2021-12-27 DIAGNOSIS — K6389 Other specified diseases of intestine: Secondary | ICD-10-CM | POA: Diagnosis not present

## 2021-12-27 DIAGNOSIS — K2950 Unspecified chronic gastritis without bleeding: Secondary | ICD-10-CM | POA: Diagnosis not present

## 2021-12-27 DIAGNOSIS — E785 Hyperlipidemia, unspecified: Secondary | ICD-10-CM | POA: Diagnosis not present

## 2021-12-27 DIAGNOSIS — F17200 Nicotine dependence, unspecified, uncomplicated: Secondary | ICD-10-CM | POA: Diagnosis not present

## 2021-12-27 DIAGNOSIS — Z79899 Other long term (current) drug therapy: Secondary | ICD-10-CM | POA: Diagnosis not present

## 2021-12-27 DIAGNOSIS — R194 Change in bowel habit: Secondary | ICD-10-CM | POA: Insufficient documentation

## 2021-12-27 DIAGNOSIS — F32A Depression, unspecified: Secondary | ICD-10-CM | POA: Diagnosis not present

## 2021-12-27 DIAGNOSIS — F419 Anxiety disorder, unspecified: Secondary | ICD-10-CM | POA: Insufficient documentation

## 2021-12-27 DIAGNOSIS — K635 Polyp of colon: Secondary | ICD-10-CM | POA: Diagnosis not present

## 2021-12-27 DIAGNOSIS — I739 Peripheral vascular disease, unspecified: Secondary | ICD-10-CM | POA: Diagnosis not present

## 2021-12-27 DIAGNOSIS — R112 Nausea with vomiting, unspecified: Secondary | ICD-10-CM | POA: Insufficient documentation

## 2021-12-27 DIAGNOSIS — K5900 Constipation, unspecified: Secondary | ICD-10-CM | POA: Diagnosis not present

## 2021-12-27 HISTORY — PX: COLONOSCOPY: SHX5424

## 2021-12-27 HISTORY — PX: ESOPHAGOGASTRODUODENOSCOPY: SHX5428

## 2021-12-27 HISTORY — DX: Gastro-esophageal reflux disease without esophagitis: K21.9

## 2021-12-27 SURGERY — COLONOSCOPY
Anesthesia: General

## 2021-12-27 MED ORDER — PROPOFOL 10 MG/ML IV BOLUS
INTRAVENOUS | Status: AC
  Filled 2021-12-27: qty 20

## 2021-12-27 MED ORDER — LIDOCAINE HCL (PF) 2 % IJ SOLN
INTRAMUSCULAR | Status: AC
Start: 2021-12-27 — End: ?
  Filled 2021-12-27: qty 5

## 2021-12-27 MED ORDER — SIMETHICONE 40 MG/0.6ML PO SUSP
ORAL | Status: DC | PRN
  Administered 2021-12-27: 100 mL

## 2021-12-27 MED ORDER — PROPOFOL 10 MG/ML IV BOLUS
INTRAVENOUS | Status: DC | PRN
  Administered 2021-12-27: 140 ug/kg/min via INTRAVENOUS
  Administered 2021-12-27: 100 mg via INTRAVENOUS

## 2021-12-27 MED ORDER — SODIUM CHLORIDE 0.9 % IV SOLN: INTRAVENOUS | Status: DC

## 2021-12-27 MED ORDER — LIDOCAINE HCL (CARDIAC) PF 100 MG/5ML IV SOSY
PREFILLED_SYRINGE | INTRAVENOUS | Status: DC | PRN
  Administered 2021-12-27: 100 mg via INTRAVENOUS

## 2021-12-27 NOTE — H&P (Signed)
Outpatient short stay form Pre-procedure 12/27/2021  Regis Bill, MD  Primary Physician: Sherron Monday, MD  Reason for visit:  Nausea/Vomiting/Change in bowel habits  History of present illness:    71 y/o lady with history of tobacco abuse, PVD on plavix with last dose 5 days ago, and HLD here for EGD/Colonoscopy for nausea/vomiting/change in bowel habits/constipation. Last colonoscopy in 2014 just with hemorrhoids. History of hysterectomy. No family history of GI malignancies.    Current Facility-Administered Medications:    0.9 %  sodium chloride infusion, , Intravenous, Continuous, Wah Sabic, Rossie Muskrat, MD, Last Rate: 20 mL/hr at 12/27/21 0941, New Bag at 12/27/21 0941  Medications Prior to Admission  Medication Sig Dispense Refill Last Dose   aspirin 81 MG EC tablet Take 81 mg by mouth daily.   12/21/2021   chlorthalidone (HYGROTON) 25 MG tablet Take 25 mg by mouth daily.   0 12/26/2021   Cholecalciferol (VITAMIN D3) 5000 units CAPS Take 1,000 Units by mouth daily.    Past Week   clonazePAM (KLONOPIN) 0.5 MG tablet Take 0.5 mg by mouth daily.   12/26/2021   clopidogrel (PLAVIX) 75 MG tablet TAKE 1 TABLET BY MOUTH EVERY DAY 90 tablet 3 12/21/2021   DULoxetine (CYMBALTA) 60 MG capsule Take 60 mg by mouth daily.   12/26/2021   fluticasone (FLONASE) 50 MCG/ACT nasal spray Place 1 spray into both nostrils daily as needed for allergies or rhinitis.   0 Past Week   Melatonin 10 MG TABS Take 10 mg by mouth at bedtime.   Past Week   ondansetron (ZOFRAN) 4 MG tablet Take 1 tablet (4 mg total) by mouth every 6 (six) hours as needed for nausea. 20 tablet 0 Past Week   pantoprazole (PROTONIX) 40 MG tablet Take 40 mg by mouth daily.   Past Week   REPATHA SURECLICK 140 MG/ML SOAJ Inject into the skin every 14 (fourteen) days.   Past Week   rosuvastatin (CRESTOR) 10 MG tablet Take 10 mg by mouth daily.   12/26/2021   valsartan (DIOVAN) 80 MG tablet Take 80 mg by mouth every morning.    12/26/2021   estradiol (ESTRACE) 0.5 MG tablet Take 0.5 mg by mouth every morning.  0    pregabalin (LYRICA) 50 MG capsule Take by mouth. Take 1 capsule (50 mg total) by mouth 2 (two) times daily for 14 days, THEN 2 capsules (100 mg total) 2 (two) times daily for 16 days.        Allergies  Allergen Reactions   Codeine Nausea Only   Latex     Other reaction(s): Other (See Comments)   Adhesive [Tape] Itching and Rash   Penicillins Rash    Tolerates ampicillin  Has patient had a PCN reaction causing immediate rash, facial/tongue/throat swelling, SOB or lightheadedness with hypotension: no Has patient had a PCN reaction causing severe rash involving mucus membranes or skin necrosis: no Has patient had a PCN reaction that required hospitalization {no Has patient had a PCN reaction occurring within the last 10 years: no If all of the above answers are "NO", then may proceed with Cephalosporin use.     Past Medical History:  Diagnosis Date   Anxiety    Depression    GERD (gastroesophageal reflux disease)    Heart murmur    Hemorrhoids    Hyperlipidemia    Peripheral vascular disease (HCC)    Sinusitis    Tobacco use     Review of systems:  Otherwise negative.  Physical Exam  Gen: Alert, oriented. Appears stated age.  HEENT: PERRLA. Lungs: No respiratory distress CV: RRR Abd: soft, benign, no masses Ext: No edema    Planned procedures: Proceed with colonoscopy. The patient understands the nature of the planned procedure, indications, risks, alternatives and potential complications including but not limited to bleeding, infection, perforation, damage to internal organs and possible oversedation/side effects from anesthesia. The patient agrees and gives consent to proceed.  Please refer to procedure notes for findings, recommendations and patient disposition/instructions.     Regis Bill, MD [facility]

## 2021-12-28 ENCOUNTER — Encounter: Payer: Self-pay | Admitting: Gastroenterology

## 2021-12-29 LAB — SURGICAL PATHOLOGY

## 2022-01-08 ENCOUNTER — Other Ambulatory Visit (INDEPENDENT_AMBULATORY_CARE_PROVIDER_SITE_OTHER): Payer: Self-pay | Admitting: Vascular Surgery

## 2022-02-03 DIAGNOSIS — R7303 Prediabetes: Secondary | ICD-10-CM | POA: Diagnosis not present

## 2022-02-03 DIAGNOSIS — I10 Essential (primary) hypertension: Secondary | ICD-10-CM | POA: Diagnosis not present

## 2022-02-03 DIAGNOSIS — E785 Hyperlipidemia, unspecified: Secondary | ICD-10-CM | POA: Diagnosis not present

## 2022-02-07 DIAGNOSIS — G4709 Other insomnia: Secondary | ICD-10-CM | POA: Diagnosis not present

## 2022-02-07 DIAGNOSIS — E785 Hyperlipidemia, unspecified: Secondary | ICD-10-CM | POA: Diagnosis not present

## 2022-02-07 DIAGNOSIS — N951 Menopausal and female climacteric states: Secondary | ICD-10-CM | POA: Diagnosis not present

## 2022-02-07 DIAGNOSIS — F418 Other specified anxiety disorders: Secondary | ICD-10-CM | POA: Diagnosis not present

## 2022-02-07 DIAGNOSIS — F17210 Nicotine dependence, cigarettes, uncomplicated: Secondary | ICD-10-CM | POA: Diagnosis not present

## 2022-02-07 DIAGNOSIS — M13869 Other specified arthritis, unspecified knee: Secondary | ICD-10-CM | POA: Diagnosis not present

## 2022-02-07 DIAGNOSIS — Z1331 Encounter for screening for depression: Secondary | ICD-10-CM | POA: Diagnosis not present

## 2022-02-07 DIAGNOSIS — E749 Disorder of carbohydrate metabolism, unspecified: Secondary | ICD-10-CM | POA: Diagnosis not present

## 2022-02-07 DIAGNOSIS — Z0001 Encounter for general adult medical examination with abnormal findings: Secondary | ICD-10-CM | POA: Diagnosis not present

## 2022-02-07 DIAGNOSIS — G2581 Restless legs syndrome: Secondary | ICD-10-CM | POA: Diagnosis not present

## 2022-02-07 DIAGNOSIS — I10 Essential (primary) hypertension: Secondary | ICD-10-CM | POA: Diagnosis not present

## 2022-02-07 DIAGNOSIS — J329 Chronic sinusitis, unspecified: Secondary | ICD-10-CM | POA: Diagnosis not present

## 2022-02-10 ENCOUNTER — Other Ambulatory Visit: Payer: Self-pay

## 2022-02-10 DIAGNOSIS — Z122 Encounter for screening for malignant neoplasm of respiratory organs: Secondary | ICD-10-CM

## 2022-02-10 DIAGNOSIS — Z87891 Personal history of nicotine dependence: Secondary | ICD-10-CM

## 2022-02-10 DIAGNOSIS — F17200 Nicotine dependence, unspecified, uncomplicated: Secondary | ICD-10-CM

## 2022-02-20 ENCOUNTER — Ambulatory Visit
Admission: RE | Admit: 2022-02-20 | Discharge: 2022-02-20 | Disposition: A | Discharge status: Home / Self Care | Payer: PPO | Source: Ambulatory Visit | Attending: Acute Care | Admitting: Acute Care

## 2022-02-20 DIAGNOSIS — Z122 Encounter for screening for malignant neoplasm of respiratory organs: Secondary | ICD-10-CM

## 2022-02-20 DIAGNOSIS — F17200 Nicotine dependence, unspecified, uncomplicated: Secondary | ICD-10-CM

## 2022-02-20 DIAGNOSIS — Z87891 Personal history of nicotine dependence: Secondary | ICD-10-CM

## 2022-02-20 DIAGNOSIS — F17210 Nicotine dependence, cigarettes, uncomplicated: Secondary | ICD-10-CM | POA: Diagnosis not present

## 2022-02-22 ENCOUNTER — Other Ambulatory Visit: Payer: Self-pay

## 2022-02-22 DIAGNOSIS — Z87891 Personal history of nicotine dependence: Secondary | ICD-10-CM

## 2022-02-22 DIAGNOSIS — F17210 Nicotine dependence, cigarettes, uncomplicated: Secondary | ICD-10-CM

## 2022-02-22 DIAGNOSIS — Z122 Encounter for screening for malignant neoplasm of respiratory organs: Secondary | ICD-10-CM

## 2022-03-13 DIAGNOSIS — R0602 Shortness of breath: Secondary | ICD-10-CM | POA: Diagnosis not present

## 2022-03-13 DIAGNOSIS — I340 Nonrheumatic mitral (valve) insufficiency: Secondary | ICD-10-CM | POA: Diagnosis not present

## 2022-03-13 DIAGNOSIS — E785 Hyperlipidemia, unspecified: Secondary | ICD-10-CM | POA: Diagnosis not present

## 2022-03-13 DIAGNOSIS — I10 Essential (primary) hypertension: Secondary | ICD-10-CM | POA: Diagnosis not present

## 2022-03-13 DIAGNOSIS — I351 Nonrheumatic aortic (valve) insufficiency: Secondary | ICD-10-CM | POA: Diagnosis not present

## 2022-03-13 DIAGNOSIS — I2510 Atherosclerotic heart disease of native coronary artery without angina pectoris: Secondary | ICD-10-CM | POA: Diagnosis not present

## 2022-04-12 DIAGNOSIS — Z23 Encounter for immunization: Secondary | ICD-10-CM | POA: Diagnosis not present

## 2022-04-21 ENCOUNTER — Ambulatory Visit (INDEPENDENT_AMBULATORY_CARE_PROVIDER_SITE_OTHER): Payer: PPO

## 2022-04-21 ENCOUNTER — Ambulatory Visit (INDEPENDENT_AMBULATORY_CARE_PROVIDER_SITE_OTHER): Payer: PPO | Admitting: Vascular Surgery

## 2022-04-21 ENCOUNTER — Encounter (INDEPENDENT_AMBULATORY_CARE_PROVIDER_SITE_OTHER): Payer: Self-pay

## 2022-04-21 DIAGNOSIS — I6523 Occlusion and stenosis of bilateral carotid arteries: Secondary | ICD-10-CM | POA: Diagnosis not present

## 2022-04-21 DIAGNOSIS — I70211 Atherosclerosis of native arteries of extremities with intermittent claudication, right leg: Secondary | ICD-10-CM | POA: Diagnosis not present

## 2022-04-26 DIAGNOSIS — H2513 Age-related nuclear cataract, bilateral: Secondary | ICD-10-CM | POA: Diagnosis not present

## 2022-05-01 ENCOUNTER — Encounter (INDEPENDENT_AMBULATORY_CARE_PROVIDER_SITE_OTHER): Payer: Self-pay

## 2022-06-06 DIAGNOSIS — E785 Hyperlipidemia, unspecified: Secondary | ICD-10-CM | POA: Diagnosis not present

## 2022-06-06 DIAGNOSIS — E749 Disorder of carbohydrate metabolism, unspecified: Secondary | ICD-10-CM | POA: Diagnosis not present

## 2022-06-06 DIAGNOSIS — I10 Essential (primary) hypertension: Secondary | ICD-10-CM | POA: Diagnosis not present

## 2022-06-06 DIAGNOSIS — R7301 Impaired fasting glucose: Secondary | ICD-10-CM | POA: Diagnosis not present

## 2022-06-09 DIAGNOSIS — F418 Other specified anxiety disorders: Secondary | ICD-10-CM | POA: Diagnosis not present

## 2022-06-09 DIAGNOSIS — J329 Chronic sinusitis, unspecified: Secondary | ICD-10-CM | POA: Diagnosis not present

## 2022-06-09 DIAGNOSIS — N951 Menopausal and female climacteric states: Secondary | ICD-10-CM | POA: Diagnosis not present

## 2022-06-09 DIAGNOSIS — I10 Essential (primary) hypertension: Secondary | ICD-10-CM | POA: Diagnosis not present

## 2022-06-09 DIAGNOSIS — G2581 Restless legs syndrome: Secondary | ICD-10-CM | POA: Diagnosis not present

## 2022-06-09 DIAGNOSIS — G4709 Other insomnia: Secondary | ICD-10-CM | POA: Diagnosis not present

## 2022-06-09 DIAGNOSIS — M13869 Other specified arthritis, unspecified knee: Secondary | ICD-10-CM | POA: Diagnosis not present

## 2022-06-09 DIAGNOSIS — E785 Hyperlipidemia, unspecified: Secondary | ICD-10-CM | POA: Diagnosis not present

## 2022-06-09 DIAGNOSIS — F17200 Nicotine dependence, unspecified, uncomplicated: Secondary | ICD-10-CM | POA: Diagnosis not present

## 2022-06-09 DIAGNOSIS — E749 Disorder of carbohydrate metabolism, unspecified: Secondary | ICD-10-CM | POA: Diagnosis not present

## 2022-07-13 DIAGNOSIS — I340 Nonrheumatic mitral (valve) insufficiency: Secondary | ICD-10-CM | POA: Diagnosis not present

## 2022-07-13 DIAGNOSIS — I351 Nonrheumatic aortic (valve) insufficiency: Secondary | ICD-10-CM | POA: Diagnosis not present

## 2022-07-13 DIAGNOSIS — I7389 Other specified peripheral vascular diseases: Secondary | ICD-10-CM | POA: Diagnosis not present

## 2022-07-13 DIAGNOSIS — I10 Essential (primary) hypertension: Secondary | ICD-10-CM | POA: Diagnosis not present

## 2022-07-13 DIAGNOSIS — I2510 Atherosclerotic heart disease of native coronary artery without angina pectoris: Secondary | ICD-10-CM | POA: Diagnosis not present

## 2022-07-13 DIAGNOSIS — E785 Hyperlipidemia, unspecified: Secondary | ICD-10-CM | POA: Diagnosis not present

## 2022-09-05 ENCOUNTER — Other Ambulatory Visit: Payer: Self-pay | Admitting: Internal Medicine

## 2022-09-05 ENCOUNTER — Other Ambulatory Visit: Payer: Self-pay | Admitting: Cardiovascular Disease

## 2022-09-05 DIAGNOSIS — F418 Other specified anxiety disorders: Secondary | ICD-10-CM

## 2022-09-05 DIAGNOSIS — I2510 Atherosclerotic heart disease of native coronary artery without angina pectoris: Secondary | ICD-10-CM

## 2022-09-07 ENCOUNTER — Other Ambulatory Visit: Payer: Medicare HMO

## 2022-09-07 ENCOUNTER — Other Ambulatory Visit: Payer: Self-pay | Admitting: Internal Medicine

## 2022-09-07 DIAGNOSIS — E749 Disorder of carbohydrate metabolism, unspecified: Secondary | ICD-10-CM | POA: Diagnosis not present

## 2022-09-07 DIAGNOSIS — R7301 Impaired fasting glucose: Secondary | ICD-10-CM | POA: Diagnosis not present

## 2022-09-07 DIAGNOSIS — E785 Hyperlipidemia, unspecified: Secondary | ICD-10-CM | POA: Diagnosis not present

## 2022-09-08 LAB — LIPID PANEL W/O CHOL/HDL RATIO
Cholesterol, Total: 95 mg/dL — ABNORMAL LOW (ref 100–199)
HDL: 52 mg/dL (ref >39)
LDL Chol Calc (NIH): 18 mg/dL (ref 0–99)
Triglycerides: 148 mg/dL (ref 0–149)
VLDL Cholesterol Cal: 25 mg/dL (ref 5–40)

## 2022-09-08 LAB — COMPREHENSIVE METABOLIC PANEL
ALT: 27 IU/L (ref 0–32)
AST: 34 IU/L (ref 0–40)
Albumin/Globulin Ratio: 2 (ref 1.2–2.2)
Albumin: 4.5 g/dL (ref 3.9–4.9)
Alkaline Phosphatase: 80 IU/L (ref 44–121)
BUN/Creatinine Ratio: 9 — ABNORMAL LOW (ref 12–28)
BUN: 6 mg/dL — ABNORMAL LOW (ref 8–27)
Bilirubin Total: 0.3 mg/dL (ref 0.0–1.2)
CO2: 25 mmol/L (ref 20–29)
Calcium: 9.6 mg/dL (ref 8.7–10.3)
Chloride: 97 mmol/L (ref 96–106)
Creatinine, Ser: 0.65 mg/dL (ref 0.57–1.00)
Globulin, Total: 2.3 g/dL (ref 1.5–4.5)
Glucose: 102 mg/dL — ABNORMAL HIGH (ref 70–99)
Potassium: 4.1 mmol/L (ref 3.5–5.2)
Sodium: 138 mmol/L (ref 134–144)
Total Protein: 6.8 g/dL (ref 6.0–8.5)
eGFR: 95 mL/min/{1.73_m2} (ref >59)

## 2022-09-08 LAB — HGB A1C W/O EAG: Hgb A1c MFr Bld: 5.9 % — ABNORMAL HIGH (ref 4.8–5.6)

## 2022-09-11 ENCOUNTER — Encounter: Payer: Self-pay | Admitting: Internal Medicine

## 2022-09-11 ENCOUNTER — Ambulatory Visit (INDEPENDENT_AMBULATORY_CARE_PROVIDER_SITE_OTHER): Payer: Medicare HMO | Admitting: Internal Medicine

## 2022-09-11 VITALS — BP 120/64 | HR 59 | Ht 65.0 in | Wt 158.8 lb

## 2022-09-11 DIAGNOSIS — E782 Mixed hyperlipidemia: Secondary | ICD-10-CM | POA: Diagnosis not present

## 2022-09-11 DIAGNOSIS — I739 Peripheral vascular disease, unspecified: Secondary | ICD-10-CM | POA: Diagnosis not present

## 2022-09-11 DIAGNOSIS — I10 Essential (primary) hypertension: Secondary | ICD-10-CM

## 2022-09-11 DIAGNOSIS — R69 Illness, unspecified: Secondary | ICD-10-CM | POA: Diagnosis not present

## 2022-09-11 DIAGNOSIS — F17209 Nicotine dependence, unspecified, with unspecified nicotine-induced disorders: Secondary | ICD-10-CM

## 2022-09-11 MED ORDER — CHLORTHALIDONE 25 MG PO TABS: 25.0000 mg | ORAL_TABLET | Freq: Every day | ORAL | 1 refills | Status: DC

## 2022-09-11 NOTE — Progress Notes (Signed)
Established Patient Office Visit  Subjective:  Patient ID: Bethany Lozano, female    DOB: 1951-04-29  Age: 72 y.o. MRN: [id number]  Chief Complaint  Patient presents with   Follow-up    4 month follow up    No new complaints here for lab review and medication refills. Labs reviewed and notable for well controlled lipids, A1c in the prediabetic range with otherwise unremarkable CMP. Mood stable on current medications.     Past Medical History:  Diagnosis Date   Anxiety    Depression    GERD (gastroesophageal reflux disease)    Heart murmur    Hemorrhoids    Hyperlipidemia    Peripheral vascular disease ([place])    Sinusitis    Tobacco use     Past Surgical History:  Procedure Laterality Date   ABDOMINAL HYSTERECTOMY  1991   APPENDECTOMY     BICEPT TENODESIS Left 10/08/2017   Procedure: BICEPS TENODESIS;  Surgeon: Leim Fabry, MD;  Location: [facility];  Service: Orthopedics;  Laterality: Left;   CARDIAC CATHETERIZATION N/A 11/08/2015   Procedure: Left Heart Cath and Coronary Angiography;  Surgeon: Dionisio David, MD;  Location: [place] [facility];  Service: Cardiovascular;  Laterality: N/A;   CARDIAC CATHETERIZATION     Mynx placed in right artery after heart catherization   CAROTID STENT INSERTION Right 05/15/2016   COLONOSCOPY  2014   COLONOSCOPY N/A 12/27/2021   Procedure: COLONOSCOPY;  Surgeon: Lesly Rubenstein, MD;  Location: [facility];  Service: Endoscopy;  Laterality: N/A;   ENDARTERECTOMY Right 11/25/2015   Procedure: ENDARTERECTOMY CAROTID;  Surgeon: Algernon Huxley, MD;  Location: [facility];  Service: Vascular;  Laterality: Right;   ESOPHAGOGASTRODUODENOSCOPY N/A 12/27/2021   Procedure: ESOPHAGOGASTRODUODENOSCOPY (EGD);  Surgeon: Lesly Rubenstein, MD;  Location: [facility];  Service: Endoscopy;  Laterality: N/A;   FISSURECTOMY  10/22/14   HEMORRHOIDECTOMY WITH HEMORRHOID BANDING  1991,06/20/14, 10/22/14   PERIPHERAL VASCULAR CATHETERIZATION Right 05/15/2016    Procedure: Carotid PTA/Stent Intervention;  Surgeon: Algernon Huxley, MD;  Location: [place] [facility];  Service: Cardiovascular;  Laterality: Right;   RESECTION DISTAL CLAVICAL  10/08/2017   Procedure: RESECTION DISTAL CLAVICAL;  Surgeon: Leim Fabry, MD;  Location: [facility];  Service: Orthopedics;;   SHOULDER ARTHROSCOPY WITH SUBACROMIAL DECOMPRESSION Left 10/08/2017   Procedure: SHOULDER ARTHROSCOPY WITH SUBACROMIAL DECOMPRESSION;  Surgeon: Leim Fabry, MD;  Location: [facility];  Service: Orthopedics;  Laterality: Left;   SHOULDER OPEN ROTATOR CUFF REPAIR Left 10/08/2017   Procedure: ROTATOR CUFF REPAIR SHOULDER OPEN Extensive Glenoid Humeral debridement;  Surgeon: Leim Fabry, MD;  Location: [facility];  Service: Orthopedics;  Laterality: Left;   TUBAL LIGATION  1977    Social History   Socioeconomic History   Marital status: Single    Spouse name: Not on file   Number of children: Not on file   Years of education: Not on file   Highest education level: Not on file  Occupational History   Occupation: retired  Tobacco Use   Smoking status: Every Day    Packs/day: 1.00    Years: 45.00    Total pack years: 45.00    Types: Cigarettes   Smokeless tobacco: Never   Tobacco comments:    Pt reports not ready to quit after education provided.  Vaping Use   Vaping Use: Never used  Substance and Sexual Activity   Alcohol use: Not Currently    Alcohol/week: 0.0 standard drinks of alcohol   Drug use: No  Sexual activity: Not Currently    Birth control/protection: Post-menopausal  Other Topics Concern   Not on file  Social History Narrative   Not on file   Social Determinants of Health   Financial Resource Strain: Not on file  Food Insecurity: Not on file  Transportation Needs: Not on file  Physical Activity: Not on file  Stress: Not on file  Social Connections: Not on file  Intimate Partner Violence: Not on file    Family History  Problem Relation Age of Onset   Hypertension  Mother    Osteoporosis Mother    Heart attack Father    Diabetes Brother    Breast cancer Neg Hx     Allergies  Allergen Reactions   Codeine Nausea Only   Latex     Other reaction([place]): Other (See Comments)   Adhesive [Tape] Itching and Rash   Penicillins Rash    Tolerates ampicillin  Has patient had a PCN reaction causing immediate rash, facial/tongue/throat swelling, SOB or lightheadedness with hypotension: no Has patient had a PCN reaction causing severe rash involving mucus membranes or skin necrosis: no Has patient had a PCN reaction that required hospitalization {no Has patient had a PCN reaction occurring within the last 10 years: no If all of the above answers are "NO", then may proceed with Cephalosporin use.    Review of Systems  Constitutional: Negative.   HENT: Negative.    Eyes: Negative.   Respiratory: Negative.    Cardiovascular: Negative.   Gastrointestinal: Negative.   Genitourinary: Negative.   Skin: Negative.   Neurological: Negative.   Endo/Heme/Allergies: Negative.        Objective:   BP 120/64   Pulse (!) 59   Ht '5\' 5"'$  (1.651 m)   Wt 158 lb 12.8 oz (72 kg)   SpO2 98%   BMI 26.43 kg/m   Vitals:   09/11/22 1321 09/11/22 1353  BP: (!) 150/80 120/64  Pulse: (!) 59   Height: '5\' 5"'$  (1.651 m)   Weight: 158 lb 12.8 oz (72 kg)   SpO2: 98%   BMI (Calculated): 26.43     Physical Exam Vitals reviewed.  Constitutional:      General: She is not in acute distress. HENT:     Head: Normocephalic.     Nose: Nose normal.     Mouth/Throat:     Mouth: Mucous membranes are moist.  Eyes:     Extraocular Movements: Extraocular movements intact.     Pupils: Pupils are equal, round, and reactive to light.  Cardiovascular:     Rate and Rhythm: Normal rate and regular rhythm.     Heart sounds: Murmur heard.     Crescendo decrescendo systolic murmur is present with a grade of 3/6.  Pulmonary:     Effort: Pulmonary effort is normal.     Breath sounds:  No rhonchi or rales.  Abdominal:     General: Abdomen is flat.     Palpations: There is no hepatomegaly, splenomegaly or mass.  Musculoskeletal:        General: Normal range of motion.     Cervical back: Normal range of motion. No tenderness.  Skin:    General: Skin is warm and dry.  Neurological:     General: No focal deficit present.     Mental Status: She is alert and oriented to person, place, and time.     Cranial Nerves: No cranial nerve deficit.     Motor: No weakness.  Psychiatric:  Mood and Affect: Mood normal.        Behavior: Behavior normal.      No results found for any visits on 09/11/22.  Recent Results (from the past 2160 hour(Dhiya Smits))  Comprehensive metabolic panel     Status: Abnormal   Collection Time: 09/07/22  9:38 AM  Result Value Ref Range   Glucose 102 (H) 70 - 99 mg/dL   BUN 6 (L) 8 - 27 mg/dL   Creatinine, Ser 0.65 0.57 - 1.00 mg/dL   eGFR 95 >59 mL/min/1.73   BUN/Creatinine Ratio 9 (L) 12 - 28   Sodium 138 134 - 144 mmol/L   Potassium 4.1 3.5 - 5.2 mmol/L   Chloride 97 96 - 106 mmol/L   CO2 25 20 - 29 mmol/L   Calcium 9.6 8.7 - 10.3 mg/dL   Total Protein 6.8 6.0 - 8.5 g/dL   Albumin 4.5 3.9 - 4.9 g/dL   Globulin, Total 2.3 1.5 - 4.5 g/dL   Albumin/Globulin Ratio 2.0 1.2 - 2.2   Bilirubin Total 0.3 0.0 - 1.2 mg/dL   Alkaline Phosphatase 80 44 - 121 IU/L   AST 34 0 - 40 IU/L   ALT 27 0 - 32 IU/L  Lipid Panel w/o Chol/HDL Ratio     Status: Abnormal   Collection Time: 09/07/22  9:38 AM  Result Value Ref Range   Cholesterol, Total 95 (L) 100 - 199 mg/dL   Triglycerides 148 0 - 149 mg/dL   HDL 52 >39 mg/dL   VLDL Cholesterol Cal 25 5 - 40 mg/dL   LDL Chol Calc (NIH) 18 0 - 99 mg/dL  Hgb A1c w/o eAG     Status: Abnormal   Collection Time: 09/07/22  9:38 AM  Result Value Ref Range   Hgb A1c MFr Bld 5.9 (H) 4.8 - 5.6 %    Comment:          Prediabetes: 5.7 - 6.4          Diabetes: >6.4          Glycemic control for adults with diabetes:  <7.0       Assessment & Plan:   Problem List Items Addressed This Visit       Cardiovascular and Mediastinum   Essential hypertension   Relevant Medications   chlorthalidone (HYGROTON) 25 MG tablet   Peripheral vascular disease (HCC)   Relevant Medications   chlorthalidone (HYGROTON) 25 MG tablet     Other   Hyperlipidemia - Primary   Relevant Medications   chlorthalidone (HYGROTON) 25 MG tablet   Other Relevant Orders   Comprehensive metabolic panel   Lipid panel   Tobacco use disorder, continuous   1. Mixed hyperlipidemia - Comprehensive metabolic panel; Future - Lipid panel; Future  2. Tobacco use disorder, continuous  3. Peripheral vascular disease ([place])  4. Essential hypertension - chlorthalidone (HYGROTON) 25 MG tablet; Take 1 tablet (25 mg total) by mouth daily.  Dispense: 90 tablet; Refill: 1   No follow-ups on file.   Total time spent: 30 minutes  Volanda Napoleon, MD  09/11/2022

## 2022-09-14 ENCOUNTER — Other Ambulatory Visit: Payer: Self-pay

## 2022-09-14 DIAGNOSIS — I2510 Atherosclerotic heart disease of native coronary artery without angina pectoris: Secondary | ICD-10-CM

## 2022-10-11 ENCOUNTER — Other Ambulatory Visit (INDEPENDENT_AMBULATORY_CARE_PROVIDER_SITE_OTHER): Payer: Self-pay | Admitting: Vascular Surgery

## 2022-10-11 DIAGNOSIS — I6523 Occlusion and stenosis of bilateral carotid arteries: Secondary | ICD-10-CM

## 2022-10-11 DIAGNOSIS — I739 Peripheral vascular disease, unspecified: Secondary | ICD-10-CM

## 2022-10-20 ENCOUNTER — Encounter (INDEPENDENT_AMBULATORY_CARE_PROVIDER_SITE_OTHER): Payer: PPO

## 2022-10-20 ENCOUNTER — Ambulatory Visit (INDEPENDENT_AMBULATORY_CARE_PROVIDER_SITE_OTHER): Payer: Medicare HMO

## 2022-10-20 ENCOUNTER — Ambulatory Visit (INDEPENDENT_AMBULATORY_CARE_PROVIDER_SITE_OTHER): Payer: Medicare HMO | Admitting: Vascular Surgery

## 2022-10-20 DIAGNOSIS — I739 Peripheral vascular disease, unspecified: Secondary | ICD-10-CM

## 2022-10-20 DIAGNOSIS — I6523 Occlusion and stenosis of bilateral carotid arteries: Secondary | ICD-10-CM | POA: Diagnosis not present

## 2022-10-23 LAB — VAS US ABI WITH/WO TBI
Left ABI: 1.07
Right ABI: 0.83

## 2022-10-24 ENCOUNTER — Encounter (INDEPENDENT_AMBULATORY_CARE_PROVIDER_SITE_OTHER): Payer: Self-pay | Admitting: Vascular Surgery

## 2022-10-24 ENCOUNTER — Ambulatory Visit (INDEPENDENT_AMBULATORY_CARE_PROVIDER_SITE_OTHER): Payer: Medicare HMO | Admitting: Vascular Surgery

## 2022-10-24 VITALS — BP 142/72 | HR 71 | Resp 16 | Wt 160.8 lb

## 2022-10-24 DIAGNOSIS — E782 Mixed hyperlipidemia: Secondary | ICD-10-CM

## 2022-10-24 DIAGNOSIS — I70211 Atherosclerosis of native arteries of extremities with intermittent claudication, right leg: Secondary | ICD-10-CM | POA: Diagnosis not present

## 2022-10-24 DIAGNOSIS — I6523 Occlusion and stenosis of bilateral carotid arteries: Secondary | ICD-10-CM | POA: Diagnosis not present

## 2022-10-24 DIAGNOSIS — I10 Essential (primary) hypertension: Secondary | ICD-10-CM

## 2022-10-24 NOTE — Assessment & Plan Note (Signed)
Carotid duplex demonstrates a patent right carotid stent and 1 to 39% left ICA stenosis without progression from previous studies.  Doing well without any current symptoms.  Continue current medical regimen.  Recheck in 6 months.

## 2022-10-24 NOTE — Progress Notes (Signed)
MRN : [id number]  Bethany Lozano is a 72 y.o. ([birth year]) female who presents with chief complaint of  Chief Complaint  Patient presents with   Follow-up    Ultrasound results  .  History of Present Illness: Patient returns today in follow up of multiple vascular issues.  She had ultrasounds performed last week but I had to leave clinic for surgery and she returns today to go over the studies.  She is doing well.  She has some mild claudication symptoms on the right which have not progressed.  Her right ABI is slightly improved to 0.80 and her left ABI is stable at 1.07. She is also followed for carotid disease.  She has had no focal symptoms. Specifically, the patient denies amaurosis fugax, speech or swallowing difficulties, or arm or leg weakness or numbness. She had initial carotid endarterectomy and had recurrent stenosis treated with a carotid stent back in 2017. Carotid duplex demonstrates a patent right carotid stent and 1 to 39% left ICA stenosis without progression from previous studies.    Current Outpatient Medications  Medication Sig Dispense Refill   acetaminophen (TYLENOL) 500 MG tablet Take 500 mg by mouth every 6 (six) hours as needed for moderate pain.     aspirin 81 MG EC tablet Take 81 mg by mouth daily.     chlorthalidone (HYGROTON) 25 MG tablet Take 1 tablet (25 mg total) by mouth daily. 90 tablet 1   Cholecalciferol (VITAMIN D3) 5000 units CAPS Take 1,000 Units by mouth daily.      clonazePAM (KLONOPIN) 0.5 MG tablet Take 0.5 mg by mouth daily.     clopidogrel (PLAVIX) 75 MG tablet TAKE 1 TABLET BY MOUTH EVERY DAY 90 tablet 3   DULoxetine (CYMBALTA) 60 MG capsule TAKE 1 CAPSULE BY MOUTH EVERY DAY 90 capsule 1   estradiol (ESTRACE) 0.5 MG tablet Take 0.5 mg by mouth every morning.  0   Evolocumab (REPATHA SURECLICK) 140 MG/ML SOAJ INJECT 1 INJECTION EVERY 2 WEEKS 6 mL 0   fluticasone (FLONASE) 50 MCG/ACT nasal spray Place 1 spray into both nostrils daily as needed  for allergies or rhinitis.   0   Melatonin 10 MG TABS Take 10 mg by mouth at bedtime.     Multiple Vitamins-Minerals (ONE A DAY WOMEN 50 PLUS PO) Take 1 tablet by mouth 1 day or 1 dose.     ondansetron (ZOFRAN) 4 MG tablet Take 1 tablet (4 mg total) by mouth every 6 (six) hours as needed for nausea. 20 tablet 0   rosuvastatin (CRESTOR) 10 MG tablet Take 10 mg by mouth daily.     valsartan (DIOVAN) 80 MG tablet Take 80 mg by mouth every morning.     pantoprazole (PROTONIX) 40 MG tablet Take 40 mg by mouth daily.     pregabalin (LYRICA) 50 MG capsule Take by mouth. Take 1 capsule (50 mg total) by mouth 2 (two) times daily for 14 days, THEN 2 capsules (100 mg total) 2 (two) times daily for 16 days.     No current facility-administered medications for this visit.    Past Medical History:  Diagnosis Date   Anxiety    Depression    GERD (gastroesophageal reflux disease)    Heart murmur    Hemorrhoids    Hyperlipidemia    Peripheral vascular disease    Sinusitis    Tobacco use     Past Surgical History:  Procedure Laterality Date   ABDOMINAL HYSTERECTOMY  1991  APPENDECTOMY     BICEPT TENODESIS Left 10/08/2017   Procedure: BICEPS TENODESIS;  Surgeon: Signa Kell, MD;  Location: [facility];  Service: Orthopedics;  Laterality: Left;   CARDIAC CATHETERIZATION N/A 11/08/2015   Procedure: Left Heart Cath and Coronary Angiography;  Surgeon: Laurier Nancy, MD;  Location: [facility];  Service: Cardiovascular;  Laterality: N/A;   CARDIAC CATHETERIZATION     Mynx placed in right artery after heart catherization   CAROTID STENT INSERTION Right 05/15/2016   COLONOSCOPY  2014   COLONOSCOPY N/A 12/27/2021   Procedure: COLONOSCOPY;  Surgeon: Regis Bill, MD;  Location: [facility];  Service: Endoscopy;  Laterality: N/A;   ENDARTERECTOMY Right 11/25/2015   Procedure: ENDARTERECTOMY CAROTID;  Surgeon: Annice Needy, MD;  Location: [facility];  Service: Vascular;  Laterality: Right;    ESOPHAGOGASTRODUODENOSCOPY N/A 12/27/2021   Procedure: ESOPHAGOGASTRODUODENOSCOPY (EGD);  Surgeon: Regis Bill, MD;  Location: [facility] ENDOS[facility];  Service: Endoscopy;  Laterality: N/A;   FISSURECTOMY  10/22/14   HEMORRHOIDECTOMY WITH HEMORRHOID BANDING  1991,06/20/14, 10/22/14   PERIPHERAL VASCULAR CATHETERIZATION Right 05/15/2016   Procedure: Carotid PTA/Stent Intervention;  Surgeon: Annice Needy, MD;  Location: [facility];  Service: Cardiovascular;  Laterality: Right;   RESECTION DISTAL CLAVICAL  10/08/2017   Procedure: RESECTION DISTAL CLAVICAL;  Surgeon: Signa Kell, MD;  Location: [facility];  Service: Orthopedics;;   SHOULDER ARTHROSCOPY WITH SUBACROMIAL DECOMPRESSION Left 10/08/2017   Procedure: SHOULDER ARTHROSCOPY WITH SUBACROMIAL DECOMPRESSION;  Surgeon: Signa Kell, MD;  Location: [facility];  Service: Orthopedics;  Laterality: Left;   SHOULDER OPEN ROTATOR CUFF REPAIR Left 10/08/2017   Procedure: ROTATOR CUFF REPAIR SHOULDER OPEN Extensive Glenoid Humeral debridement;  Surgeon: Signa Kell, MD;  Location: [facility];  Service: Orthopedics;  Laterality: Left;   TUBAL LIGATION  1977     Social History   Tobacco Use   Smoking status: Every Day    Packs/day: 1.00    Years: 45.00    Additional pack years: 0.00    Total pack years: 45.00    Types: Cigarettes   Smokeless tobacco: Never   Tobacco comments:    Pt reports not ready to quit after education provided.  Vaping Use   Vaping Use: Never used  Substance Use Topics   Alcohol use: Not Currently    Alcohol/week: 0.0 standard drinks of alcohol   Drug use: No      Family History  Problem Relation Age of Onset   Hypertension Mother    Osteoporosis Mother    Heart attack Father    Diabetes Brother    Breast cancer Neg Hx      Allergies  Allergen Reactions   Codeine Nausea Only   Latex     Other reaction(s): Other (See Comments)   Adhesive [Tape] Itching and Rash   Penicillins Rash    Tolerates ampicillin   Has patient had a PCN reaction causing immediate rash, facial/tongue/throat swelling, SOB or lightheadedness with hypotension: no Has patient had a PCN reaction causing severe rash involving mucus membranes or skin necrosis: no Has patient had a PCN reaction that required hospitalization {no Has patient had a PCN reaction occurring within the last 10 years: no If all of the above answers are "NO", then may proceed with Cephalosporin use.    REVIEW OF SYSTEMS (Negative unless checked)   Constitutional: Weight loss  Fever  Chills Cardiac: Chest pain   Chest pressure   Palpitations   Shortness of breath when laying  flat   [] Shortness of breath at rest   [] Shortness of breath with exertion. Vascular:  [x] Pain in legs with walking   [] Pain in legs at rest   [] Pain in legs when laying flat   [x] Claudication   [] Pain in feet when walking  [] Pain in feet at rest  [] Pain in feet when laying flat   [] History of DVT   [] Phlebitis   [] Swelling in legs   [] Varicose veins   [] Non-healing ulcers Pulmonary:   [] Uses home oxygen   [] Productive cough   [] Hemoptysis   [] Wheeze  [] COPD   [] Asthma Neurologic:  [x] Dizziness  [] Blackouts   [] Seizures   [] History of stroke   [] History of TIA  [] Aphasia   [x] Temporary blindness   [] Dysphagia   [] Weakness or numbness in arms   [x] Weakness or numbness in legs Musculoskeletal:  [] Arthritis   [] Joint swelling   [] Joint pain   [] Low back pain Hematologic:  [] Easy bruising  [] Easy bleeding   [] Hypercoagulable state   [] Anemic  [] Hepatitis Gastrointestinal:  [] Blood in stool   [] Vomiting blood  [] Gastroesophageal reflux/heartburn   [] Difficulty swallowing. Genitourinary:  [] Chronic kidney disease   [] Difficult urination  [] Frequent urination  [] Burning with urination   [] Blood in urine Skin:  [] Rashes   [] Ulcers   [] Wounds Psychological:  [x] History of anxiety   []  History of major depression  Physical Examination  BP (!) 142/72 (BP Location: Left Arm)    Pulse 71   Resp 16   Wt 160 lb 12.8 oz (72.9 kg)   BMI 26.76 kg/m  Gen:  WD/WN, NAD Head: [place]/AT, No temporalis wasting. Ear/Nose/Throat: Hearing grossly intact, nares w/o erythema or drainage Eyes: Conjunctiva clear. Sclera non-icteric Neck: Supple.  Trachea midline Pulmonary:  Good air movement, no use of accessory muscles.  Cardiac: RRR, no JVD Vascular:  Vessel Right Left  Radial Palpable Palpable           Musculoskeletal: M/S 5/5 throughout.  No deformity or atrophy. No edema. Neurologic: Sensation grossly intact in extremities.  Symmetrical.  Speech is fluent.  Psychiatric: Judgment intact, Mood & affect appropriate for pt's clinical situation.     Labs Recent Results (from the past 2160 hour(s))  Comprehensive metabolic panel     Status: Abnormal   Collection Time: 09/07/22  9:38 AM  Result Value Ref Range   Glucose 102 (H) 70 - 99 mg/dL   BUN 6 (L) 8 - 27 mg/dL   Creatinine, Ser 1.61 0.57 - 1.00 mg/dL   eGFR 95 >09 UE/AVW/0.98   BUN/Creatinine Ratio 9 (L) 12 - 28   Sodium 138 134 - 144 mmol/L   Potassium 4.1 3.5 - 5.2 mmol/L   Chloride 97 96 - 106 mmol/L   CO2 25 20 - 29 mmol/L   Calcium 9.6 8.7 - 10.3 mg/dL   Total Protein 6.8 6.0 - 8.5 g/dL   Albumin 4.5 3.9 - 4.9 g/dL   Globulin, Total 2.3 1.5 - 4.5 g/dL   Albumin/Globulin Ratio 2.0 1.2 - 2.2   Bilirubin Total 0.3 0.0 - 1.2 mg/dL   Alkaline Phosphatase 80 44 - 121 IU/L   AST 34 0 - 40 IU/L   ALT 27 0 - 32 IU/L  Lipid Panel w/o Chol/HDL Ratio     Status: Abnormal   Collection Time: 09/07/22  9:38 AM  Result Value Ref Range   Cholesterol, Total 95 (L) 100 - 199 mg/dL   Triglycerides 119 0 - 149 mg/dL   HDL 52 >14 mg/dL  VLDL Cholesterol Cal 25 5 - 40 mg/dL   LDL Chol Calc (NIH) 18 0 - 99 mg/dL  Hgb W0J w/o eAG     Status: Abnormal   Collection Time: 09/07/22  9:38 AM  Result Value Ref Range   Hgb A1c MFr Bld 5.9 (H) 4.8 - 5.6 %    Comment:          Prediabetes: 5.7 - 6.4          Diabetes: >6.4           Glycemic control for adults with diabetes: <7.0   VAS [place] ABI WITH/WO TBI     Status: None   Collection Time: 10/20/22 11:00 AM  Result Value Ref Range   Right ABI .83    Left ABI 1.07     Radiology VAS US CAROTID  Result Date: 10/23/2022 Carotid Arterial Duplex Study Patient Name:  Bethany Lozano  Date of Exam:   10/20/2022 Medical Rec #: [id number]      Accession #:    [id number] Date of Birth: 1950/10/23     Patient Gender: F Patient Age:   57 years Exam Location:  [facility] Procedure:      VAS US CAROTID Referring Phys: Festus Barren --------------------------------------------------------------------------------  Other Factors: 11/25/2015 Rt CEA; 05/2016 Rt ICA stent. Performing Technologist: Salvadore Farber RVT  Examination Guidelines: A complete evaluation includes B-mode imaging, spectral Doppler, color Doppler, and power Doppler as needed of all accessible portions of each vessel. Bilateral testing is considered an integral part of a complete examination. Limited examinations for reoccurring indications may be performed as noted.  Right Carotid Findings: +----------+--------+--------+--------+------------------+--------+           PSV cm/sEDV cm/sStenosisPlaque DescriptionComments +----------+--------+--------+--------+------------------+--------+ CCA Prox  71      14                                         +----------+--------+--------+--------+------------------+--------+ CCA Mid   71      15                                         +----------+--------+--------+--------+------------------+--------+ CCA Distal53      11                                stent    +----------+--------+--------+--------+------------------+--------+ ICA Prox  76      18                                stent    +----------+--------+--------+--------+------------------+--------+ ICA Mid   81      17                                          +----------+--------+--------+--------+------------------+--------+ ICA Distal71      14                                         +----------+--------+--------+--------+------------------+--------+ ECA  122     15                                         +----------+--------+--------+--------+------------------+--------+ +----------+--------+-------+----------------+-------------------+           PSV cm/sEDV cmsDescribe        Arm Pressure (mmHG) +----------+--------+-------+----------------+-------------------+ [id number]            Multiphasic, WNL                    +----------+--------+-------+----------------+-------------------+ +---------+--------+--+--------+-+---------+ VertebralPSV cm/s30EDV cm/s8Antegrade +---------+--------+--+--------+-+---------+  Left Carotid Findings: +----------+--------+--------+--------+------------------+--------+           PSV cm/sEDV cm/sStenosisPlaque DescriptionComments +----------+--------+--------+--------+------------------+--------+ CCA Prox  76      16                                         +----------+--------+--------+--------+------------------+--------+ CCA Mid   67      17                                         +----------+--------+--------+--------+------------------+--------+ CCA Distal72      18                                         +----------+--------+--------+--------+------------------+--------+ ICA Prox  78      20      1-39%                              +----------+--------+--------+--------+------------------+--------+ ICA Mid   74      17                                         +----------+--------+--------+--------+------------------+--------+ ICA Distal65      20                                         +----------+--------+--------+--------+------------------+--------+ ECA       139     14                                          +----------+--------+--------+--------+------------------+--------+ +----------+--------+--------+----------------+-------------------+           PSV cm/sEDV cm/sDescribe        Arm Pressure (mmHG) +----------+--------+--------+----------------+-------------------+ [id number]             Multiphasic, WNL                    +----------+--------+--------+----------------+-------------------+ +---------+--------+--+--------+--+---------+ VertebralPSV cm/s36EDV cm/s10Antegrade +---------+--------+--+--------+--+---------+   Summary: Right Carotid: ICA and stent is patent with no evidence of re-stenosis. Left Carotid: Velocities in the left ICA are consistent with a 1-39% stenosis.               Non-hemodynamically significant plaque <50% noted in the CCA.  The               ECA appears <50% stenosed. Vertebrals:  Bilateral vertebral arteries demonstrate antegrade flow. Subclavians: Normal flow hemodynamics were seen in bilateral subclavian              arteries. *See table(s) above for measurements and observations.  Electronically signed by Festus Barren MD on 10/23/2022 at 7:27:14 AM.    Final    VAS [place] ABI WITH/WO TBI  Result Date: 10/23/2022  LOWER EXTREMITY DOPPLER STUDY Patient Name:  Bethany Lozano  Date of Exam:   10/20/2022 Medical Rec #: [id number]      Accession #:    [id number] Date of Birth: 06/19/1951     Patient Gender: F Patient Age:   76 years Exam Location:  [facility] Procedure:      VAS [place] ABI WITH/WO TBI Referring Phys: --------------------------------------------------------------------------------  Indications: Peripheral artery disease. High Risk Factors: Hyperlipidemia.  Comparison Study: 04/2022 Performing Technologist: Salvadore Farber RVT  Examination Guidelines: A complete evaluation includes at minimum, Doppler waveform signals and systolic blood pressure reading at the level of bilateral brachial, anterior tibial, and posterior tibial arteries, when vessel  segments are accessible. Bilateral testing is considered an integral part of a complete examination. Photoelectric Plethysmograph (PPG) waveforms and toe systolic pressure readings are included as required and additional duplex testing as needed. Limited examinations for reoccurring indications may be performed as noted.  ABI Findings: +---------+------------------+-----+---------+--------+ Right    Rt Pressure (mmHg)IndexWaveform Comment  +---------+------------------+-----+---------+--------+ Brachial 157                                      +---------+------------------+-----+---------+--------+ ATA      130               0.83 biphasic          +---------+------------------+-----+---------+--------+ PTA      126               0.80 triphasic         +---------+------------------+-----+---------+--------+ Great Toe119               0.76 Normal            +---------+------------------+-----+---------+--------+ +---------+------------------+-----+---------+-------+ Left     Lt Pressure (mmHg)IndexWaveform Comment +---------+------------------+-----+---------+-------+ Brachial 150                                     +---------+------------------+-----+---------+-------+ ATA      143               0.91 triphasic        +---------+------------------+-----+---------+-------+ PTA      168               1.07 triphasic        +---------+------------------+-----+---------+-------+ Great Toe119               0.76 Normal           +---------+------------------+-----+---------+-------+ +-------+-----------+-----------+------------+------------+ ABI/TBIToday's ABIToday's TBIPrevious ABIPrevious TBI +-------+-----------+-----------+------------+------------+ Right  .83        .76        .58         .32          +-------+-----------+-----------+------------+------------+ Left   1.07       .76        .  70         .23           +-------+-----------+-----------+------------+------------+  Bilateral ABIs and TBIs appear increased compared to prior study on 04/2022.  Summary: Right: Resting right ankle-brachial index indicates mild right lower extremity arterial disease. The right toe-brachial index is normal. Left: Resting left ankle-brachial index is within normal range. The left toe-brachial index is normal. *See table(s) above for measurements and observations.  Electronically signed by Festus Barren MD on 10/23/2022 at 7:27:07 AM.    Final     Assessment/Plan Essential hypertension blood pressure control important in reducing the progression of atherosclerotic disease. On appropriate oral medications.     Hyperlipidemia lipid control important in reducing the progression of atherosclerotic disease. Continue statin therapy  Atherosclerosis of native arteries of extremity with intermittent claudication (HCC) Her right ABI is slightly improved to 0.80 and her left ABI is stable at 1.07.  Stable mild symptoms currently.  Continue current medical regimen.  Recheck in 6 months.  Carotid stenosis Carotid duplex demonstrates a patent right carotid stent and 1 to 39% left ICA stenosis without progression from previous studies.  Doing well without any current symptoms.  Continue current medical regimen.  Recheck in 6 months.    Festus Barren, MD  10/24/2022 2:43 PM    This note was created with Dragon medical transcription system.  Any errors from dictation are purely unintentional

## 2022-10-24 NOTE — Assessment & Plan Note (Signed)
Her right ABI is slightly improved to 0.80 and her left ABI is stable at 1.07.  Stable mild symptoms currently.  Continue current medical regimen.  Recheck in 6 months.

## 2022-12-08 IMAGING — MG MM DIGITAL SCREENING BILAT W/ TOMO AND CAD
8 series · 8 of 24 positions shown · non-contrast
Comparison: Previous exam(s).

CLINICAL DATA: Screening.

EXAM:
DIGITAL SCREENING BILATERAL MAMMOGRAM WITH TOMOSYNTHESIS AND CAD
TECHNIQUE: Bilateral screening digital craniocaudal and mediolateral oblique
mammograms were obtained. Bilateral screening digital breast
tomosynthesis was performed. The images were evaluated with
computer-aided detection.

[R CC synth-2D]
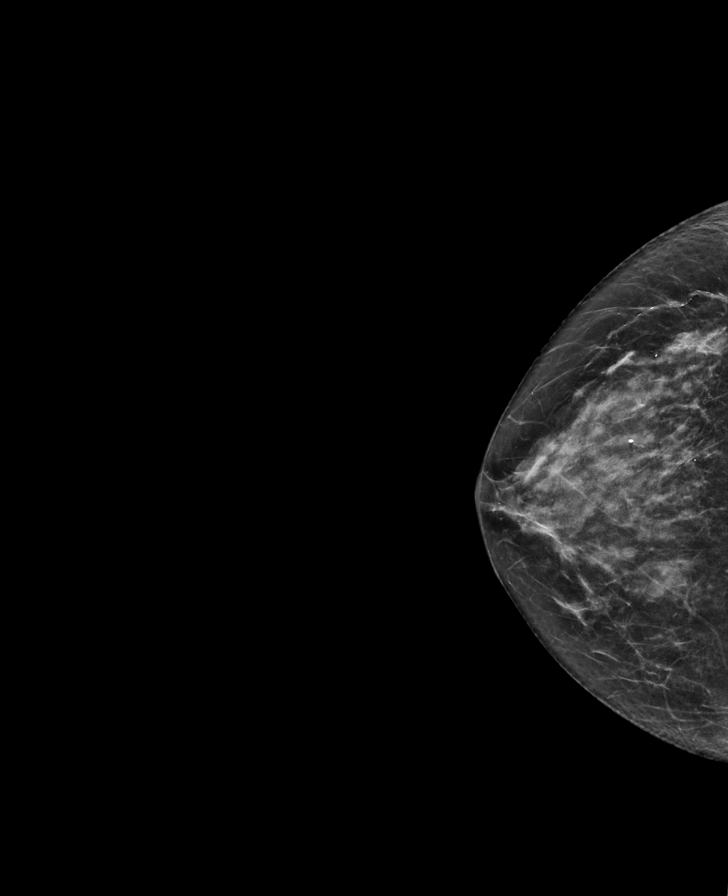

[R MLO synth-2D]
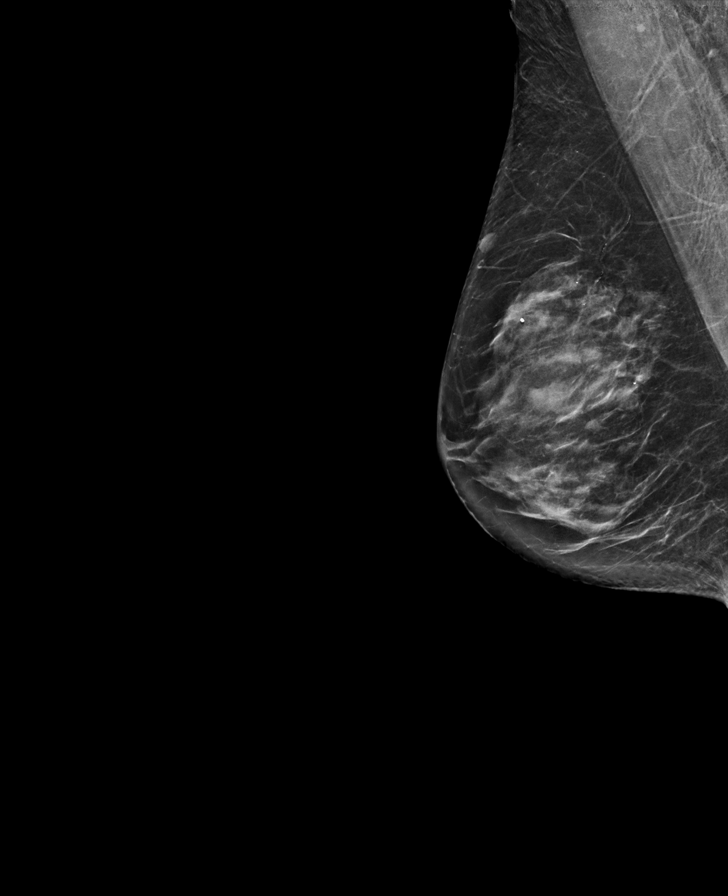

[L MLO synth-2D]
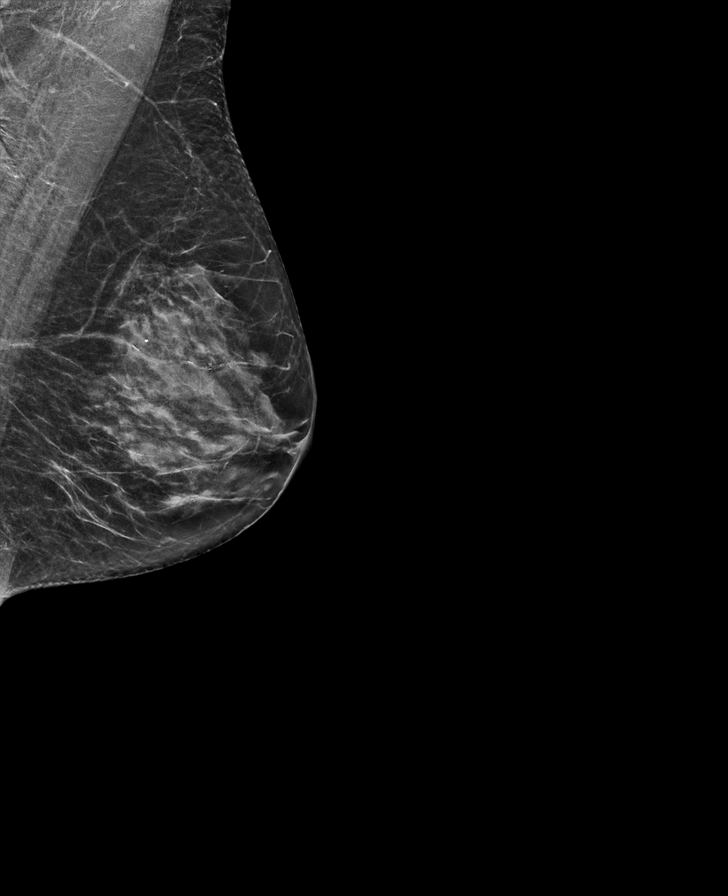

[L CC synth-2D]
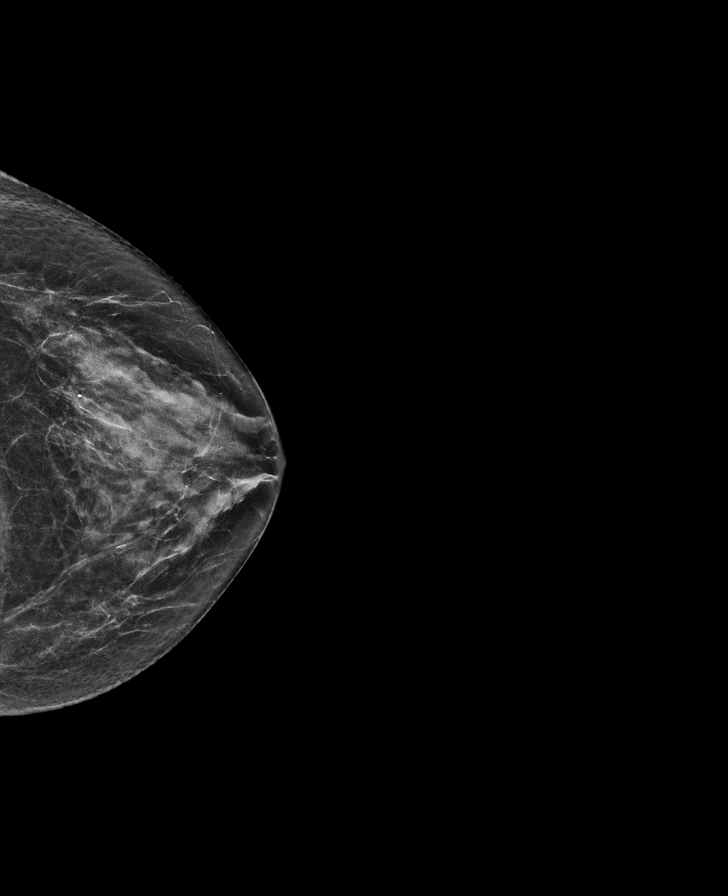

[R CC tomo · tomo slice 29/57.0]
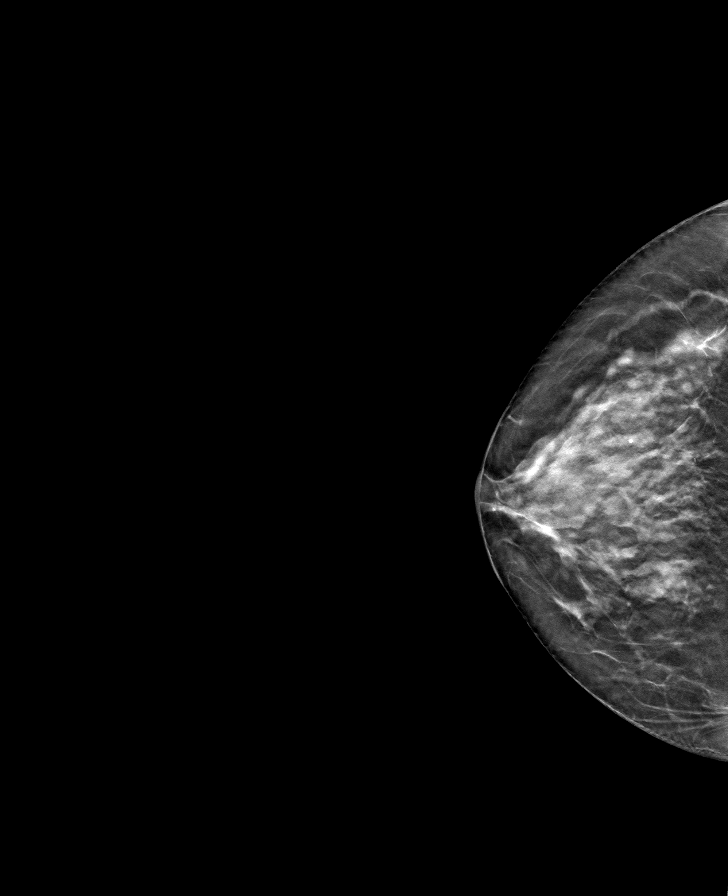

[R MLO tomo · tomo slice 36/71.0]
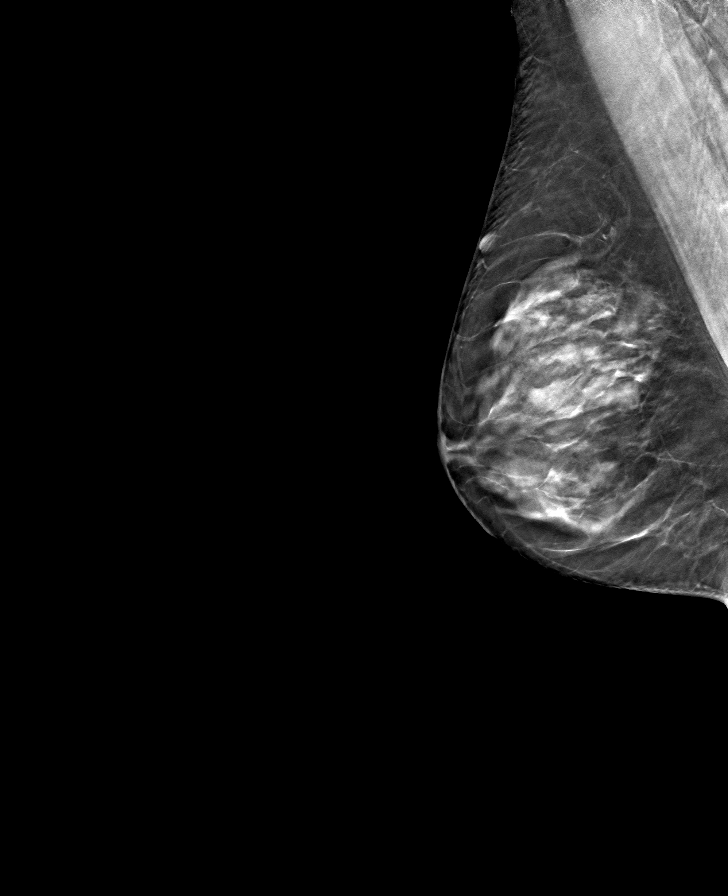

[L CC tomo · tomo slice 29/58.0]
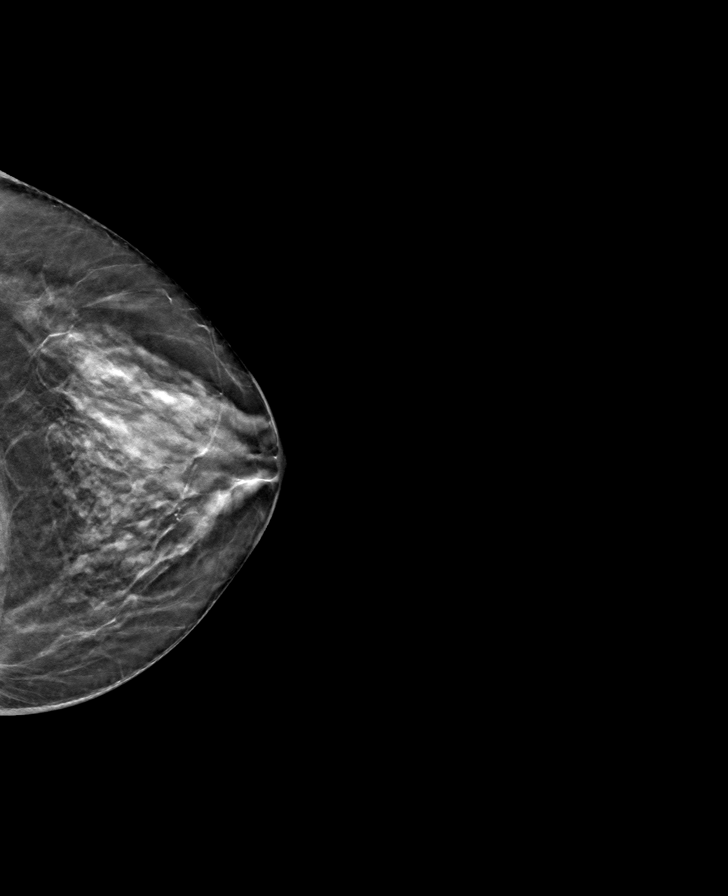

[L MLO tomo · tomo slice 28/55.0]
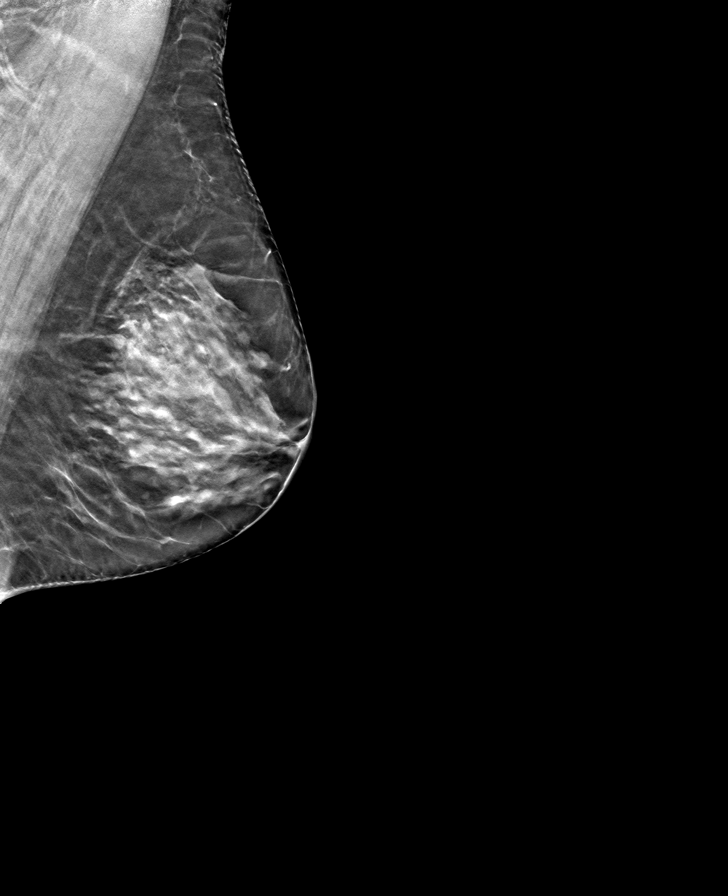

[8 of 24 positions shown; findings below may reference images not displayed]

ACR Breast Density Category c: The breast tissue is heterogeneously
dense, which may obscure small masses.
FINDINGS: There are no findings suspicious for malignancy.
IMPRESSION: No mammographic evidence of malignancy. A result letter of this
screening mammogram will be mailed directly to the patient.

RECOMMENDATION:
Screening mammogram in one year. (Code:Q3-W-BC3)

BI-RADS CATEGORY  1: Negative.

## 2022-12-12 ENCOUNTER — Other Ambulatory Visit: Payer: Self-pay | Admitting: Cardiovascular Disease

## 2022-12-12 DIAGNOSIS — I2510 Atherosclerotic heart disease of native coronary artery without angina pectoris: Secondary | ICD-10-CM

## 2022-12-14 ENCOUNTER — Other Ambulatory Visit: Payer: Self-pay | Admitting: Internal Medicine

## 2022-12-14 ENCOUNTER — Other Ambulatory Visit (INDEPENDENT_AMBULATORY_CARE_PROVIDER_SITE_OTHER): Payer: Self-pay | Admitting: Nurse Practitioner

## 2022-12-14 DIAGNOSIS — I10 Essential (primary) hypertension: Secondary | ICD-10-CM

## 2022-12-14 DIAGNOSIS — E785 Hyperlipidemia, unspecified: Secondary | ICD-10-CM

## 2022-12-14 DIAGNOSIS — N951 Menopausal and female climacteric states: Secondary | ICD-10-CM

## 2022-12-21 ENCOUNTER — Other Ambulatory Visit: Payer: Self-pay | Admitting: Internal Medicine

## 2022-12-21 DIAGNOSIS — Z1231 Encounter for screening mammogram for malignant neoplasm of breast: Secondary | ICD-10-CM

## 2022-12-29 ENCOUNTER — Ambulatory Visit
Admission: RE | Admit: 2022-12-29 | Discharge: 2022-12-29 | Disposition: A | Discharge status: Home / Self Care | Payer: Medicare HMO | Source: Ambulatory Visit | Attending: Internal Medicine | Admitting: Internal Medicine

## 2022-12-29 DIAGNOSIS — Z1231 Encounter for screening mammogram for malignant neoplasm of breast: Secondary | ICD-10-CM | POA: Insufficient documentation

## 2023-01-09 ENCOUNTER — Other Ambulatory Visit: Payer: Medicare HMO

## 2023-01-09 DIAGNOSIS — E782 Mixed hyperlipidemia: Secondary | ICD-10-CM | POA: Diagnosis not present

## 2023-01-09 DIAGNOSIS — I739 Peripheral vascular disease, unspecified: Secondary | ICD-10-CM

## 2023-01-10 LAB — LIPID PANEL
Chol/HDL Ratio: 1.8 ratio (ref 0.0–4.4)
Cholesterol, Total: 95 mg/dL — ABNORMAL LOW (ref 100–199)
HDL: 52 mg/dL (ref >39)
LDL Chol Calc (NIH): 21 mg/dL (ref 0–99)
Triglycerides: 126 mg/dL (ref 0–149)
VLDL Cholesterol Cal: 22 mg/dL (ref 5–40)

## 2023-01-10 LAB — CBC WITH DIFF/PLATELET
Basophils Absolute: 0 10*3/uL (ref 0.0–0.2)
Basos: 1 %
EOS (ABSOLUTE): 0.3 10*3/uL (ref 0.0–0.4)
Eos: 7 %
Hematocrit: 41.9 % (ref 34.0–46.6)
Hemoglobin: 14.2 g/dL (ref 11.1–15.9)
Immature Grans (Abs): 0 10*3/uL (ref 0.0–0.1)
Immature Granulocytes: 0 %
Lymphocytes Absolute: 1.2 10*3/uL (ref 0.7–3.1)
Lymphs: 29 %
MCH: 31.4 pg (ref 26.6–33.0)
MCHC: 33.9 g/dL (ref 31.5–35.7)
MCV: 93 fL (ref 79–97)
Monocytes Absolute: 0.4 10*3/uL (ref 0.1–0.9)
Monocytes: 10 %
Neutrophils Absolute: 2.3 10*3/uL (ref 1.4–7.0)
Neutrophils: 53 %
Platelets: 237 10*3/uL (ref 150–450)
RBC: 4.52 x10E6/uL (ref 3.77–5.28)
RDW: 12.7 % (ref 11.7–15.4)
WBC: 4.3 10*3/uL (ref 3.4–10.8)

## 2023-01-10 LAB — COMPREHENSIVE METABOLIC PANEL
ALT: 22 IU/L (ref 0–32)
AST: 30 IU/L (ref 0–40)
Albumin: 4.7 g/dL (ref 3.9–4.9)
Alkaline Phosphatase: 85 IU/L (ref 44–121)
BUN/Creatinine Ratio: 10 — ABNORMAL LOW (ref 12–28)
BUN: 6 mg/dL — ABNORMAL LOW (ref 8–27)
Bilirubin Total: 0.4 mg/dL (ref 0.0–1.2)
CO2: 26 mmol/L (ref 20–29)
Calcium: 9.5 mg/dL (ref 8.7–10.3)
Chloride: 95 mmol/L — ABNORMAL LOW (ref 96–106)
Creatinine, Ser: 0.63 mg/dL (ref 0.57–1.00)
Globulin, Total: 1.9 g/dL (ref 1.5–4.5)
Glucose: 97 mg/dL (ref 70–99)
Potassium: 4.5 mmol/L (ref 3.5–5.2)
Sodium: 135 mmol/L (ref 134–144)
Total Protein: 6.6 g/dL (ref 6.0–8.5)
eGFR: 95 mL/min/{1.73_m2} (ref >59)

## 2023-01-12 ENCOUNTER — Encounter: Payer: Self-pay | Admitting: Internal Medicine

## 2023-01-12 ENCOUNTER — Ambulatory Visit (INDEPENDENT_AMBULATORY_CARE_PROVIDER_SITE_OTHER): Payer: Medicare HMO | Admitting: Internal Medicine

## 2023-01-12 VITALS — BP 130/64 | HR 93 | Ht 65.0 in | Wt 160.6 lb

## 2023-01-12 DIAGNOSIS — M2550 Pain in unspecified joint: Secondary | ICD-10-CM | POA: Diagnosis not present

## 2023-01-12 DIAGNOSIS — Z1331 Encounter for screening for depression: Secondary | ICD-10-CM

## 2023-01-12 DIAGNOSIS — I10 Essential (primary) hypertension: Secondary | ICD-10-CM | POA: Diagnosis not present

## 2023-01-12 DIAGNOSIS — F17209 Nicotine dependence, unspecified, with unspecified nicotine-induced disorders: Secondary | ICD-10-CM

## 2023-01-12 DIAGNOSIS — R7303 Prediabetes: Secondary | ICD-10-CM | POA: Insufficient documentation

## 2023-01-12 DIAGNOSIS — E782 Mixed hyperlipidemia: Secondary | ICD-10-CM | POA: Diagnosis not present

## 2023-01-12 DIAGNOSIS — F418 Other specified anxiety disorders: Secondary | ICD-10-CM

## 2023-01-12 DIAGNOSIS — Z0001 Encounter for general adult medical examination with abnormal findings: Secondary | ICD-10-CM

## 2023-01-12 MED ORDER — CLONAZEPAM 0.5 MG PO TABS
0.5000 mg | ORAL_TABLET | Freq: Every day | ORAL | 2 refills | Status: DC
Start: 2023-01-12 — End: 2023-06-08

## 2023-01-12 MED ORDER — CELECOXIB 200 MG PO CAPS
200.0000 mg | ORAL_CAPSULE | Freq: Every day | ORAL | 1 refills | Status: DC
Start: 2023-01-12 — End: 2023-06-05

## 2023-01-12 NOTE — Progress Notes (Signed)
Established Patient Office Visit  Subjective:  Patient ID: Bethany Lozano, female    DOB: [birth year]  Age: 72 y.o. MRN: [id number]  Chief Complaint  Patient presents with   Follow-up    4 mo F/U    No new complaints, here for AWV refer to quality metrics and scanned documents.   LDL and TC well controlled on lab review. Triglycerides also satisfactory, cmp unremarkable and notable for improved fasting glucose.     No other concerns at this time.   Past Medical History:  Diagnosis Date   Anxiety    Depression    GERD (gastroesophageal reflux disease)    Heart murmur    Hemorrhoids    Hyperlipidemia    Peripheral vascular disease (HCC)    Sinusitis    Tobacco use     Past Surgical History:  Procedure Laterality Date   ABDOMINAL HYSTERECTOMY  1991   APPENDECTOMY     BICEPT TENODESIS Left 10/08/2017   Procedure: BICEPS TENODESIS;  Surgeon: Signa Kell, MD;  Location: [facility];  Service: Orthopedics;  Laterality: Left;   CARDIAC CATHETERIZATION N/A 11/08/2015   Procedure: Left Heart Cath and Coronary Angiography;  Surgeon: Laurier Nancy, MD;  Location: [facility];  Service: Cardiovascular;  Laterality: N/A;   CARDIAC CATHETERIZATION     Mynx placed in right artery after heart catherization   CAROTID STENT INSERTION Right 05/15/2016   COLONOSCOPY  2014   COLONOSCOPY N/A 12/27/2021   Procedure: COLONOSCOPY;  Surgeon: Regis Bill, MD;  Location: [facility];  Service: Endoscopy;  Laterality: N/A;   ENDARTERECTOMY Right 11/25/2015   Procedure: ENDARTERECTOMY CAROTID;  Surgeon: Annice Needy, MD;  Location: [facility];  Service: Vascular;  Laterality: Right;   ESOPHAGOGASTRODUODENOSCOPY N/A 12/27/2021   Procedure: ESOPHAGOGASTRODUODENOSCOPY (EGD);  Surgeon: Regis Bill, MD;  [facility];  Service: Endoscopy;  Laterality: N/A;   FISSURECTOMY  10/22/14   HEMORRHOIDECTOMY WITH HEMORRHOID BANDING  1991,06/20/14, 10/22/14   PERIPHERAL VASCULAR  CATHETERIZATION Right 05/15/2016   Procedure: Carotid PTA/Stent Intervention;  Surgeon: Annice Needy, MD;  Location: [facility];  Service: Cardiovascular;  Laterality: Right;   RESECTION DISTAL CLAVICAL  10/08/2017   Procedure: RESECTION DISTAL CLAVICAL;  Surgeon: Signa Kell, MD;  Location: [facility];  Service: Orthopedics;;   SHOULDER ARTHROSCOPY WITH SUBACROMIAL DECOMPRESSION Left 10/08/2017   Procedure: SHOULDER ARTHROSCOPY WITH SUBACROMIAL DECOMPRESSION;  Surgeon: Signa Kell, MD;  Location: [facility];  Service: Orthopedics;  Laterality: Left;   SHOULDER OPEN ROTATOR CUFF REPAIR Left 10/08/2017   Procedure: ROTATOR CUFF REPAIR SHOULDER OPEN Extensive Glenoid Humeral debridement;  Surgeon: Signa Kell, MD;  Location: [facility];  Service: Orthopedics;  Laterality: Left;   TUBAL LIGATION  1977    Social History   Socioeconomic History   Marital status: Single    Spouse name: Not on file   Number of children: Not on file   Years of education: Not on file   Highest education level: Not on file  Occupational History   Occupation: retired  Tobacco Use   Smoking status: Every Day    Current packs/day: 1.00    Average packs/day: 1 pack/day for 45.0 years (45.0 ttl pk-yrs)    Types: Cigarettes   Smokeless tobacco: Never   Tobacco comments:    Pt reports not ready to quit after education provided.  Vaping Use   Vaping status: Never Used  Substance and Sexual Activity   Alcohol use: Not Currently  Alcohol/week: 0.0 standard drinks of alcohol   Drug use: No   Sexual activity: Not Currently    Birth control/protection: Post-menopausal  Other Topics Concern   Not on file  Social History Narrative   Not on file   Social Determinants of Health   Financial Resource Strain: Not on file  Food Insecurity: Not on file  Transportation Needs: Not on file  Physical Activity: Not on file  Stress: Not on file  Social Connections: Not on file  Intimate Partner Violence: Not on file     Family History  Problem Relation Age of Onset   Hypertension Mother    Osteoporosis Mother    Heart attack Father    Diabetes Brother    Breast cancer Neg Hx     Allergies  Allergen Reactions   Codeine Nausea Only   Latex     Other reaction(Montey Ebel): Other (See Comments)   Adhesive [Tape] Itching and Rash   Penicillins Rash    Tolerates ampicillin  Has patient had a PCN reaction causing immediate rash, facial/tongue/throat swelling, SOB or lightheadedness with hypotension: no Has patient had a PCN reaction causing severe rash involving mucus membranes or skin necrosis: no Has patient had a PCN reaction that required hospitalization {no Has patient had a PCN reaction occurring within the last 10 years: no If all of the above answers are "NO", then may proceed with Cephalosporin use.    Review of Systems  Constitutional: Negative.   HENT: Negative.    Eyes: Negative.   Respiratory: Negative.    Cardiovascular: Negative.   Gastrointestinal: Negative.   Genitourinary: Negative.   Skin: Negative.   Neurological: Negative.   Endo/Heme/Allergies: Negative.        Objective:   BP (!) 150/70   Pulse 93   Ht 5\' 5"  (1.651 m)   Wt 160 lb 9.6 oz (72.8 kg)   SpO2 97%   BMI 26.73 kg/m   Vitals:   01/12/23 1347  BP: (!) 150/70  Pulse: 93  Height: 5\' 5"  (1.651 m)  Weight: 160 lb 9.6 oz (72.8 kg)  SpO2: 97%  BMI (Calculated): 26.73    Physical Exam Vitals reviewed.  Constitutional:      General: She is not in acute distress. HENT:     Head: Normocephalic.     Nose: Nose normal.     Mouth/Throat:     Mouth: Mucous membranes are moist.  Eyes:     Extraocular Movements: Extraocular movements intact.     Pupils: Pupils are equal, round, and reactive to light.  Cardiovascular:     Rate and Rhythm: Normal rate and regular rhythm.     Heart sounds: Murmur heard.     Crescendo decrescendo systolic murmur is present with a grade of 3/6.  Pulmonary:     Effort:  Pulmonary effort is normal.     Breath sounds: No rhonchi or rales.  Abdominal:     General: Abdomen is flat.     Palpations: There is no hepatomegaly, splenomegaly or mass.  Musculoskeletal:        General: Normal range of motion.     Cervical back: Normal range of motion. No tenderness.  Skin:    General: Skin is warm and dry.  Neurological:     General: No focal deficit present.     Mental Status: She is alert and oriented to person, place, and time.     Cranial Nerves: No cranial nerve deficit.     Motor: No  weakness.  Psychiatric:        Mood and Affect: Mood normal.        Behavior: Behavior normal.      No results found for any visits on 01/12/23.  Recent Results (from the past 2160 hour(Sheppard Luckenbach))  VAS [place] ABI WITH/WO TBI     Status: None   Collection Time: 10/20/22 11:00 AM  Result Value Ref Range   Right ABI .83    Left ABI 1.07   CBC With Diff/Platelet     Status: None   Collection Time: 01/09/23 12:18 PM  Result Value Ref Range   WBC 4.3 3.4 - 10.8 x10E3/uL   RBC 4.52 3.77 - 5.28 x10E6/uL   Hemoglobin 14.2 11.1 - 15.9 g/dL   Hematocrit 16.1 09.6 - 46.6 %   MCV 93 79 - 97 fL   MCH 31.4 26.6 - 33.0 pg   MCHC 33.9 31.5 - 35.7 g/dL   RDW 04.5 40.9 - 81.1 %   Platelets 237 150 - 450 x10E3/uL   Neutrophils 53 Not Estab. %   Lymphs 29 Not Estab. %   Monocytes 10 Not Estab. %   Eos 7 Not Estab. %   Basos 1 Not Estab. %   Neutrophils Absolute 2.3 1.4 - 7.0 x10E3/uL   Lymphocytes Absolute 1.2 0.7 - 3.1 x10E3/uL   Monocytes Absolute 0.4 0.1 - 0.9 x10E3/uL   EOS (ABSOLUTE) 0.3 0.0 - 0.4 x10E3/uL   Basophils Absolute 0.0 0.0 - 0.2 x10E3/uL   Immature Granulocytes 0 Not Estab. %   Immature Grans (Abs) 0.0 0.0 - 0.1 x10E3/uL  Lipid panel     Status: Abnormal   Collection Time: 01/09/23 12:18 PM  Result Value Ref Range   Cholesterol, Total 95 (L) 100 - 199 mg/dL   Triglycerides 914 0 - 149 mg/dL   HDL 52 >78 mg/dL   VLDL Cholesterol Cal 22 5 - 40 mg/dL   LDL Chol Calc  (NIH) 21 0 - 99 mg/dL   Chol/HDL Ratio 1.8 0.0 - 4.4 ratio    Comment:                                   T. Chol/HDL Ratio                                             Men  Women                               1/2 Avg.Risk  3.4    3.3                                   Avg.Risk  5.0    4.4                                2X Avg.Risk  9.6    7.1                                3X Avg.Risk 23.4   11.0   Comprehensive metabolic panel  Status: Abnormal   Collection Time: 01/09/23 12:18 PM  Result Value Ref Range   Glucose 97 70 - 99 mg/dL   BUN 6 (L) 8 - 27 mg/dL   Creatinine, Ser 4.09 0.57 - 1.00 mg/dL   eGFR 95 >81 XB/JYN/8.29   BUN/Creatinine Ratio 10 (L) 12 - 28   Sodium 135 134 - 144 mmol/L   Potassium 4.5 3.5 - 5.2 mmol/L   Chloride 95 (L) 96 - 106 mmol/L   CO2 26 20 - 29 mmol/L   Calcium 9.5 8.7 - 10.3 mg/dL   Total Protein 6.6 6.0 - 8.5 g/dL   Albumin 4.7 3.9 - 4.9 g/dL   Globulin, Total 1.9 1.5 - 4.5 g/dL   Bilirubin Total 0.4 0.0 - 1.2 mg/dL   Alkaline Phosphatase 85 44 - 121 IU/L   AST 30 0 - 40 IU/L   ALT 22 0 - 32 IU/L      Assessment & Plan:   As per problem list. The current medical regimen is effective;  continue present plan and medications.   Problem List Items Addressed This Visit       Cardiovascular and Mediastinum   Essential hypertension     Other   Hyperlipidemia - Primary   Depression with anxiety   Relevant Medications   clonazePAM (KLONOPIN) 0.5 MG tablet   Tobacco use disorder, continuous   Prediabetes   Other Visit Diagnoses     Arthralgia, unspecified joint       Relevant Medications   celecoxib (CELEBREX) 200 MG capsule       Return in about 3 months (around 04/14/2023) for fu with labs prior.   Total time spent: 40 minutes  Luna Fuse, MD  01/12/2023   This document may have been prepared by [facility] Voice Recognition software and as such may include unintentional dictation errors.

## 2023-01-23 ENCOUNTER — Encounter: Payer: Self-pay | Admitting: Cardiology

## 2023-01-23 ENCOUNTER — Ambulatory Visit: Payer: Medicare HMO | Admitting: Cardiovascular Disease

## 2023-01-23 VITALS — BP 130/69 | HR 74 | Ht 65.0 in | Wt 159.2 lb

## 2023-01-23 DIAGNOSIS — I10 Essential (primary) hypertension: Secondary | ICD-10-CM | POA: Diagnosis not present

## 2023-01-23 DIAGNOSIS — Z720 Tobacco use: Secondary | ICD-10-CM | POA: Diagnosis not present

## 2023-01-23 DIAGNOSIS — E782 Mixed hyperlipidemia: Secondary | ICD-10-CM

## 2023-01-23 NOTE — Progress Notes (Signed)
Cardiology Office Note   Date:  01/23/2023   ID:  Bethany Lozano, DOB [birth year], MRN [id number]  PCP:  Sherron Monday, MD  Cardiologist:  Marisue Ivan, NP      History of Present Illness: Bethany Lozano is a 72 y.o. female who presents for  Chief Complaint  Patient presents with   Follow-up    Med F/U    Patient in office for routine cardiac exam. Denies chest pain, shortness of breath, dizziness, lower extremity edema.      Past Medical History:  Diagnosis Date   Anxiety    Depression    GERD (gastroesophageal reflux disease)    Heart murmur    Hemorrhoids    Hyperlipidemia    Peripheral vascular disease (HCC)    Sinusitis    Tobacco use      Past Surgical History:  Procedure Laterality Date   ABDOMINAL HYSTERECTOMY  1991   APPENDECTOMY     BICEPT TENODESIS Left 10/08/2017   Procedure: BICEPS TENODESIS;  Surgeon: Signa Kell, MD;  Location: [facility];  Service: Orthopedics;  Laterality: Left;   CARDIAC CATHETERIZATION N/A 11/08/2015   Procedure: Left Heart Cath and Coronary Angiography;  Surgeon: Laurier Nancy, MD;  Location: [facility];  Service: Cardiovascular;  Laterality: N/A;   CARDIAC CATHETERIZATION     Mynx placed in right artery after heart catherization   CAROTID STENT INSERTION Right 05/15/2016   COLONOSCOPY  2014   COLONOSCOPY N/A 12/27/2021   Procedure: COLONOSCOPY;  Surgeon: Regis Bill, MD;  Location: [facility];  Service: Endoscopy;  Laterality: N/A;   ENDARTERECTOMY Right 11/25/2015   Procedure: ENDARTERECTOMY CAROTID;  Surgeon: Annice Needy, MD;  Location: [facility];  Service: Vascular;  Laterality: Right;   ESOPHAGOGASTRODUODENOSCOPY N/A 12/27/2021   Procedure: ESOPHAGOGASTRODUODENOSCOPY (EGD);  Surgeon: Regis Bill, MD;  Location: [facility] ENDOSCOPY;  Service: Endoscopy;  Laterality: N/A;   FISSURECTOMY  10/22/14   HEMORRHOIDECTOMY WITH HEMORRHOID BANDING  1991,06/20/14, 10/22/14   PERIPHERAL VASCULAR  CATHETERIZATION Right 05/15/2016   Procedure: Carotid PTA/Stent Intervention;  Surgeon: Annice Needy, MD;  Location: [facility];  Service: Cardiovascular;  Laterality: Right;   RESECTION DISTAL CLAVICAL  10/08/2017   Procedure: RESECTION DISTAL CLAVICAL;  Surgeon: Signa Kell, MD;  Location: [facility];  Service: Orthopedics;;   SHOULDER ARTHROSCOPY WITH SUBACROMIAL DECOMPRESSION Left 10/08/2017   Procedure: SHOULDER ARTHROSCOPY WITH SUBACROMIAL DECOMPRESSION;  Surgeon: Signa Kell, MD;  Location: [facility];  Service: Orthopedics;  Laterality: Left;   SHOULDER OPEN ROTATOR CUFF REPAIR Left 10/08/2017   Procedure: ROTATOR CUFF REPAIR SHOULDER OPEN Extensive Glenoid Humeral debridement;  Surgeon: Signa Kell, MD;  Location: [facility];  Service: Orthopedics;  Laterality: Left;   TUBAL LIGATION  1977     Current Outpatient Medications  Medication Sig Dispense Refill   acetaminophen (TYLENOL) 500 MG tablet Take 500 mg by mouth every 6 (six) hours as needed for moderate pain.     aspirin 81 MG EC tablet Take 81 mg by mouth daily.     celecoxib (CELEBREX) 200 MG capsule Take 1 capsule (200 mg total) by mouth daily. As needed for joint pain 30 capsule 1   chlorthalidone (HYGROTON) 25 MG tablet Take 1 tablet (25 mg total) by mouth daily. 90 tablet 1   Cholecalciferol (VITAMIN D3) 5000 units CAPS Take 1,000 Units by mouth daily.      clonazePAM (KLONOPIN) 0.5 MG tablet Take 1 tablet (0.5 mg total) by mouth  daily. 30 tablet 2   clopidogrel (PLAVIX) 75 MG tablet TAKE 1 TABLET BY MOUTH EVERY DAY 90 tablet 3   cyanocobalamin (VITAMIN B12) 100 MCG tablet Take 100 mcg by mouth once a week.     DULoxetine (CYMBALTA) 60 MG capsule TAKE 1 CAPSULE BY MOUTH EVERY DAY 90 capsule 1   estradiol (ESTRACE) 0.5 MG tablet TAKE 1 TABLET BY MOUTH EVERY DAY 90 tablet 1   Evolocumab (REPATHA SURECLICK) 140 MG/ML SOAJ INJECT 1 INJECTION EVERY 2 WEEKS 6 mL 1   fluticasone (FLONASE) 50 MCG/ACT nasal spray Place 1 spray into  both nostrils daily as needed for allergies or rhinitis.   0   ondansetron (ZOFRAN) 4 MG tablet Take 1 tablet (4 mg total) by mouth every 6 (six) hours as needed for nausea. 20 tablet 0   rosuvastatin (CRESTOR) 10 MG tablet TAKE 1 TABLET BY MOUTH EVERY DAY 90 tablet 1   valsartan (DIOVAN) 80 MG tablet TAKE 1 TABLET BY MOUTH EVERY MORNING 90 tablet 1   No current facility-administered medications for this visit.    Allergies:   Codeine, Latex, Adhesive [tape], and Penicillins    Social History:   reports that she has been smoking cigarettes. She has a 45 pack-year smoking history. She has never used smokeless tobacco. She reports that she does not currently use alcohol. She reports that she does not use drugs.   Family History:  family history includes Diabetes in her brother; Heart attack in her father; Hypertension in her mother; Osteoporosis in her mother.    ROS:     Review of Systems  Constitutional: Negative.   HENT: Negative.    Eyes: Negative.   Respiratory: Negative.    Cardiovascular: Negative.   Gastrointestinal: Negative.   Genitourinary: Negative.   Musculoskeletal: Negative.   Skin: Negative.   Neurological: Negative.   Endo/Heme/Allergies: Negative.   Psychiatric/Behavioral: Negative.    All other systems reviewed and are negative.     All other systems are reviewed and negative.    PHYSICAL EXAM: VS:  BP 130/69   Pulse 74   Ht 5\' 5"  (1.651 m)   Wt 159 lb 3.2 oz (72.2 kg)   SpO2 99%   BMI 26.49 kg/m  , BMI Body mass index is 26.49 kg/m. Last weight:  Wt Readings from Last 3 Encounters:  01/23/23 159 lb 3.2 oz (72.2 kg)  01/12/23 160 lb 9.6 oz (72.8 kg)  10/24/22 160 lb 12.8 oz (72.9 kg)     Physical Exam Vitals and nursing note reviewed.  Constitutional:      Appearance: Normal appearance. She is normal weight.  HENT:     Head: Normocephalic and atraumatic.     Nose: Nose normal.     Mouth/Throat:     Mouth: Mucous membranes are moist.      Pharynx: Oropharynx is clear.  Eyes:     Conjunctiva/sclera: Conjunctivae normal.     Pupils: Pupils are equal, round, and reactive to light.  Cardiovascular:     Rate and Rhythm: Normal rate and regular rhythm.     Pulses: Normal pulses.     Heart sounds: Normal heart sounds.  Pulmonary:     Effort: Pulmonary effort is normal.     Breath sounds: Normal breath sounds.  Abdominal:     General: Abdomen is flat. Bowel sounds are normal.     Palpations: Abdomen is soft.  Musculoskeletal:        General: Normal range of motion.  Cervical back: Normal range of motion.  Skin:    General: Skin is warm and dry.  Neurological:     General: No focal deficit present.     Mental Status: She is alert and oriented to person, place, and time. Mental status is at baseline.  Psychiatric:        Mood and Affect: Mood normal.        Behavior: Behavior normal.     EKG: none today  Recent Labs: 01/09/2023: ALT 22; BUN 6; Creatinine, Ser 0.63; Hemoglobin 14.2; Platelets 237; Potassium 4.5; Sodium 135    Lipid Panel    Component Value Date/Time   CHOL 95 (L) 01/09/2023 1218   TRIG 126 01/09/2023 1218   HDL 52 01/09/2023 1218   CHOLHDL 1.8 01/09/2023 1218   LDLCALC 21 01/09/2023 1218      Other studies Reviewed: Patient: [id number] - Lorna Strother [facility] DOB:  05/15/1951  Date:  04/20/2021 11:45 Provider: Adrian Blackwater MD Encounter: ECHO   Page 2 REASON FOR VISIT  Visit for: Echocardiogram/Hypertension  Sex:   Female   wt= 145   lbs.  BP= 154/84  Height= 65   inches.   TESTS  Imaging: Echocardiogram:  An echocardiogram in (2-d) mode was performed and in Doppler mode with color flow velocity mapping was performed. Aortic valve flow velocity 1.7  m/s and systolic calculated mean flow gradient 7  mmHg. Mitral valve diastolic peak flow velocity E .80    m/s and E/A ratio 0.7. Aortic root diameter 2.9   cm. The LVOT internal diameter 1.7  cm and flow velocity was abnormal 1.1   m/s. LV systolic dimension 3.7   cm, diastolic 4.8  cm, posterior wall thickness 0.9  cm, fractional shortening 25  %, and EF 55 %. IVS thickness 0.9    cm. LA dimension 3.3 cm  RIGHT atrium= 7.8   cm2. Aortic Valve has Trace Regurgitation.     ASSESSMENT  Suboptimal study due to poor windows.  Normal chamber sizes.  Normal left ventricular systolic function.  Normal right ventricular systolic function.  Normal right ventricular diastolic function.  Normal left ventricular wall motion.  Normal right ventricular wall motion.  Normal pulmonary artery pressure.  Trace aortic regurgitation.  No pericardial effusion.  No LVH.   THERAPY   Referring physician: Laurier Nancy  Sonographer: STU.   Adrian Blackwater MD  Electronically signed by: Adrian Blackwater     Date: 04/22/2021 14:48  Patient: [id number] - Morna Flud [facility] DOB:  10-May-1951  Date:  04/22/2021 08:00 Provider: Adrian Blackwater MD Encounter: [facility]                                                                                [facility] [address] [place], [place] [address] [phone] STUDY:  Gated Stress / Rest Myocardial Perfusion Imaging Tomographic (SPECT) Including attenuation correction Wall Motion, Left Ventricular Ejection Fraction By Gated Technique.Treadmill Stress Test. SEX: Female  WEIGHT: 145 lbs  HEIGHT: 65 in          ARMS UP: YES/NO  REFERRING PHYSICIAN: Dr.Shaukat Welton Flakes                                                                                                                                                                                                                       INDICATION FOR STUDY: CAD                                                                                                                                                                                                                      TECHNIQUE:  Approximately 20 minutes following the intravenous administration of 10.1 mCi of Tc-45m Sestamibi after stress testing in a reclined supine position with arms above their head if able to do so, gated SPECT imaging of the heart was performed. After about a 2hr break, the patient was injected intravenously with 31.6 mCi of Tc-66m Sestamibi.  Approximately 45 minutes later in the same position as stress imaging SPECT rest imaging of the heart was performed.  STRESS BY:  Adrian Blackwater, MD PROTOCOL: Marlowe Aschoff  MAX PRED HR:  152                    85%: 129               75%: 114                                                                                                                   RESTING BP: 138/80  RESTING HR: 77  PEAK BP: 152/82   PEAK HR: 122 (80%)                                                                    EXERCISE DURATION: 5:01                                             METS: 3.4     REASON FOR TEST TERMINATION: Leg discomfort                                                                                                                                  SYMPTOMS: Leg discomfort  [facility] TREADMILL SCORE:  5                                      RISK:  Low  EKG RESULTS: NSR. 77/min. LVH. No significant ST changes at peak exercise.                                                              IMAGE QUALITY: Good                                                                                                                                                                                                                                                                                                                                   PERFUSION/WALL MOTION FINDINGS: EF = 72%. No perfusion defects, normal wall motion.                                                                           IMPRESSION:  Normal stress test with normal LVEF.  Adrian Blackwater, MD Stress Interpreting Physician / Nuclear Interpreting Physician        Adrian Blackwater MD  Electronically signed by: Adrian Blackwater     Date: 04/28/2021 11:04  Patient: [id number] - Niajah Sipos [facility] DOB:  1951-04-24  Date:  02/10/2020 09:30 Provider: Adrian Blackwater MD Encounter: ALL ANGIOGRAMS (CTA BRAIN, CAROTIDS, RENAL ARTERIES, PE)    TESTS  Imaging: Computed Tomographic Angiography:  Cardiac multidetector CT was performed paying particular attention to the coronary arteries for the diagnosis of: CAD. Diagnostic Drugs:  Administered iohexol (Omnipaque) through an antecubital vein and images from the examination were analyzed for the presence and extent of coronary artery disease, using 3D image processing software. 100 mL of non-ionic contrast (Omnipaque) was used.   ASSESSMENT   The proximal right coronary artery (RCA) was abnormal:1  The mid right coronary artery (RCA) was abnormal:1  The distal right coronary artery (RCA) was abnormal:1  The posterior descending coronary arteries were abnormal:1     TEST CONCLUSIONS  Quality of study: Good.  1-Calcium score: 937  2-Right dominant system.  3-Severely calcified proximal LAD and proximal LCX with mild to moderate disease. Normal RCA.   Adrian Blackwater MD  Electronically signed by: Adrian Blackwater     Date: 02/20/2020 11:09    ASSESSMENT AND PLAN:    ICD-10-CM   1. Essential hypertension  I10      2. Mixed hyperlipidemia  E78.2     3. Tobacco use  Z72.0        Problem List Items Addressed This Visit       Cardiovascular and Mediastinum   Essential hypertension - Primary    Blood pressure within goal. Continue home dose of valsartan        Other   Hyperlipidemia    Well controlled. Continue Repatha and Crestor.       Tobacco use    No interest in quitting. Understands this is part of the cause of her vascular disease.         Disposition:   Return in about 6 months (around 07/26/2023).    Total time spent: 25 minutes  Signed,  Google, NP  01/23/2023 1:40 PM    [facility]

## 2023-01-23 NOTE — Assessment & Plan Note (Signed)
Blood pressure within goal. -Continue home dose of valsartan

## 2023-01-23 NOTE — Assessment & Plan Note (Signed)
No interest in quitting.  Understands this is part of the cause of her vascular disease.

## 2023-01-23 NOTE — Assessment & Plan Note (Signed)
Well controlled. Continue Repatha and Crestor.

## 2023-02-03 ENCOUNTER — Other Ambulatory Visit: Payer: Self-pay | Admitting: Internal Medicine

## 2023-02-03 DIAGNOSIS — I10 Essential (primary) hypertension: Secondary | ICD-10-CM

## 2023-02-22 ENCOUNTER — Ambulatory Visit
Admission: RE | Admit: 2023-02-22 | Discharge: 2023-02-22 | Disposition: A | Discharge status: Home / Self Care | Payer: Medicare HMO | Source: Ambulatory Visit | Attending: Acute Care | Admitting: Acute Care

## 2023-02-22 DIAGNOSIS — Z122 Encounter for screening for malignant neoplasm of respiratory organs: Secondary | ICD-10-CM | POA: Diagnosis not present

## 2023-02-22 DIAGNOSIS — F17210 Nicotine dependence, cigarettes, uncomplicated: Secondary | ICD-10-CM | POA: Insufficient documentation

## 2023-02-22 DIAGNOSIS — I700 Atherosclerosis of aorta: Secondary | ICD-10-CM | POA: Diagnosis not present

## 2023-02-22 DIAGNOSIS — Z87891 Personal history of nicotine dependence: Secondary | ICD-10-CM

## 2023-02-22 DIAGNOSIS — J439 Emphysema, unspecified: Secondary | ICD-10-CM | POA: Diagnosis not present

## 2023-02-22 DIAGNOSIS — I358 Other nonrheumatic aortic valve disorders: Secondary | ICD-10-CM | POA: Diagnosis not present

## 2023-02-28 ENCOUNTER — Other Ambulatory Visit: Payer: Self-pay | Admitting: Internal Medicine

## 2023-02-28 DIAGNOSIS — F418 Other specified anxiety disorders: Secondary | ICD-10-CM

## 2023-03-02 ENCOUNTER — Telehealth: Payer: Self-pay | Admitting: Acute Care

## 2023-03-02 ENCOUNTER — Other Ambulatory Visit: Payer: Self-pay

## 2023-03-02 DIAGNOSIS — Z87891 Personal history of nicotine dependence: Secondary | ICD-10-CM

## 2023-03-02 DIAGNOSIS — Z122 Encounter for screening for malignant neoplasm of respiratory organs: Secondary | ICD-10-CM

## 2023-03-02 DIAGNOSIS — F17210 Nicotine dependence, cigarettes, uncomplicated: Secondary | ICD-10-CM

## 2023-03-02 MED ORDER — PAXLOVID (300/100) 20 X 150 MG & 10 X 100MG PO TBPK: 3.0000 | ORAL_TABLET | Freq: Two times a day (BID) | ORAL | 0 refills | Status: AC

## 2023-03-02 NOTE — Telephone Encounter (Signed)
Called and spoke to pt regarding recent LDCT results with specific mention to the aortic valve calcifications. Patient states she is aware of this finding as is her cardiologist. Pt had her last echo in 08/2021. Advised pt I would send her results to her PCP along with her cardiologist. Patient verbalized understanding and is appreciative of call. 12 month repeat LDCT order placed.   IMPRESSION: 1. Lung-RADS 2, benign appearance or behavior. Continue annual screening with low-dose chest CT without contrast in 12 months. 2. Aortic Atherosclerosis (ICD10-I70.0) and Emphysema (ICD10-J43.9). Coronary artery atherosclerosis. 3. Aortic valvular calcifications. Consider echocardiography to evaluate for valvular dysfunction.     Electronically Signed   By: Jeronimo Greaves M.D.   On: 03/01/2023 16:52

## 2023-04-13 ENCOUNTER — Other Ambulatory Visit: Payer: Medicare HMO

## 2023-04-13 DIAGNOSIS — E782 Mixed hyperlipidemia: Secondary | ICD-10-CM | POA: Diagnosis not present

## 2023-04-14 LAB — LIPID PANEL
Chol/HDL Ratio: 1.7 {ratio} (ref 0.0–4.4)
Cholesterol, Total: 95 mg/dL — ABNORMAL LOW (ref 100–199)
HDL: 55 mg/dL (ref >39)
LDL Chol Calc (NIH): 22 mg/dL (ref 0–99)
Triglycerides: 92 mg/dL (ref 0–149)
VLDL Cholesterol Cal: 18 mg/dL (ref 5–40)

## 2023-04-14 LAB — COMPREHENSIVE METABOLIC PANEL
ALT: 24 [IU]/L (ref 0–32)
AST: 28 [IU]/L (ref 0–40)
Albumin: 4.7 g/dL (ref 3.9–4.9)
Alkaline Phosphatase: 92 [IU]/L (ref 44–121)
BUN/Creatinine Ratio: 15 (ref 12–28)
BUN: 10 mg/dL (ref 8–27)
Bilirubin Total: 0.6 mg/dL (ref 0.0–1.2)
CO2: 27 mmol/L (ref 20–29)
Calcium: 9.7 mg/dL (ref 8.7–10.3)
Chloride: 89 mmol/L — ABNORMAL LOW (ref 96–106)
Creatinine, Ser: 0.66 mg/dL (ref 0.57–1.00)
Globulin, Total: 2.2 g/dL (ref 1.5–4.5)
Glucose: 102 mg/dL — ABNORMAL HIGH (ref 70–99)
Potassium: 4.2 mmol/L (ref 3.5–5.2)
Sodium: 130 mmol/L — ABNORMAL LOW (ref 134–144)
Total Protein: 6.9 g/dL (ref 6.0–8.5)
eGFR: 94 mL/min/{1.73_m2} (ref >59)

## 2023-04-17 ENCOUNTER — Ambulatory Visit: Payer: Medicare HMO | Admitting: Internal Medicine

## 2023-04-17 ENCOUNTER — Encounter: Payer: Self-pay | Admitting: Internal Medicine

## 2023-04-17 VITALS — BP 150/75 | HR 79 | Ht 65.0 in | Wt 159.2 lb

## 2023-04-17 DIAGNOSIS — Z23 Encounter for immunization: Secondary | ICD-10-CM

## 2023-04-17 DIAGNOSIS — R7303 Prediabetes: Secondary | ICD-10-CM | POA: Diagnosis not present

## 2023-04-17 DIAGNOSIS — I10 Essential (primary) hypertension: Secondary | ICD-10-CM

## 2023-04-17 DIAGNOSIS — E782 Mixed hyperlipidemia: Secondary | ICD-10-CM | POA: Diagnosis not present

## 2023-04-17 NOTE — Progress Notes (Signed)
Established Patient Office Visit  Subjective:  Patient ID: Bethany Lozano, female    DOB: 11/09/1950  Age: 72 y.o. MRN: [id number]  Chief Complaint  Patient presents with   Follow-up    3 mo f/u with lab results    No new complaints, here for lab review and medication refills. Recovering from recent covid infection, didn't complete Paxlovid after the first dose. Labs reviewed and notable for well controlled lipids but sodium is low with normal Cr and sodium.  No other concerns at this time.   Past Medical History:  Diagnosis Date   Anxiety    Depression    GERD (gastroesophageal reflux disease)    Heart murmur    Hemorrhoids    Hyperlipidemia    Peripheral vascular disease (HCC)    Sinusitis    Tobacco use     Past Surgical History:  Procedure Laterality Date   ABDOMINAL HYSTERECTOMY  1991   APPENDECTOMY     BICEPT TENODESIS Left 10/08/2017   Procedure: BICEPS TENODESIS;  Surgeon: Signa Kell, MD;  Location: [facility];  Service: Orthopedics;  Laterality: Left;   CARDIAC CATHETERIZATION N/A 11/08/2015   Procedure: Left Heart Cath and Coronary Angiography;  Surgeon: Laurier Nancy, MD;  Location: [facility];  Service: Cardiovascular;  Laterality: N/A;   CARDIAC CATHETERIZATION     Mynx placed in right artery after heart catherization   CAROTID STENT INSERTION Right 05/15/2016   COLONOSCOPY  2014   COLONOSCOPY N/A 12/27/2021   Procedure: COLONOSCOPY;  Surgeon: Regis Bill, MD;  Location: [facility];  Service: Endoscopy;  Laterality: N/A;   ENDARTERECTOMY Right 11/25/2015   Procedure: ENDARTERECTOMY CAROTID;  Surgeon: Annice Needy, MD;  Location: [facility];  Service: Vascular;  Laterality: Right;   ESOPHAGOGASTRODUODENOSCOPY N/A 12/27/2021   Procedure: ESOPHAGOGASTRODUODENOSCOPY (EGD);  Surgeon: Regis Bill, MD;  Location: [facility];  Service: Endoscopy;  Laterality: N/A;   FISSURECTOMY  10/22/14   HEMORRHOIDECTOMY WITH HEMORRHOID BANDING   1991,06/20/14, 10/22/14   PERIPHERAL VASCULAR CATHETERIZATION Right 05/15/2016   Procedure: Carotid PTA/Stent Intervention;  Surgeon: Annice Needy, MD;  Location: [facility];  Service: Cardiovascular;  Laterality: Right;   RESECTION DISTAL CLAVICAL  10/08/2017   Procedure: RESECTION DISTAL CLAVICAL;  Surgeon: Signa Kell, MD;  Location: [facility];  Service: Orthopedics;;   SHOULDER ARTHROSCOPY WITH SUBACROMIAL DECOMPRESSION Left 10/08/2017   Procedure: SHOULDER ARTHROSCOPY WITH SUBACROMIAL DECOMPRESSION;  Surgeon: Signa Kell, MD;  Location: [facility];  Service: Orthopedics;  Laterality: Left;   SHOULDER OPEN ROTATOR CUFF REPAIR Left 10/08/2017   Procedure: ROTATOR CUFF REPAIR SHOULDER OPEN Extensive Glenoid Humeral debridement;  Surgeon: Signa Kell, MD;  Location: [facility];  Service: Orthopedics;  Laterality: Left;   TUBAL LIGATION  1977    Social History   Socioeconomic History   Marital status: Single    Spouse name: Not on file   Number of children: Not on file   Years of education: Not on file   Highest education level: Not on file  Occupational History   Occupation: retired  Tobacco Use   Smoking status: Every Day    Current packs/day: 1.00    Average packs/day: 1 pack/day for 45.0 years (45.0 ttl pk-yrs)    Types: Cigarettes   Smokeless tobacco: Never   Tobacco comments:    Pt reports not ready to quit after education provided.  Vaping Use   Vaping status: Never Used  Substance and Sexual Activity   Alcohol use:  Not Currently    Alcohol/week: 0.0 standard drinks of alcohol   Drug use: No   Sexual activity: Not Currently    Birth control/protection: Post-menopausal  Other Topics Concern   Not on file  Social History Narrative   Not on file   Social Determinants of Health   Financial Resource Strain: Not on file  Food Insecurity: Not on file  Transportation Needs: Not on file  Physical Activity: Not on file  Stress: Not on file  Social Connections: Not on  file  Intimate Partner Violence: Not on file    Family History  Problem Relation Age of Onset   Hypertension Mother    Osteoporosis Mother    Heart attack Father    Diabetes Brother    Breast cancer Neg Hx     Allergies  Allergen Reactions   Codeine Nausea Only   Latex     Other reaction(Kadijah Shamoon): Other (See Comments)   Paxlovid [Nirmatrelvir-Ritonavir]     Nausea and vomiting   Adhesive [Tape] Itching and Rash   Penicillins Rash    Tolerates ampicillin  Has patient had a PCN reaction causing immediate rash, facial/tongue/throat swelling, SOB or lightheadedness with hypotension: no Has patient had a PCN reaction causing severe rash involving mucus membranes or skin necrosis: no Has patient had a PCN reaction that required hospitalization {no Has patient had a PCN reaction occurring within the last 10 years: no If all of the above answers are "NO", then may proceed with Cephalosporin use.    Review of Systems  Constitutional: Negative.   HENT: Negative.    Eyes: Negative.   Respiratory: Negative.    Cardiovascular: Negative.   Gastrointestinal: Negative.   Genitourinary: Negative.   Skin: Negative.   Neurological: Negative.   Endo/Heme/Allergies: Negative.        Objective:   BP (!) 150/75   Pulse 79   Ht 5\' 5"  (1.651 m)   Wt 159 lb 3.2 oz (72.2 kg)   SpO2 95%   BMI 26.49 kg/m   Vitals:   04/17/23 1308  BP: (!) 150/75  Pulse: 79  Height: 5\' 5"  (1.651 m)  Weight: 159 lb 3.2 oz (72.2 kg)  SpO2: 95%  BMI (Calculated): 26.49    Physical Exam Vitals reviewed.  Constitutional:      General: She is not in acute distress. HENT:     Head: Normocephalic.     Nose: Nose normal.     Mouth/Throat:     Mouth: Mucous membranes are moist.  Eyes:     Extraocular Movements: Extraocular movements intact.     Pupils: Pupils are equal, round, and reactive to light.  Cardiovascular:     Rate and Rhythm: Normal rate and regular rhythm.     Heart sounds: Murmur heard.      Crescendo decrescendo systolic murmur is present with a grade of 3/6.  Pulmonary:     Effort: Pulmonary effort is normal.     Breath sounds: No rhonchi or rales.  Abdominal:     General: Abdomen is flat.     Palpations: There is no hepatomegaly, splenomegaly or mass.  Musculoskeletal:        General: Normal range of motion.     Cervical back: Normal range of motion. No tenderness.  Skin:    General: Skin is warm and dry.  Neurological:     General: No focal deficit present.     Mental Status: She is alert and oriented to person, place, and time.  Cranial Nerves: No cranial nerve deficit.     Motor: No weakness.  Psychiatric:        Mood and Affect: Mood normal.        Behavior: Behavior normal.     No results found for any visits on 04/17/23.  Recent Results (from the past 2160 hour(Arda Keadle))  Lipid panel     Status: Abnormal   Collection Time: 04/13/23 10:50 AM  Result Value Ref Range   Cholesterol, Total 95 (L) 100 - 199 mg/dL   Triglycerides 92 0 - 149 mg/dL   HDL 55 >16 mg/dL   VLDL Cholesterol Cal 18 5 - 40 mg/dL   LDL Chol Calc (NIH) 22 0 - 99 mg/dL   Chol/HDL Ratio 1.7 0.0 - 4.4 ratio    Comment:                                   T. Chol/HDL Ratio                                             Men  Women                               1/2 Avg.Risk  3.4    3.3                                   Avg.Risk  5.0    4.4                                2X Avg.Risk  9.6    7.1                                3X Avg.Risk 23.4   11.0   Comprehensive metabolic panel     Status: Abnormal   Collection Time: 04/13/23 10:50 AM  Result Value Ref Range   Glucose 102 (H) 70 - 99 mg/dL   BUN 10 8 - 27 mg/dL   Creatinine, Ser 1.09 0.57 - 1.00 mg/dL   eGFR 94 >60 AV/WUJ/8.11   BUN/Creatinine Ratio 15 12 - 28   Sodium 130 (L) 134 - 144 mmol/L   Potassium 4.2 3.5 - 5.2 mmol/L   Chloride 89 (L) 96 - 106 mmol/L   CO2 27 20 - 29 mmol/L   Calcium 9.7 8.7 - 10.3 mg/dL   Total Protein 6.9  6.0 - 8.5 g/dL   Albumin 4.7 3.9 - 4.9 g/dL   Globulin, Total 2.2 1.5 - 4.5 g/dL   Bilirubin Total 0.6 0.0 - 1.2 mg/dL   Alkaline Phosphatase 92 44 - 121 IU/L   AST 28 0 - 40 IU/L   ALT 24 0 - 32 IU/L      Assessment & Plan:  As per problem list  Problem List Items Addressed This Visit       Cardiovascular and Mediastinum   Essential hypertension - Primary     Other   Hyperlipidemia   Prediabetes    Return in about 3 months (around 07/18/2023) for fu with  labs prior.   Total time spent: 20 minutes  Luna Fuse, MD  04/17/2023   This document may have been prepared by [facility] Voice Recognition software and as such may include unintentional dictation errors.

## 2023-04-17 NOTE — Patient Instructions (Signed)
Consume 2-4 g sodium/ 24 hrs

## 2023-04-17 NOTE — Addendum Note (Signed)
Addended by: Gwynne Edinger on: 04/17/2023 01:51 PM   Modules accepted: Orders

## 2023-04-24 ENCOUNTER — Ambulatory Visit (INDEPENDENT_AMBULATORY_CARE_PROVIDER_SITE_OTHER): Payer: Medicare HMO | Admitting: Vascular Surgery

## 2023-04-24 ENCOUNTER — Ambulatory Visit (INDEPENDENT_AMBULATORY_CARE_PROVIDER_SITE_OTHER): Payer: Medicare HMO

## 2023-04-24 ENCOUNTER — Encounter (INDEPENDENT_AMBULATORY_CARE_PROVIDER_SITE_OTHER): Payer: Self-pay | Admitting: Vascular Surgery

## 2023-04-24 VITALS — BP 135/73 | HR 66 | Resp 18 | Ht 65.0 in | Wt 158.0 lb

## 2023-04-24 DIAGNOSIS — E782 Mixed hyperlipidemia: Secondary | ICD-10-CM

## 2023-04-24 DIAGNOSIS — I6523 Occlusion and stenosis of bilateral carotid arteries: Secondary | ICD-10-CM

## 2023-04-24 DIAGNOSIS — I10 Essential (primary) hypertension: Secondary | ICD-10-CM | POA: Diagnosis not present

## 2023-04-24 DIAGNOSIS — I70211 Atherosclerosis of native arteries of extremities with intermittent claudication, right leg: Secondary | ICD-10-CM

## 2023-04-24 NOTE — Assessment & Plan Note (Signed)
Carotid duplex today reveals a widely patent right carotid artery stent and stable 1 to 39% left ICA stenosis.  Continue aspirin, Plavix, and statin agent.  Recheck in 6 months.

## 2023-04-24 NOTE — Assessment & Plan Note (Signed)
ABIs today are stable at 0.77 on the right and 1.01 on the left. Claudication symptoms are stable and moderate.  She does not desire intervention and that is perfectly reasonable as she does not have limb threatening symptoms.  Continue dual antiplatelet therapy and statin agent.

## 2023-04-24 NOTE — Progress Notes (Signed)
MRN : [id number]  Bethany Lozano is a 72 y.o. ([birth year]) female who presents with chief complaint of  Chief Complaint  Patient presents with   Follow-up    6 months Carotid + ABI + consult.  Marland Kitchen  History of Present Illness: Patient returns today in follow up of multiple vascular issues.  She has previously undergone right carotid endarterectomy with subsequent right carotid artery stenting for recurrence.  This was most recently about 7 years ago.  She is doing well without any focal neurologic symptoms. Specifically, the patient denies amaurosis fugax, speech or swallowing difficulties, or arm or leg weakness or numbness.  Carotid duplex today reveals a widely patent right carotid artery stent and stable 1 to 39% left ICA stenosis. She is also followed for peripheral arterial disease.  She has stable claudication symptoms which at this time are moderate and not essentially unchanged from her previous visits.  No limb threatening symptoms such as rest pain or ulceration. ABIs today are stable at 0.77 on the right and 1.01 on the left.   Current Outpatient Medications  Medication Sig Dispense Refill   acetaminophen (TYLENOL) 500 MG tablet Take 500 mg by mouth every 6 (six) hours as needed for moderate pain.     aspirin 81 MG EC tablet Take 81 mg by mouth daily.     chlorthalidone (HYGROTON) 25 MG tablet TAKE 1 TABLET (25 MG TOTAL) BY MOUTH DAILY. 90 tablet 1   Cholecalciferol (VITAMIN D3) 5000 units CAPS Take 1,000 Units by mouth daily.      clonazePAM (KLONOPIN) 0.5 MG tablet Take 1 tablet (0.5 mg total) by mouth daily. 30 tablet 2   clopidogrel (PLAVIX) 75 MG tablet TAKE 1 TABLET BY MOUTH EVERY DAY 90 tablet 3   cyanocobalamin (VITAMIN B12) 100 MCG tablet Take 100 mcg by mouth once a week.     DULoxetine (CYMBALTA) 60 MG capsule TAKE 1 CAPSULE BY MOUTH EVERY DAY 90 capsule 1   estradiol (ESTRACE) 0.5 MG tablet TAKE 1 TABLET BY MOUTH EVERY DAY 90 tablet 1   Evolocumab (REPATHA SURECLICK)  140 MG/ML SOAJ INJECT 1 INJECTION EVERY 2 WEEKS 6 mL 1   fluticasone (FLONASE) 50 MCG/ACT nasal spray Place 1 spray into both nostrils daily as needed for allergies or rhinitis.   0   ondansetron (ZOFRAN) 4 MG tablet Take 1 tablet (4 mg total) by mouth every 6 (six) hours as needed for nausea. 20 tablet 0   rosuvastatin (CRESTOR) 10 MG tablet TAKE 1 TABLET BY MOUTH EVERY DAY 90 tablet 1   valsartan (DIOVAN) 80 MG tablet TAKE 1 TABLET BY MOUTH EVERY MORNING 90 tablet 1   No current facility-administered medications for this visit.    Past Medical History:  Diagnosis Date   Anxiety    Depression    GERD (gastroesophageal reflux disease)    Heart murmur    Hemorrhoids    Hyperlipidemia    Peripheral vascular disease (HCC)    Sinusitis    Tobacco use     Past Surgical History:  Procedure Laterality Date   ABDOMINAL HYSTERECTOMY  1991   APPENDECTOMY     BICEPT TENODESIS Left 10/08/2017   Procedure: BICEPS TENODESIS;  Surgeon: Signa Kell, MD;  Location: [facility];  Service: Orthopedics;  Laterality: Left;   CARDIAC CATHETERIZATION N/A 11/08/2015   Procedure: Left Heart Cath and Coronary Angiography;  Surgeon: Laurier Nancy, MD;  Location: [facility];  Service: Cardiovascular;  Laterality: N/A;  CARDIAC CATHETERIZATION     Mynx placed in right artery after heart catherization   CAROTID STENT INSERTION Right 05/15/2016   COLONOSCOPY  2014   COLONOSCOPY N/A 12/27/2021   Procedure: COLONOSCOPY;  Surgeon: Regis Bill, MD;  Location: [facility];  Service: Endoscopy;  Laterality: N/A;   ENDARTERECTOMY Right 11/25/2015   Procedure: ENDARTERECTOMY CAROTID;  Surgeon: Annice Needy, MD;  Location: [facility];  Service: Vascular;  Laterality: Right;   ESOPHAGOGASTRODUODENOSCOPY N/A 12/27/2021   Procedure: ESOPHAGOGASTRODUODENOSCOPY (EGD);  Surgeon: Regis Bill, MD;  Location: [facility] ENDOSCOPY;  Service: Endoscopy;  Laterality: N/A;   FISSURECTOMY  10/22/14   HEMORRHOIDECTOMY  WITH HEMORRHOID BANDING  1991,06/20/14, 10/22/14   PERIPHERAL VASCULAR CATHETERIZATION Right 05/15/2016   Procedure: Carotid PTA/Stent Intervention;  Surgeon: Annice Needy, MD;  Location: [facility];  Service: Cardiovascular;  Laterality: Right;   RESECTION DISTAL CLAVICAL  10/08/2017   Procedure: RESECTION DISTAL CLAVICAL;  Surgeon: Signa Kell, MD;  Location: [facility];  Service: Orthopedics;;   SHOULDER ARTHROSCOPY WITH SUBACROMIAL DECOMPRESSION Left 10/08/2017   Procedure: SHOULDER ARTHROSCOPY WITH SUBACROMIAL DECOMPRESSION;  Surgeon: Signa Kell, MD;  Location: [facility];  Service: Orthopedics;  Laterality: Left;   SHOULDER OPEN ROTATOR CUFF REPAIR Left 10/08/2017   Procedure: ROTATOR CUFF REPAIR SHOULDER OPEN Extensive Glenoid Humeral debridement;  Surgeon: Signa Kell, MD;  Location: [facility];  Service: Orthopedics;  Laterality: Left;   TUBAL LIGATION  1977     Social History   Tobacco Use   Smoking status: Every Day    Current packs/day: 1.00    Average packs/day: 1 pack/day for 45.0 years (45.0 ttl pk-yrs)    Types: Cigarettes   Smokeless tobacco: Never   Tobacco comments:    Pt reports not ready to quit after education provided.  Vaping Use   Vaping status: Never Used  Substance Use Topics   Alcohol use: Not Currently    Alcohol/week: 0.0 standard drinks of alcohol   Drug use: No      Family History  Problem Relation Age of Onset   Hypertension Mother    Osteoporosis Mother    Heart attack Father    Diabetes Brother    Breast cancer Neg Hx      Allergies  Allergen Reactions   Codeine Nausea Only   Latex     Other reaction(s): Other (See Comments)   Paxlovid [Nirmatrelvir-Ritonavir]     Nausea and vomiting   Adhesive [Tape] Itching and Rash   Penicillins Rash    Tolerates ampicillin  Has patient had a PCN reaction causing immediate rash, facial/tongue/throat swelling, SOB or lightheadedness with hypotension: no Has patient had a PCN reaction causing  severe rash involving mucus membranes or skin necrosis: no Has patient had a PCN reaction that required hospitalization {no Has patient had a PCN reaction occurring within the last 10 years: no If all of the above answers are "NO", then may proceed with Cephalosporin use.    REVIEW OF SYSTEMS (Negative unless checked)   Constitutional: [] Weight loss  [] Fever  [] Chills Cardiac: [] Chest pain   [] Chest pressure   [] Palpitations   [] Shortness of breath when laying flat   [] Shortness of breath at rest   [] Shortness of breath with exertion. Vascular:  [x] Pain in legs with walking   [] Pain in legs at rest   [] Pain in legs when laying flat   [x] Claudication   [] Pain in feet when walking  [] Pain in feet at rest  [] Pain in feet when  laying flat   [] History of DVT   [] Phlebitis   [] Swelling in legs   [] Varicose veins   [] Non-healing ulcers Pulmonary:   [] Uses home oxygen   [] Productive cough   [] Hemoptysis   [] Wheeze  [] COPD   [] Asthma Neurologic:  [x] Dizziness  [] Blackouts   [] Seizures   [] History of stroke   [] History of TIA  [] Aphasia   [x] Temporary blindness   [] Dysphagia   [] Weakness or numbness in arms   [x] Weakness or numbness in legs Musculoskeletal:  [] Arthritis   [] Joint swelling   [] Joint pain   [] Low back pain Hematologic:  [] Easy bruising  [] Easy bleeding   [] Hypercoagulable state   [] Anemic  [] Hepatitis Gastrointestinal:  [] Blood in stool   [] Vomiting blood  [] Gastroesophageal reflux/heartburn   [] Difficulty swallowing. Genitourinary:  [] Chronic kidney disease   [] Difficult urination  [] Frequent urination  [] Burning with urination   [] Blood in urine Skin:  [] Rashes   [] Ulcers   [] Wounds Psychological:  [x] History of anxiety   []  History of major depression   Physical Examination  BP 135/73 (BP Location: Left Arm)   Pulse 66   Resp 18   Ht 5\' 5"  (1.651 m)   Wt 158 lb (71.7 kg)   BMI 26.29 kg/m  Gen:  WD/WN, NAD Head: [place]/AT, No temporalis wasting. Ear/Nose/Throat: Hearing grossly  intact, nares w/o erythema or drainage Eyes: Conjunctiva clear. Sclera non-icteric Neck: Supple.  Trachea midline Pulmonary:  Good air movement, no use of accessory muscles.  Cardiac: RRR, no JVD Vascular:  Vessel Right Left  Radial Palpable Palpable                          PT 1+ Palpable 2+ Palpable  DP 1+ Palpable 1+ Palpable   Gastrointestinal: soft, non-tender/non-distended. No guarding/reflex.  Musculoskeletal: M/S 5/5 throughout.  No deformity or atrophy. No edema. Neurologic: Sensation grossly intact in extremities.  Symmetrical.  Speech is fluent.  Psychiatric: Judgment intact, Mood & affect appropriate for pt's clinical situation. Dermatologic: No rashes or ulcers noted.  No cellulitis or open wounds.      Labs Recent Results (from the past 2160 hour(s))  Lipid panel     Status: Abnormal   Collection Time: 04/13/23 10:50 AM  Result Value Ref Range   Cholesterol, Total 95 (L) 100 - 199 mg/dL   Triglycerides 92 0 - 149 mg/dL   HDL 55 >54 mg/dL   VLDL Cholesterol Cal 18 5 - 40 mg/dL   LDL Chol Calc (NIH) 22 0 - 99 mg/dL   Chol/HDL Ratio 1.7 0.0 - 4.4 ratio    Comment:                                   T. Chol/HDL Ratio                                             Men  Women                               1/2 Avg.Risk  3.4    3.3  Avg.Risk  5.0    4.4                                2X Avg.Risk  9.6    7.1                                3X Avg.Risk 23.4   11.0   Comprehensive metabolic panel     Status: Abnormal   Collection Time: 04/13/23 10:50 AM  Result Value Ref Range   Glucose 102 (H) 70 - 99 mg/dL   BUN 10 8 - 27 mg/dL   Creatinine, Ser 8.65 0.57 - 1.00 mg/dL   eGFR 94 >78 IO/NGE/9.52   BUN/Creatinine Ratio 15 12 - 28   Sodium 130 (L) 134 - 144 mmol/L   Potassium 4.2 3.5 - 5.2 mmol/L   Chloride 89 (L) 96 - 106 mmol/L   CO2 27 20 - 29 mmol/L   Calcium 9.7 8.7 - 10.3 mg/dL   Total Protein 6.9 6.0 - 8.5 g/dL   Albumin  4.7 3.9 - 4.9 g/dL   Globulin, Total 2.2 1.5 - 4.5 g/dL   Bilirubin Total 0.6 0.0 - 1.2 mg/dL   Alkaline Phosphatase 92 44 - 121 IU/L   AST 28 0 - 40 IU/L   ALT 24 0 - 32 IU/L    Radiology No results found.  Assessment/Plan Essential hypertension blood pressure control important in reducing the progression of atherosclerotic disease. On appropriate oral medications.     Hyperlipidemia lipid control important in reducing the progression of atherosclerotic disease. Continue statin therapy  Carotid stenosis Carotid duplex today reveals a widely patent right carotid artery stent and stable 1 to 39% left ICA stenosis.  Continue aspirin, Plavix, and statin agent.  Recheck in 6 months.  Atherosclerosis of native arteries of extremity with intermittent claudication (HCC) ABIs today are stable at 0.77 on the right and 1.01 on the left. Claudication symptoms are stable and moderate.  She does not desire intervention and that is perfectly reasonable as she does not have limb threatening symptoms.  Continue dual antiplatelet therapy and statin agent.    Festus Barren, MD  04/24/2023 4:17 PM    This note was created with Dragon medical transcription system.  Any errors from dictation are purely unintentional

## 2023-04-26 LAB — VAS US ABI WITH/WO TBI
Left ABI: 1.01
Right ABI: 0.77

## 2023-05-01 DIAGNOSIS — H43813 Vitreous degeneration, bilateral: Secondary | ICD-10-CM | POA: Diagnosis not present

## 2023-05-01 DIAGNOSIS — H2513 Age-related nuclear cataract, bilateral: Secondary | ICD-10-CM | POA: Diagnosis not present

## 2023-06-01 ENCOUNTER — Other Ambulatory Visit: Payer: Self-pay | Admitting: Cardiovascular Disease

## 2023-06-01 DIAGNOSIS — I2510 Atherosclerotic heart disease of native coronary artery without angina pectoris: Secondary | ICD-10-CM

## 2023-06-04 ENCOUNTER — Other Ambulatory Visit: Payer: Self-pay | Admitting: Internal Medicine

## 2023-06-04 DIAGNOSIS — M2550 Pain in unspecified joint: Secondary | ICD-10-CM

## 2023-06-07 ENCOUNTER — Other Ambulatory Visit: Payer: Self-pay | Admitting: Internal Medicine

## 2023-06-07 DIAGNOSIS — E785 Hyperlipidemia, unspecified: Secondary | ICD-10-CM

## 2023-06-07 DIAGNOSIS — N951 Menopausal and female climacteric states: Secondary | ICD-10-CM

## 2023-06-07 DIAGNOSIS — F418 Other specified anxiety disorders: Secondary | ICD-10-CM

## 2023-06-07 DIAGNOSIS — I10 Essential (primary) hypertension: Secondary | ICD-10-CM

## 2023-06-14 ENCOUNTER — Other Ambulatory Visit: Payer: Self-pay | Admitting: Internal Medicine

## 2023-06-27 ENCOUNTER — Other Ambulatory Visit: Payer: Self-pay | Admitting: Cardiovascular Disease

## 2023-06-27 DIAGNOSIS — I2510 Atherosclerotic heart disease of native coronary artery without angina pectoris: Secondary | ICD-10-CM

## 2023-07-20 ENCOUNTER — Other Ambulatory Visit: Payer: Medicare HMO

## 2023-07-20 DIAGNOSIS — E782 Mixed hyperlipidemia: Secondary | ICD-10-CM

## 2023-07-21 LAB — COMPREHENSIVE METABOLIC PANEL
ALT: 29 [IU]/L (ref 0–32)
AST: 37 [IU]/L (ref 0–40)
Albumin: 5 g/dL — ABNORMAL HIGH (ref 3.8–4.8)
Alkaline Phosphatase: 96 [IU]/L (ref 44–121)
BUN/Creatinine Ratio: 13 (ref 12–28)
BUN: 11 mg/dL (ref 8–27)
Bilirubin Total: 0.5 mg/dL (ref 0.0–1.2)
CO2: 25 mmol/L (ref 20–29)
Calcium: 10.2 mg/dL (ref 8.7–10.3)
Chloride: 98 mmol/L (ref 96–106)
Creatinine, Ser: 0.83 mg/dL (ref 0.57–1.00)
Globulin, Total: 2.6 g/dL (ref 1.5–4.5)
Glucose: 109 mg/dL — ABNORMAL HIGH (ref 70–99)
Potassium: 4.1 mmol/L (ref 3.5–5.2)
Sodium: 141 mmol/L (ref 134–144)
Total Protein: 7.6 g/dL (ref 6.0–8.5)
eGFR: 75 mL/min/{1.73_m2} (ref >59)

## 2023-07-21 LAB — LIPID PANEL
Chol/HDL Ratio: 1.8 {ratio} (ref 0.0–4.4)
Cholesterol, Total: 109 mg/dL (ref 100–199)
HDL: 60 mg/dL (ref >39)
LDL Chol Calc (NIH): 29 mg/dL (ref 0–99)
Triglycerides: 109 mg/dL (ref 0–149)
VLDL Cholesterol Cal: 20 mg/dL (ref 5–40)

## 2023-07-21 LAB — CK: Total CK: 142 U/L (ref 32–182)

## 2023-07-25 ENCOUNTER — Ambulatory Visit: Payer: Medicare HMO | Admitting: Internal Medicine

## 2023-07-26 ENCOUNTER — Ambulatory Visit (INDEPENDENT_AMBULATORY_CARE_PROVIDER_SITE_OTHER): Payer: Medicare HMO | Admitting: Cardiovascular Disease

## 2023-07-26 ENCOUNTER — Encounter: Payer: Self-pay | Admitting: Cardiovascular Disease

## 2023-07-26 VITALS — BP 124/62 | HR 97 | Ht 65.0 in | Wt 158.0 lb

## 2023-07-26 DIAGNOSIS — I259 Chronic ischemic heart disease, unspecified: Secondary | ICD-10-CM

## 2023-07-26 DIAGNOSIS — I739 Peripheral vascular disease, unspecified: Secondary | ICD-10-CM | POA: Diagnosis not present

## 2023-07-26 DIAGNOSIS — E782 Mixed hyperlipidemia: Secondary | ICD-10-CM | POA: Diagnosis not present

## 2023-07-26 DIAGNOSIS — I6523 Occlusion and stenosis of bilateral carotid arteries: Secondary | ICD-10-CM

## 2023-07-26 DIAGNOSIS — Z720 Tobacco use: Secondary | ICD-10-CM | POA: Diagnosis not present

## 2023-07-26 DIAGNOSIS — Z0130 Encounter for examination of blood pressure without abnormal findings: Secondary | ICD-10-CM

## 2023-07-26 DIAGNOSIS — I70211 Atherosclerosis of native arteries of extremities with intermittent claudication, right leg: Secondary | ICD-10-CM | POA: Diagnosis not present

## 2023-07-26 DIAGNOSIS — F17210 Nicotine dependence, cigarettes, uncomplicated: Secondary | ICD-10-CM | POA: Diagnosis not present

## 2023-07-26 NOTE — Assessment & Plan Note (Signed)
Had chest pain this morning anginal type , advise echo, stress test

## 2023-07-26 NOTE — Progress Notes (Signed)
Cardiology Office Note   Date:  07/26/2023   ID:  CORAH WURZEL, DOB 05-06-1951, MRN [id number]  PCP:  Sherron Monday, MD  Cardiologist:  Adrian Blackwater, MD      History of Present Illness: Bethany Lozano is a 73 y.o. female who presents for  Chief Complaint  Patient presents with   Follow-up    6 Months Follow Up    Had tightness in chest this morning  Chest Pain  This is a new problem. The current episode started today. The onset quality is sudden. The problem has been resolved. The pain is at a severity of 6/10. The quality of the pain is described as tightness.      Past Medical History:  Diagnosis Date   Anxiety    Depression    GERD (gastroesophageal reflux disease)    Heart murmur    Hemorrhoids    Hyperlipidemia    Peripheral vascular disease (HCC)    Sinusitis    Tobacco use      Past Surgical History:  Procedure Laterality Date   ABDOMINAL HYSTERECTOMY  1991   APPENDECTOMY     BICEPT TENODESIS Left 10/08/2017   Procedure: BICEPS TENODESIS;  Surgeon: Signa Kell, MD;  Location: [facility];  Service: Orthopedics;  Laterality: Left;   CARDIAC CATHETERIZATION N/A 11/08/2015   Procedure: Left Heart Cath and Coronary Angiography;  Surgeon: Laurier Nancy, MD;  Location: [facility];  Service: Cardiovascular;  Laterality: N/A;   CARDIAC CATHETERIZATION     Mynx placed in right artery after heart catherization   CAROTID STENT INSERTION Right 05/15/2016   COLONOSCOPY  2014   COLONOSCOPY N/A 12/27/2021   Procedure: COLONOSCOPY;  Surgeon: Regis Bill, MD;  Location: [facility];  Service: Endoscopy;  Laterality: N/A;   ENDARTERECTOMY Right 11/25/2015   Procedure: ENDARTERECTOMY CAROTID;  Surgeon: Annice Needy, MD;  Location: [facility];  Service: Vascular;  Laterality: Right;   ESOPHAGOGASTRODUODENOSCOPY N/A 12/27/2021   Procedure: ESOPHAGOGASTRODUODENOSCOPY (EGD);  Surgeon: Regis Bill, MD;  Location: [facility];  Service: Endoscopy;   Laterality: N/A;   FISSURECTOMY  10/22/14   HEMORRHOIDECTOMY WITH HEMORRHOID BANDING  1991,06/20/14, 10/22/14   PERIPHERAL VASCULAR CATHETERIZATION Right 05/15/2016   Procedure: Carotid PTA/Stent Intervention;  Surgeon: Annice Needy, MD;  Location: [facility];  Service: Cardiovascular;  Laterality: Right;   RESECTION DISTAL CLAVICAL  10/08/2017   Procedure: RESECTION DISTAL CLAVICAL;  Surgeon: Signa Kell, MD;  Location: [facility];  Service: Orthopedics;;   SHOULDER ARTHROSCOPY WITH SUBACROMIAL DECOMPRESSION Left 10/08/2017   Procedure: SHOULDER ARTHROSCOPY WITH SUBACROMIAL DECOMPRESSION;  Surgeon: Signa Kell, MD;  Location: [facility];  Service: Orthopedics;  Laterality: Left;   SHOULDER OPEN ROTATOR CUFF REPAIR Left 10/08/2017   Procedure: ROTATOR CUFF REPAIR SHOULDER OPEN Extensive Glenoid Humeral debridement;  Surgeon: Signa Kell, MD;  Location: [facility];  Service: Orthopedics;  Laterality: Left;   TUBAL LIGATION  1977     Current Outpatient Medications  Medication Sig Dispense Refill   acetaminophen (TYLENOL) 500 MG tablet Take 500 mg by mouth every 6 (six) hours as needed for moderate pain.     aspirin 81 MG EC tablet Take 81 mg by mouth daily.     celecoxib (CELEBREX) 200 MG capsule TAKE 1 CAPSULE (200 MG TOTAL) BY MOUTH DAILY. AS NEEDED FOR JOINT PAIN 30 capsule 1   chlorthalidone (HYGROTON) 25 MG tablet TAKE 1 TABLET (25 MG TOTAL) BY MOUTH DAILY. 90 tablet 1  Cholecalciferol (VITAMIN D3) 5000 units CAPS Take 1,000 Units by mouth daily.      clonazePAM (KLONOPIN) 0.5 MG tablet TAKE 1 TABLET BY MOUTH EVERY DAY 30 tablet 2   clopidogrel (PLAVIX) 75 MG tablet TAKE 1 TABLET BY MOUTH EVERY DAY 90 tablet 3   cyanocobalamin (VITAMIN B12) 100 MCG tablet Take 100 mcg by mouth once a week.     DULoxetine (CYMBALTA) 60 MG capsule TAKE 1 CAPSULE BY MOUTH EVERY DAY 90 capsule 1   estradiol (ESTRACE) 0.5 MG tablet TAKE 1 TABLET BY MOUTH EVERY DAY 90 tablet 1   Evolocumab (REPATHA SURECLICK)  140 MG/ML SOAJ INJECT 1 INJECTION EVERY 2 WEEKS 90 mL 1   fluticasone (FLONASE) 50 MCG/ACT nasal spray Place 1 spray into both nostrils daily as needed for allergies or rhinitis.   0   ondansetron (ZOFRAN) 4 MG tablet Take 1 tablet (4 mg total) by mouth every 6 (six) hours as needed for nausea. 20 tablet 0   rosuvastatin (CRESTOR) 10 MG tablet TAKE 1 TABLET BY MOUTH EVERY DAY 90 tablet 1   valsartan (DIOVAN) 80 MG tablet TAKE 1 TABLET BY MOUTH EVERY DAY IN THE MORNING 90 tablet 1   No current facility-administered medications for this visit.    Allergies:   Codeine, Latex, Paxlovid [nirmatrelvir-ritonavir], Adhesive [tape], and Penicillins    Social History:   reports that she has been smoking cigarettes. She has a 45 pack-year smoking history. She has never used smokeless tobacco. She reports that she does not currently use alcohol. She reports that she does not use drugs.   Family History:  family history includes Diabetes in her brother; Heart attack in her father; Hypertension in her mother; Osteoporosis in her mother.    ROS:     Review of Systems  Constitutional: Negative.   HENT: Negative.    Eyes: Negative.   Respiratory: Negative.    Cardiovascular:  Positive for chest pain.  Gastrointestinal: Negative.   Genitourinary: Negative.   Musculoskeletal: Negative.   Skin: Negative.   Neurological: Negative.   Endo/Heme/Allergies: Negative.   Psychiatric/Behavioral: Negative.    All other systems reviewed and are negative.     All other systems are reviewed and negative.    PHYSICAL EXAM: VS:  BP 124/62   Pulse 97   Ht 5\' 5"  (1.651 m)   Wt 158 lb (71.7 kg)   SpO2 97%   BMI 26.29 kg/m  , BMI Body mass index is 26.29 kg/m. Last weight:  Wt Readings from Last 3 Encounters:  07/26/23 158 lb (71.7 kg)  04/24/23 158 lb (71.7 kg)  04/17/23 159 lb 3.2 oz (72.2 kg)     Physical Exam Constitutional:      Appearance: Normal appearance.  Cardiovascular:     Rate and  Rhythm: Normal rate and regular rhythm.     Heart sounds: Normal heart sounds.  Pulmonary:     Effort: Pulmonary effort is normal.     Breath sounds: Normal breath sounds.  Musculoskeletal:     Right lower leg: No edema.     Left lower leg: No edema.  Neurological:     Mental Status: She is alert.       EKG:   Recent Labs: 01/09/2023: Hemoglobin 14.2; Platelets 237 07/20/2023: ALT 29; BUN 11; Creatinine, Ser 0.83; Potassium 4.1; Sodium 141    Lipid Panel    Component Value Date/Time   CHOL 109 07/20/2023 1028   TRIG 109 07/20/2023 1028   HDL  60 07/20/2023 1028   CHOLHDL 1.8 07/20/2023 1028   LDLCALC 29 07/20/2023 1028      Other studies Reviewed: Additional studies/ records that were reviewed today include:  Review of the above records demonstrates:       No data to display            ASSESSMENT AND PLAN:    ICD-10-CM   1. Bilateral carotid artery stenosis  I65.23 PCV ECHOCARDIOGRAM COMPLETE    MYOCARDIAL PERFUSION IMAGING    2. Atherosclerosis of native artery of right lower extremity with intermittent claudication (HCC)  I70.211 PCV ECHOCARDIOGRAM COMPLETE    MYOCARDIAL PERFUSION IMAGING    3. Peripheral vascular disease (HCC)  I73.9 PCV ECHOCARDIOGRAM COMPLETE    MYOCARDIAL PERFUSION IMAGING    4. Mixed hyperlipidemia  E78.2 PCV ECHOCARDIOGRAM COMPLETE    MYOCARDIAL PERFUSION IMAGING    5. Tobacco use  Z72.0 PCV ECHOCARDIOGRAM COMPLETE    MYOCARDIAL PERFUSION IMAGING   continue to smoke    6. Chest pain due to myocardial ischemia, unspecified ischemic chest pain type  I25.9 PCV ECHOCARDIOGRAM COMPLETE    MYOCARDIAL PERFUSION IMAGING   Had chest pain and SOb, advise echo and stress test       Problem List Items Addressed This Visit       Cardiovascular and Mediastinum   Carotid stenosis - Primary   Relevant Orders   PCV ECHOCARDIOGRAM COMPLETE   MYOCARDIAL PERFUSION IMAGING   Atherosclerosis of native arteries of extremity with intermittent  claudication (HCC)   Relevant Orders   PCV ECHOCARDIOGRAM COMPLETE   MYOCARDIAL PERFUSION IMAGING   Peripheral vascular disease (HCC)   Relevant Orders   PCV ECHOCARDIOGRAM COMPLETE   MYOCARDIAL PERFUSION IMAGING     Other   Hyperlipidemia   Relevant Orders   PCV ECHOCARDIOGRAM COMPLETE   MYOCARDIAL PERFUSION IMAGING   Tobacco use   Relevant Orders   PCV ECHOCARDIOGRAM COMPLETE   MYOCARDIAL PERFUSION IMAGING   Chest pain   Had chest pain this morning anginal type , advise echo, stress test      Relevant Orders   PCV ECHOCARDIOGRAM COMPLETE   MYOCARDIAL PERFUSION IMAGING       Disposition:   Return in about 2 weeks (around 08/09/2023) for echo, stress test and f/u.    Total time spent: 40 minutes  Signed,  Adrian Blackwater, MD  07/26/2023 1:44 PM    [facility]

## 2023-07-31 ENCOUNTER — Encounter: Payer: Self-pay | Admitting: Internal Medicine

## 2023-07-31 NOTE — Progress Notes (Signed)
Informed via Mychart message

## 2023-08-01 ENCOUNTER — Ambulatory Visit (INDEPENDENT_AMBULATORY_CARE_PROVIDER_SITE_OTHER): Payer: Medicare HMO

## 2023-08-01 DIAGNOSIS — I70211 Atherosclerosis of native arteries of extremities with intermittent claudication, right leg: Secondary | ICD-10-CM

## 2023-08-01 DIAGNOSIS — I340 Nonrheumatic mitral (valve) insufficiency: Secondary | ICD-10-CM | POA: Diagnosis not present

## 2023-08-01 DIAGNOSIS — Z720 Tobacco use: Secondary | ICD-10-CM

## 2023-08-01 DIAGNOSIS — I259 Chronic ischemic heart disease, unspecified: Secondary | ICD-10-CM

## 2023-08-01 DIAGNOSIS — I6523 Occlusion and stenosis of bilateral carotid arteries: Secondary | ICD-10-CM

## 2023-08-01 DIAGNOSIS — E782 Mixed hyperlipidemia: Secondary | ICD-10-CM

## 2023-08-01 DIAGNOSIS — I739 Peripheral vascular disease, unspecified: Secondary | ICD-10-CM

## 2023-08-02 ENCOUNTER — Ambulatory Visit (INDEPENDENT_AMBULATORY_CARE_PROVIDER_SITE_OTHER): Payer: Medicare HMO

## 2023-08-02 DIAGNOSIS — Z720 Tobacco use: Secondary | ICD-10-CM

## 2023-08-02 DIAGNOSIS — I739 Peripheral vascular disease, unspecified: Secondary | ICD-10-CM

## 2023-08-02 DIAGNOSIS — I259 Chronic ischemic heart disease, unspecified: Secondary | ICD-10-CM | POA: Diagnosis not present

## 2023-08-02 DIAGNOSIS — I6523 Occlusion and stenosis of bilateral carotid arteries: Secondary | ICD-10-CM | POA: Diagnosis not present

## 2023-08-02 DIAGNOSIS — I70211 Atherosclerosis of native arteries of extremities with intermittent claudication, right leg: Secondary | ICD-10-CM

## 2023-08-02 DIAGNOSIS — E782 Mixed hyperlipidemia: Secondary | ICD-10-CM

## 2023-08-03 MED ORDER — TECHNETIUM TC 99M SESTAMIBI GENERIC - CARDIOLITE
10.2000 | Freq: Once | INTRAVENOUS | Status: AC | PRN
  Administered 2023-08-02: 10.2 via INTRAVENOUS

## 2023-08-03 MED ORDER — TECHNETIUM TC 99M SESTAMIBI GENERIC - CARDIOLITE
32.0000 | Freq: Once | INTRAVENOUS | Status: AC | PRN
  Administered 2023-08-02: 32 via INTRAVENOUS

## 2023-08-09 ENCOUNTER — Encounter: Payer: Self-pay | Admitting: Cardiovascular Disease

## 2023-08-09 ENCOUNTER — Ambulatory Visit (INDEPENDENT_AMBULATORY_CARE_PROVIDER_SITE_OTHER): Payer: Medicare HMO | Admitting: Cardiovascular Disease

## 2023-08-09 VITALS — BP 122/73 | HR 70 | Ht 65.0 in | Wt 155.4 lb

## 2023-08-09 DIAGNOSIS — F17209 Nicotine dependence, unspecified, with unspecified nicotine-induced disorders: Secondary | ICD-10-CM | POA: Diagnosis not present

## 2023-08-09 DIAGNOSIS — I70211 Atherosclerosis of native arteries of extremities with intermittent claudication, right leg: Secondary | ICD-10-CM

## 2023-08-09 DIAGNOSIS — I739 Peripheral vascular disease, unspecified: Secondary | ICD-10-CM

## 2023-08-09 DIAGNOSIS — E782 Mixed hyperlipidemia: Secondary | ICD-10-CM | POA: Diagnosis not present

## 2023-08-09 DIAGNOSIS — R0789 Other chest pain: Secondary | ICD-10-CM

## 2023-08-09 DIAGNOSIS — I6523 Occlusion and stenosis of bilateral carotid arteries: Secondary | ICD-10-CM | POA: Diagnosis not present

## 2023-08-09 DIAGNOSIS — I6521 Occlusion and stenosis of right carotid artery: Secondary | ICD-10-CM

## 2023-08-09 NOTE — Progress Notes (Signed)
 Cardiology Office Note   Date:  08/09/2023   ID:  Bethany Lozano, DOB [birth year], MRN [id number]  PCP:  Albina GORMAN Dine, MD  Cardiologist:  Denyse Bathe, MD      History of Present Illness: Bethany Lozano is a 73 y.o. female who presents for  Chief Complaint  Patient presents with   Follow-up    Echo nst results    Doing well      Past Medical History:  Diagnosis Date   Anxiety    Depression    GERD (gastroesophageal reflux disease)    Heart murmur    Hemorrhoids    Hyperlipidemia    Peripheral vascular disease (HCC)    Sinusitis    Tobacco use      Past Surgical History:  Procedure Laterality Date   ABDOMINAL HYSTERECTOMY  1991   APPENDECTOMY     BICEPT TENODESIS Left 10/08/2017   Procedure: BICEPS TENODESIS;  Surgeon: Tobie Priest, MD;  Location: [facility];  Service: Orthopedics;  Laterality: Left;   CARDIAC CATHETERIZATION N/A 11/08/2015   Procedure: Left Heart Cath and Coronary Angiography;  Surgeon: Denyse DELENA Bathe, MD;  Location: [facility];  Service: Cardiovascular;  Laterality: N/A;   CARDIAC CATHETERIZATION     Mynx placed in right artery after heart catherization   CAROTID STENT INSERTION Right 05/15/2016   COLONOSCOPY  2014   COLONOSCOPY N/A 12/27/2021   Procedure: COLONOSCOPY;  Surgeon: Maryruth Ole DASEN, MD;  Location: [facility];  Service: Endoscopy;  Laterality: N/A;   ENDARTERECTOMY Right 11/25/2015   Procedure: ENDARTERECTOMY CAROTID;  Surgeon: Selinda GORMAN Gu, MD;  Location: [facility];  Service: Vascular;  Laterality: Right;   ESOPHAGOGASTRODUODENOSCOPY N/A 12/27/2021   Procedure: ESOPHAGOGASTRODUODENOSCOPY (EGD);  Surgeon: Maryruth Ole DASEN, MD;  Location[facility];  Service: Endoscopy;  Laterality: N/A;   FISSURECTOMY  10/22/14   HEMORRHOIDECTOMY WITH HEMORRHOID BANDING  1991,06/20/14, 10/22/14   PERIPHERAL VASCULAR CATHETERIZATION Right 05/15/2016   Procedure: Carotid PTA/Stent Intervention;  Surgeon: Selinda GORMAN Gu, MD;   Location: [facility];  Service: Cardiovascular;  Laterality: Right;   RESECTION DISTAL CLAVICAL  10/08/2017   Procedure: RESECTION DISTAL CLAVICAL;  Surgeon: Tobie Priest, MD;  Location: [facility];  Service: Orthopedics;;   SHOULDER ARTHROSCOPY WITH SUBACROMIAL DECOMPRESSION Left 10/08/2017   Procedure: SHOULDER ARTHROSCOPY WITH SUBACROMIAL DECOMPRESSION;  Surgeon: Tobie Priest, MD;  Location: [facility];  Service: Orthopedics;  Laterality: Left;   SHOULDER OPEN ROTATOR CUFF REPAIR Left 10/08/2017   Procedure: ROTATOR CUFF REPAIR SHOULDER OPEN Extensive Glenoid Humeral debridement;  Surgeon: Tobie Priest, MD;  Location: [facility];  Service: Orthopedics;  Laterality: Left;   TUBAL LIGATION  1977     Current Outpatient Medications  Medication Sig Dispense Refill   acetaminophen  (TYLENOL ) 500 MG tablet Take 500 mg by mouth every 6 (six) hours as needed for moderate pain.     aspirin  81 MG EC tablet Take 81 mg by mouth daily.     chlorthalidone  (HYGROTON ) 25 MG tablet TAKE 1 TABLET (25 MG TOTAL) BY MOUTH DAILY. 90 tablet 1   Cholecalciferol  (VITAMIN D3) 5000 units CAPS Take 1,000 Units by mouth daily.      clonazePAM  (KLONOPIN ) 0.5 MG tablet TAKE 1 TABLET BY MOUTH EVERY DAY 30 tablet 2   clopidogrel  (PLAVIX ) 75 MG tablet TAKE 1 TABLET BY MOUTH EVERY DAY 90 tablet 3   cyanocobalamin (VITAMIN B12) 100 MCG tablet Take 100 mcg by mouth once a week.  DULoxetine  (CYMBALTA ) 60 MG capsule TAKE 1 CAPSULE BY MOUTH EVERY DAY 90 capsule 1   estradiol (ESTRACE) 0.5 MG tablet TAKE 1 TABLET BY MOUTH EVERY DAY 90 tablet 1   Evolocumab (REPATHA SURECLICK) 140 MG/ML SOAJ INJECT 1 INJECTION EVERY 2 WEEKS 90 mL 1   fluticasone  (FLONASE ) 50 MCG/ACT nasal spray Place 1 spray into both nostrils daily as needed for allergies or rhinitis.   0   ondansetron  (ZOFRAN ) 4 MG tablet Take 1 tablet (4 mg total) by mouth every 6 (six) hours as needed for nausea. 20 tablet 0   rosuvastatin  (CRESTOR ) 10 MG tablet TAKE 1 TABLET  BY MOUTH EVERY DAY 90 tablet 1   valsartan (DIOVAN) 80 MG tablet TAKE 1 TABLET BY MOUTH EVERY DAY IN THE MORNING 90 tablet 1   No current facility-administered medications for this visit.    Allergies:   Codeine, Latex, Paxlovid  [nirmatrelvir-ritonavir], Adhesive [tape], and Penicillins    Social History:   reports that she has been smoking cigarettes. She has a 45 pack-year smoking history. She has never used smokeless tobacco. She reports that she does not currently use alcohol. She reports that she does not use drugs.   Family History:  family history includes Diabetes in her brother; Heart attack in her father; Hypertension in her mother; Osteoporosis in her mother.    ROS:     Review of Systems  Constitutional: Negative.   HENT: Negative.    Eyes: Negative.   Respiratory: Negative.    Gastrointestinal: Negative.   Genitourinary: Negative.   Musculoskeletal: Negative.   Skin: Negative.   Neurological: Negative.   Endo/Heme/Allergies: Negative.   Psychiatric/Behavioral: Negative.    All other systems reviewed and are negative.     All other systems are reviewed and negative.    PHYSICAL EXAM: VS:  BP 122/73   Pulse 70   Ht 5' 5 (1.651 m)   Wt 155 lb 6.4 oz (70.5 kg)   SpO2 99%   BMI 25.86 kg/m  , BMI Body mass index is 25.86 kg/m. Last weight:  Wt Readings from Last 3 Encounters:  08/09/23 155 lb 6.4 oz (70.5 kg)  07/26/23 158 lb (71.7 kg)  04/24/23 158 lb (71.7 kg)     Physical Exam Constitutional:      Appearance: Normal appearance.  Cardiovascular:     Rate and Rhythm: Normal rate and regular rhythm.     Heart sounds: Normal heart sounds.  Pulmonary:     Effort: Pulmonary effort is normal.     Breath sounds: Normal breath sounds.  Musculoskeletal:     Right lower leg: No edema.     Left lower leg: No edema.  Neurological:     Mental Status: She is alert.       EKG:   Recent Labs: 01/09/2023: Hemoglobin 14.2; Platelets 237 07/20/2023: ALT  29; BUN 11; Creatinine, Ser 0.83; Potassium 4.1; Sodium 141    Lipid Panel    Component Value Date/Time   CHOL 109 07/20/2023 1028   TRIG 109 07/20/2023 1028   HDL 60 07/20/2023 1028   CHOLHDL 1.8 07/20/2023 1028   LDLCALC 29 07/20/2023 1028      Other studies Reviewed: Additional studies/ records that were reviewed today include:  Review of the above records demonstrates:       No data to display            ASSESSMENT AND PLAN:    ICD-10-CM   1. Chest pain, non-cardiac  R07.89  stresss test was normal with normal LVEF. Probably musculoskeletal VS GERD, does not want protonix.    2. Bilateral carotid artery stenosis  I65.23     3. Carotid stenosis, right  I65.21     4. Atherosclerosis of native artery of right lower extremity with intermittent claudication (HCC)  I70.211     5. Peripheral vascular disease (HCC)  I73.9     6. Mixed hyperlipidemia  E78.2     7. Tobacco use disorder, continuous  F17.209        Problem List Items Addressed This Visit       Cardiovascular and Mediastinum   Carotid stenosis   Carotid stenosis, right   Atherosclerosis of native arteries of extremity with intermittent claudication (HCC)   Peripheral vascular disease (HCC)     Other   Hyperlipidemia   Tobacco use disorder, continuous   Other Visit Diagnoses       Chest pain, non-cardiac    -  Primary   stresss test was normal with normal LVEF. Probably musculoskeletal VS GERD, does not want protonix.          Disposition:   Return in about 3 months (around 11/06/2023).    Total time spent: 30 minutes  Signed,  Denyse Bathe, MD  08/09/2023 1:26 PM    [facility]

## 2023-09-03 ENCOUNTER — Other Ambulatory Visit: Payer: Self-pay | Admitting: Internal Medicine

## 2023-09-03 DIAGNOSIS — I10 Essential (primary) hypertension: Secondary | ICD-10-CM

## 2023-10-09 ENCOUNTER — Ambulatory Visit (INDEPENDENT_AMBULATORY_CARE_PROVIDER_SITE_OTHER): Payer: Medicare HMO | Admitting: Vascular Surgery

## 2023-10-09 ENCOUNTER — Ambulatory Visit (INDEPENDENT_AMBULATORY_CARE_PROVIDER_SITE_OTHER): Payer: Medicare HMO

## 2023-10-09 DIAGNOSIS — I6523 Occlusion and stenosis of bilateral carotid arteries: Secondary | ICD-10-CM

## 2023-10-09 DIAGNOSIS — I70211 Atherosclerosis of native arteries of extremities with intermittent claudication, right leg: Secondary | ICD-10-CM | POA: Diagnosis not present

## 2023-10-11 LAB — VAS US ABI WITH/WO TBI
Left ABI: 0.97
Right ABI: 0.63

## 2023-10-12 ENCOUNTER — Ambulatory Visit (INDEPENDENT_AMBULATORY_CARE_PROVIDER_SITE_OTHER): Payer: Medicare HMO | Admitting: Vascular Surgery

## 2023-10-26 ENCOUNTER — Other Ambulatory Visit

## 2023-10-26 DIAGNOSIS — E782 Mixed hyperlipidemia: Secondary | ICD-10-CM

## 2023-10-26 DIAGNOSIS — R7303 Prediabetes: Secondary | ICD-10-CM | POA: Diagnosis not present

## 2023-10-26 DIAGNOSIS — I10 Essential (primary) hypertension: Secondary | ICD-10-CM | POA: Diagnosis not present

## 2023-10-27 LAB — CBC WITH DIFFERENTIAL/PLATELET
Basophils Absolute: 0.1 10*3/uL (ref 0.0–0.2)
Basos: 1 %
EOS (ABSOLUTE): 0.3 10*3/uL (ref 0.0–0.4)
Eos: 5 %
Hematocrit: 43.2 % (ref 34.0–46.6)
Hemoglobin: 14.8 g/dL (ref 11.1–15.9)
Immature Grans (Abs): 0 10*3/uL (ref 0.0–0.1)
Immature Granulocytes: 0 %
Lymphocytes Absolute: 1.4 10*3/uL (ref 0.7–3.1)
Lymphs: 30 %
MCH: 32.2 pg (ref 26.6–33.0)
MCHC: 34.3 g/dL (ref 31.5–35.7)
MCV: 94 fL (ref 79–97)
Monocytes Absolute: 0.5 10*3/uL (ref 0.1–0.9)
Monocytes: 11 %
Neutrophils Absolute: 2.6 10*3/uL (ref 1.4–7.0)
Neutrophils: 53 %
Platelets: 249 10*3/uL (ref 150–450)
RBC: 4.59 x10E6/uL (ref 3.77–5.28)
RDW: 12.9 % (ref 11.7–15.4)
WBC: 4.9 10*3/uL (ref 3.4–10.8)

## 2023-10-27 LAB — CMP14+EGFR
ALT: 22 IU/L (ref 0–32)
AST: 30 IU/L (ref 0–40)
Albumin: 4.3 g/dL (ref 3.8–4.8)
Alkaline Phosphatase: 95 IU/L (ref 44–121)
BUN/Creatinine Ratio: 18 (ref 12–28)
BUN: 13 mg/dL (ref 8–27)
Bilirubin Total: 0.3 mg/dL (ref 0.0–1.2)
CO2: 26 mmol/L (ref 20–29)
Calcium: 9.5 mg/dL (ref 8.7–10.3)
Chloride: 99 mmol/L (ref 96–106)
Creatinine, Ser: 0.71 mg/dL (ref 0.57–1.00)
Globulin, Total: 2.1 g/dL (ref 1.5–4.5)
Glucose: 94 mg/dL (ref 70–99)
Potassium: 4 mmol/L (ref 3.5–5.2)
Sodium: 139 mmol/L (ref 134–144)
Total Protein: 6.4 g/dL (ref 6.0–8.5)
eGFR: 91 mL/min/{1.73_m2} (ref >59)

## 2023-10-27 LAB — LIPID PANEL
Chol/HDL Ratio: 1.7 ratio (ref 0.0–4.4)
Cholesterol, Total: 88 mg/dL — ABNORMAL LOW (ref 100–199)
HDL: 52 mg/dL (ref >39)
LDL Chol Calc (NIH): 16 mg/dL (ref 0–99)
Triglycerides: 108 mg/dL (ref 0–149)
VLDL Cholesterol Cal: 20 mg/dL (ref 5–40)

## 2023-10-27 LAB — HEMOGLOBIN A1C
Est. average glucose Bld gHb Est-mCnc: 117 mg/dL
Hgb A1c MFr Bld: 5.7 % — ABNORMAL HIGH (ref 4.8–5.6)

## 2023-10-27 LAB — TSH: TSH: 0.975 u[IU]/mL (ref 0.450–4.500)

## 2023-10-31 ENCOUNTER — Encounter: Payer: Self-pay | Admitting: Internal Medicine

## 2023-10-31 ENCOUNTER — Ambulatory Visit (INDEPENDENT_AMBULATORY_CARE_PROVIDER_SITE_OTHER): Payer: Medicare HMO | Admitting: Internal Medicine

## 2023-10-31 VITALS — BP 122/70 | HR 81 | Temp 97.4°F | Ht 65.0 in | Wt 159.4 lb

## 2023-10-31 DIAGNOSIS — E782 Mixed hyperlipidemia: Secondary | ICD-10-CM

## 2023-10-31 DIAGNOSIS — R7303 Prediabetes: Secondary | ICD-10-CM

## 2023-10-31 DIAGNOSIS — I10 Essential (primary) hypertension: Secondary | ICD-10-CM

## 2023-10-31 DIAGNOSIS — F418 Other specified anxiety disorders: Secondary | ICD-10-CM | POA: Diagnosis not present

## 2023-10-31 DIAGNOSIS — T162XXA Foreign body in left ear, initial encounter: Secondary | ICD-10-CM | POA: Diagnosis not present

## 2023-10-31 MED ORDER — CLONAZEPAM 0.5 MG PO TABS: 0.5000 mg | ORAL_TABLET | Freq: Every day | ORAL | 2 refills | Status: DC

## 2023-10-31 NOTE — Progress Notes (Signed)
 Established Patient Office Visit  Subjective:  Patient ID: Bethany Lozano, female    DOB: 02-07-1951  Age: 73 y.o. MRN: [id number]  Chief Complaint  Patient presents with   Follow-up    3 month lab results    C/o possible insect in her left ear for a few weeks. Also here for lab review and medication refills. LDL and TC well controlled on lab review. Triglycerides also satisfactory with improved A1c and unremarkable cmp and cbc.     No other concerns at this time.   Past Medical History:  Diagnosis Date   Anxiety    Depression    GERD (gastroesophageal reflux disease)    Heart murmur    Hemorrhoids    Hyperlipidemia    Peripheral vascular disease (HCC)    Sinusitis    Tobacco use     Past Surgical History:  Procedure Laterality Date   ABDOMINAL HYSTERECTOMY  1991   APPENDECTOMY     BICEPT TENODESIS Left 10/08/2017   Procedure: BICEPS TENODESIS;  Surgeon: Lorri Rota, MD;  Location: [facility];  Service: Orthopedics;  Laterality: Left;   CARDIAC CATHETERIZATION N/A 11/08/2015   Procedure: Left Heart Cath and Coronary Angiography;  Surgeon: Cherrie Cornwall, MD;  Location: [facility];  Service: Cardiovascular;  Laterality: N/A;   CARDIAC CATHETERIZATION     Mynx placed in right artery after heart catherization   CAROTID STENT INSERTION Right 05/15/2016   COLONOSCOPY  2014   COLONOSCOPY N/A 12/27/2021   Procedure: COLONOSCOPY;  Surgeon: Shane Darling, MD;  Location: [facility];  Service: Endoscopy;  Laterality: N/A;   ENDARTERECTOMY Right 11/25/2015   Procedure: ENDARTERECTOMY CAROTID;  Surgeon: Celso College, MD;  Location: [facility];  Service: Vascular;  Laterality: Right;   ESOPHAGOGASTRODUODENOSCOPY N/A 12/27/2021   Procedure: ESOPHAGOGASTRODUODENOSCOPY (EGD);  Surgeon: Shane Darling, MD;  Location: [facility];  Service: Endoscopy;  Laterality: N/A;   FISSURECTOMY  10/22/14   HEMORRHOIDECTOMY WITH HEMORRHOID BANDING  1991,06/20/14, 10/22/14    PERIPHERAL VASCULAR CATHETERIZATION Right 05/15/2016   Procedure: Carotid PTA/Stent Intervention;  Surgeon: Celso College, MD;  Location: [facility];  Service: Cardiovascular;  Laterality: Right;   RESECTION DISTAL CLAVICAL  10/08/2017   Procedure: RESECTION DISTAL CLAVICAL;  Surgeon: Lorri Rota, MD;  Location: [facility];  Service: Orthopedics;;   SHOULDER ARTHROSCOPY WITH SUBACROMIAL DECOMPRESSION Left 10/08/2017   Procedure: SHOULDER ARTHROSCOPY WITH SUBACROMIAL DECOMPRESSION;  Surgeon: Lorri Rota, MD;  Location: [facility];  Service: Orthopedics;  Laterality: Left;   SHOULDER OPEN ROTATOR CUFF REPAIR Left 10/08/2017   Procedure: ROTATOR CUFF REPAIR SHOULDER OPEN Extensive Glenoid Humeral debridement;  Surgeon: Lorri Rota, MD;  Location: [facility];  Service: Orthopedics;  Laterality: Left;   TUBAL LIGATION  1977    Social History   Socioeconomic History   Marital status: Single    Spouse name: Not on file   Number of children: Not on file   Years of education: Not on file   Highest education level: Not on file  Occupational History   Occupation: retired  Tobacco Use   Smoking status: Every Day    Current packs/day: 1.00    Average packs/day: 1 pack/day for 45.0 years (45.0 ttl pk-yrs)    Types: Cigarettes   Smokeless tobacco: Never   Tobacco comments:    Pt reports not ready to quit after education provided.  Vaping Use   Vaping status: Never Used  Substance and Sexual Activity   Alcohol use:  Not Currently    Alcohol/week: 0.0 standard drinks of alcohol   Drug use: No   Sexual activity: Not Currently    Birth control/protection: Post-menopausal  Other Topics Concern   Not on file  Social History Narrative   Not on file   Social Drivers of Health   Financial Resource Strain: Not on file  Food Insecurity: Not on file  Transportation Needs: Not on file  Physical Activity: Not on file  Stress: Not on file  Social Connections: Not on file  Intimate Partner Violence:  Not on file    Family History  Problem Relation Age of Onset   Hypertension Mother    Osteoporosis Mother    Heart attack Father    Diabetes Brother    Breast cancer Neg Hx     Allergies  Allergen Reactions   Codeine Nausea Only   Latex     Other reaction(Tressa Maldonado): Other (See Comments)   Paxlovid  [Nirmatrelvir-Ritonavir]     Nausea and vomiting   Adhesive [Tape] Itching and Rash   Penicillins Rash    Tolerates ampicillin  Has patient had a PCN reaction causing immediate rash, facial/tongue/throat swelling, SOB or lightheadedness with hypotension: no Has patient had a PCN reaction causing severe rash involving mucus membranes or skin necrosis: no Has patient had a PCN reaction that required hospitalization {no Has patient had a PCN reaction occurring within the last 10 years: no If all of the above answers are "NO", then may proceed with Cephalosporin use.    Outpatient Medications Prior to Visit  Medication Sig   acetaminophen  (TYLENOL ) 500 MG tablet Take 500 mg by mouth every 6 (six) hours as needed for moderate pain.   aspirin  81 MG EC tablet Take 81 mg by mouth daily.   chlorthalidone  (HYGROTON ) 25 MG tablet TAKE 1 TABLET (25 MG TOTAL) BY MOUTH DAILY.   Cholecalciferol  (VITAMIN D3) 5000 units CAPS Take 1,000 Units by mouth daily.    clopidogrel  (PLAVIX ) 75 MG tablet TAKE 1 TABLET BY MOUTH EVERY DAY   cyanocobalamin (VITAMIN B12) 100 MCG tablet Take 100 mcg by mouth once a week.   DULoxetine  (CYMBALTA ) 60 MG capsule TAKE 1 CAPSULE BY MOUTH EVERY DAY   estradiol (ESTRACE) 0.5 MG tablet TAKE 1 TABLET BY MOUTH EVERY DAY   Evolocumab (REPATHA SURECLICK) 140 MG/ML SOAJ INJECT 1 INJECTION EVERY 2 WEEKS   fluticasone  (FLONASE ) 50 MCG/ACT nasal spray Place 1 spray into both nostrils daily as needed for allergies or rhinitis.    ondansetron  (ZOFRAN ) 4 MG tablet Take 1 tablet (4 mg total) by mouth every 6 (six) hours as needed for nausea.   rosuvastatin  (CRESTOR ) 10 MG tablet TAKE 1  TABLET BY MOUTH EVERY DAY   valsartan (DIOVAN) 80 MG tablet TAKE 1 TABLET BY MOUTH EVERY DAY IN THE MORNING   [DISCONTINUED] clonazePAM  (KLONOPIN ) 0.5 MG tablet TAKE 1 TABLET BY MOUTH EVERY DAY   No facility-administered medications prior to visit.    Review of Systems  Constitutional: Negative.   HENT:  Positive for ear pain.   Eyes: Negative.   Respiratory: Negative.    Cardiovascular: Negative.   Gastrointestinal: Negative.   Genitourinary: Negative.   Skin: Negative.   Neurological: Negative.   Endo/Heme/Allergies: Negative.        Objective:   BP 122/70   Pulse 81   Temp (!) 97.4 F (36.3 C)   Ht 5\' 5"  (1.651 m)   Wt 159 lb 6.4 oz (72.3 kg)   SpO2 98%  BMI 26.53 kg/m   Vitals:   10/31/23 1055  BP: 122/70  Pulse: 81  Temp: (!) 97.4 F (36.3 C)  Height: 5\' 5"  (1.651 m)  Weight: 159 lb 6.4 oz (72.3 kg)  SpO2: 98%  BMI (Calculated): 26.53    Physical Exam Vitals reviewed.  Constitutional:      General: She is not in acute distress. HENT:     Head: Normocephalic.     Nose: Nose normal.     Mouth/Throat:     Mouth: Mucous membranes are moist.  Eyes:     Extraocular Movements: Extraocular movements intact.     Pupils: Pupils are equal, round, and reactive to light.  Cardiovascular:     Rate and Rhythm: Normal rate and regular rhythm.     Heart sounds: Murmur heard.     Crescendo decrescendo systolic murmur is present with a grade of 3/6.  Pulmonary:     Effort: Pulmonary effort is normal.     Breath sounds: No rhonchi or rales.  Abdominal:     General: Abdomen is flat.     Palpations: There is no hepatomegaly, splenomegaly or mass.  Musculoskeletal:        General: Normal range of motion.     Cervical back: Normal range of motion. No tenderness.  Skin:    General: Skin is warm and dry.  Neurological:     General: No focal deficit present.     Mental Status: She is alert and oriented to person, place, and time.     Cranial Nerves: No cranial  nerve deficit.     Motor: No weakness.  Psychiatric:        Mood and Affect: Mood normal.        Behavior: Behavior normal.      No results found for any visits on 10/31/23.  Recent Results (from the past 2160 hours)  VAS US  ABI WITH/WO TBI     Status: None   Collection Time: 10/09/23  2:29 PM  Result Value Ref Range   Right ABI .63    Left ABI .97   Hemoglobin A1c     Status: Abnormal   Collection Time: 10/26/23 10:32 AM  Result Value Ref Range   Hgb A1c MFr Bld 5.7 (H) 4.8 - 5.6 %    Comment:          Prediabetes: 5.7 - 6.4          Diabetes: >6.4          Glycemic control for adults with diabetes: <7.0    Est. average glucose Bld gHb Est-mCnc 117 mg/dL  TSH     Status: None   Collection Time: 10/26/23 10:32 AM  Result Value Ref Range   TSH 0.975 0.450 - 4.500 uIU/mL  CMP14+EGFR     Status: None   Collection Time: 10/26/23 10:32 AM  Result Value Ref Range   Glucose 94 70 - 99 mg/dL   BUN 13 8 - 27 mg/dL   Creatinine, Ser 1.61 0.57 - 1.00 mg/dL   eGFR 91 >09 UE/AVW/0.98   BUN/Creatinine Ratio 18 12 - 28   Sodium 139 134 - 144 mmol/L   Potassium 4.0 3.5 - 5.2 mmol/L   Chloride 99 96 - 106 mmol/L   CO2 26 20 - 29 mmol/L   Calcium  9.5 8.7 - 10.3 mg/dL   Total Protein 6.4 6.0 - 8.5 g/dL   Albumin 4.3 3.8 - 4.8 g/dL   Globulin, Total 2.1 1.5 -  4.5 g/dL   Bilirubin Total 0.3 0.0 - 1.2 mg/dL   Alkaline Phosphatase 95 44 - 121 IU/L   AST 30 0 - 40 IU/L   ALT 22 0 - 32 IU/L  Lipid panel     Status: Abnormal   Collection Time: 10/26/23 10:32 AM  Result Value Ref Range   Cholesterol, Total 88 (L) 100 - 199 mg/dL   Triglycerides 696 0 - 149 mg/dL   HDL 52 >29 mg/dL   VLDL Cholesterol Cal 20 5 - 40 mg/dL   LDL Chol Calc (NIH) 16 0 - 99 mg/dL   Chol/HDL Ratio 1.7 0.0 - 4.4 ratio    Comment:                                   T. Chol/HDL Ratio                                             Men  Women                               1/2 Avg.Risk  3.4    3.3                                    Avg.Risk  5.0    4.4                                2X Avg.Risk  9.6    7.1                                3X Avg.Risk 23.4   11.0   CBC with Diff     Status: None   Collection Time: 10/26/23 10:32 AM  Result Value Ref Range   WBC 4.9 3.4 - 10.8 x10E3/uL   RBC 4.59 3.77 - 5.28 x10E6/uL   Hemoglobin 14.8 11.1 - 15.9 g/dL   Hematocrit 52.8 41.3 - 46.6 %   MCV 94 79 - 97 fL   MCH 32.2 26.6 - 33.0 pg   MCHC 34.3 31.5 - 35.7 g/dL   RDW 24.4 01.0 - 27.2 %   Platelets 249 150 - 450 x10E3/uL   Neutrophils 53 Not Estab. %   Lymphs 30 Not Estab. %   Monocytes 11 Not Estab. %   Eos 5 Not Estab. %   Basos 1 Not Estab. %   Neutrophils Absolute 2.6 1.4 - 7.0 x10E3/uL   Lymphocytes Absolute 1.4 0.7 - 3.1 x10E3/uL   Monocytes Absolute 0.5 0.1 - 0.9 x10E3/uL   EOS (ABSOLUTE) 0.3 0.0 - 0.4 x10E3/uL   Basophils Absolute 0.1 0.0 - 0.2 x10E3/uL   Immature Granulocytes 0 Not Estab. %   Immature Grans (Abs) 0.0 0.0 - 0.1 x10E3/uL      Assessment & Plan:  As per problem list and left ear wash. Problem List Items Addressed This Visit       Other   Hyperlipidemia   Relevant Orders   Lipid panel   Depression with anxiety   Relevant Medications  clonazePAM  (KLONOPIN ) 0.5 MG tablet   Prediabetes   Relevant Orders   Hemoglobin A1c   Other Visit Diagnoses       Ear foreign body, left, initial encounter    -  Primary     Essential hypertension         Hypertension, essential       Relevant Orders   CMP14+EGFR   Lipid panel   CBC with Diff   Hemoglobin A1c       Return in about 3 months (around 01/30/2024) for awv with labs prior.   Total time spent: 30 minutes  Arzella Bitters, MD  10/31/2023   This document may have been prepared by [facility] Voice Recognition software and as such may include unintentional dictation errors.

## 2023-11-02 ENCOUNTER — Ambulatory Visit (INDEPENDENT_AMBULATORY_CARE_PROVIDER_SITE_OTHER): Admitting: Vascular Surgery

## 2023-11-02 ENCOUNTER — Encounter (INDEPENDENT_AMBULATORY_CARE_PROVIDER_SITE_OTHER): Payer: Self-pay | Admitting: Vascular Surgery

## 2023-11-02 VITALS — BP 136/75 | HR 69 | Resp 18 | Ht 65.0 in | Wt 162.0 lb

## 2023-11-02 DIAGNOSIS — I70211 Atherosclerosis of native arteries of extremities with intermittent claudication, right leg: Secondary | ICD-10-CM | POA: Diagnosis not present

## 2023-11-02 DIAGNOSIS — I6523 Occlusion and stenosis of bilateral carotid arteries: Secondary | ICD-10-CM | POA: Diagnosis not present

## 2023-11-02 DIAGNOSIS — I10 Essential (primary) hypertension: Secondary | ICD-10-CM | POA: Diagnosis not present

## 2023-11-02 NOTE — Progress Notes (Signed)
 Subjective:    Patient ID: Bethany Lozano, female    DOB: January 23, 1951, 73 y.o.   MRN: [id number] Chief Complaint  Patient presents with   Follow-up    6 month carotid + AB    Patient returns today in follow up of multiple vascular issues.  She has previously undergone right carotid endarterectomy with subsequent right carotid artery stenting for recurrence.  This was most recently about 7 years ago.  She is doing well without any focal neurologic symptoms. Specifically, the patient denies amaurosis fugax, speech or swallowing difficulties, or arm or leg weakness or numbness.  Carotid duplex from 10/09/23 reveals a widely patent right carotid artery stent and stable 1 to 39% left ICA stenosis. She is also followed for peripheral arterial disease.  She has stable claudication symptoms which at this time are moderate and not essentially unchanged from her previous visits.  No limb threatening symptoms such as rest pain or ulceration. ABIs previously are stable at 0.77 on the right and 1.01 on the left.      Review of Systems  Constitutional: Negative.   HENT: Negative.    Respiratory: Negative.    Cardiovascular: Negative.   Gastrointestinal: Negative.   Genitourinary: Negative.   Neurological: Negative.   Psychiatric/Behavioral: Negative.    All other systems reviewed and are negative.      Objective:   Physical Exam Vitals reviewed.  Constitutional:      Appearance: Normal appearance. She is normal weight.  HENT:     Head: Normocephalic.  Eyes:     Pupils: Pupils are equal, round, and reactive to light.  Cardiovascular:     Rate and Rhythm: Normal rate and regular rhythm.     Pulses: Normal pulses.     Heart sounds: Normal heart sounds.  Pulmonary:     Effort: Pulmonary effort is normal.     Breath sounds: Normal breath sounds.  Abdominal:     General: Abdomen is flat. Bowel sounds are normal.     Palpations: Abdomen is soft.  Musculoskeletal:        General: Normal  range of motion.     Cervical back: Normal range of motion.  Skin:    General: Skin is warm and dry.     Capillary Refill: Capillary refill takes 2 to 3 seconds.  Neurological:     General: No focal deficit present.     Mental Status: She is alert and oriented to person, place, and time. Mental status is at baseline.  Psychiatric:        Mood and Affect: Mood normal.        Behavior: Behavior normal.        Thought Content: Thought content normal.        Judgment: Judgment normal.     BP 136/75   Pulse 69   Resp 18   Ht 5\' 5"  (1.651 m)   Wt 162 lb (73.5 kg)   BMI 26.96 kg/m   Past Medical History:  Diagnosis Date   Anxiety    Depression    GERD (gastroesophageal reflux disease)    Heart murmur    Hemorrhoids    Hyperlipidemia    Peripheral vascular disease (HCC)    Sinusitis    Tobacco use     Social History   Socioeconomic History   Marital status: Single    Spouse name: Not on file   Number of children: Not on file   Years of education: Not on file  Highest education level: Not on file  Occupational History   Occupation: retired  Tobacco Use   Smoking status: Every Day    Current packs/day: 1.00    Average packs/day: 1 pack/day for 45.0 years (45.0 ttl pk-yrs)    Types: Cigarettes   Smokeless tobacco: Never   Tobacco comments:    Pt reports not ready to quit after education provided.  Vaping Use   Vaping status: Never Used  Substance and Sexual Activity   Alcohol use: Not Currently    Alcohol/week: 0.0 standard drinks of alcohol   Drug use: No   Sexual activity: Not Currently    Birth control/protection: Post-menopausal  Other Topics Concern   Not on file  Social History Narrative   Not on file   Social Drivers of Health   Financial Resource Strain: Not on file  Food Insecurity: Not on file  Transportation Needs: Not on file  Physical Activity: Not on file  Stress: Not on file  Social Connections: Not on file  Intimate Partner Violence:  Not on file    Past Surgical History:  Procedure Laterality Date   ABDOMINAL HYSTERECTOMY  1991   APPENDECTOMY     BICEPT TENODESIS Left 10/08/2017   Procedure: BICEPS TENODESIS;  Surgeon: Lorri Rota, MD;  Location: [facility];  Service: Orthopedics;  Laterality: Left;   CARDIAC CATHETERIZATION N/A 11/08/2015   Procedure: Left Heart Cath and Coronary Angiography;  Surgeon: Cherrie Cornwall, MD;  Location: [facility];  Service: Cardiovascular;  Laterality: N/A;   CARDIAC CATHETERIZATION     Mynx placed in right artery after heart catherization   CAROTID STENT INSERTION Right 05/15/2016   COLONOSCOPY  2014   COLONOSCOPY N/A 12/27/2021   Procedure: COLONOSCOPY;  Surgeon: Shane Darling, MD;  Location: [facility];  Service: Endoscopy;  Laterality: N/A;   ENDARTERECTOMY Right 11/25/2015   Procedure: ENDARTERECTOMY CAROTID;  Surgeon: Celso College, MD;  Location: [facility];  Service: Vascular;  Laterality: Right;   ESOPHAGOGASTRODUODENOSCOPY N/A 12/27/2021   Procedure: ESOPHAGOGASTRODUODENOSCOPY (EGD);  Surgeon: Shane Darling, MD;  Location: [facility];  Service: Endoscopy;  Laterality: N/A;   FISSURECTOMY  10/22/14   HEMORRHOIDECTOMY WITH HEMORRHOID BANDING  1991,06/20/14, 10/22/14   PERIPHERAL VASCULAR CATHETERIZATION Right 05/15/2016   Procedure: Carotid PTA/Stent Intervention;  Surgeon: Celso College, MD;  Location: [facility];  Service: Cardiovascular;  Laterality: Right;   RESECTION DISTAL CLAVICAL  10/08/2017   Procedure: RESECTION DISTAL CLAVICAL;  Surgeon: Lorri Rota, MD;  Location: [facility];  Service: Orthopedics;;   SHOULDER ARTHROSCOPY WITH SUBACROMIAL DECOMPRESSION Left 10/08/2017   Procedure: SHOULDER ARTHROSCOPY WITH SUBACROMIAL DECOMPRESSION;  Surgeon: Lorri Rota, MD;  Location: [facility];  Service: Orthopedics;  Laterality: Left;   SHOULDER OPEN ROTATOR CUFF REPAIR Left 10/08/2017   Procedure: ROTATOR CUFF REPAIR SHOULDER OPEN Extensive Glenoid Humeral  debridement;  Surgeon: Lorri Rota, MD;  Location: [facility];  Service: Orthopedics;  Laterality: Left;   TUBAL LIGATION  1977    Family History  Problem Relation Age of Onset   Hypertension Mother    Osteoporosis Mother    Heart attack Father    Diabetes Brother    Breast cancer Neg Hx     Allergies  Allergen Reactions   Codeine Nausea Only   Latex     Other reaction(s): Other (See Comments)   Paxlovid  [Nirmatrelvir-Ritonavir]     Nausea and vomiting   Adhesive [Tape] Itching and Rash   Penicillins Rash  Tolerates ampicillin  Has patient had a PCN reaction causing immediate rash, facial/tongue/throat swelling, SOB or lightheadedness with hypotension: no Has patient had a PCN reaction causing severe rash involving mucus membranes or skin necrosis: no Has patient had a PCN reaction that required hospitalization {no Has patient had a PCN reaction occurring within the last 10 years: no If all of the above answers are "NO", then may proceed with Cephalosporin use.       Latest Ref Rng & Units 10/26/2023   10:32 AM 01/09/2023   12:18 PM 09/26/2021   10:06 AM  CBC  WBC 3.4 - 10.8 x10E3/uL 4.9  4.3  5.4   Hemoglobin 11.1 - 15.9 g/dL 95.2  84.1  32.4   Hematocrit 34.0 - 46.6 % 43.2  41.9  43.4   Platelets 150 - 450 x10E3/uL 249  237  232       CMP     Component Value Date/Time   NA 139 10/26/2023 1032   K 4.0 10/26/2023 1032   CL 99 10/26/2023 1032   CO2 26 10/26/2023 1032   GLUCOSE 94 10/26/2023 1032   GLUCOSE 101 (H) 09/26/2021 1006   BUN 13 10/26/2023 1032   CREATININE 0.71 10/26/2023 1032   CALCIUM  9.5 10/26/2023 1032   PROT 6.4 10/26/2023 1032   ALBUMIN 4.3 10/26/2023 1032   AST 30 10/26/2023 1032   ALT 22 10/26/2023 1032   ALKPHOS 95 10/26/2023 1032   BILITOT 0.3 10/26/2023 1032   EGFR 91 10/26/2023 1032   GFRNONAA >60 09/26/2021 1006     VAS US  ABI WITH/WO TBI Result Date: 10/11/2023  LOWER EXTREMITY DOPPLER STUDY Patient Name:  ANDREA DUCK  Date of  Exam:   10/09/2023 Medical Rec #: [id number]      Accession #:    [id number] Date of Birth: Nov 25, 1950     Patient Gender: F Patient Age:   72 years Exam Location:  [facility] Procedure:      VAS US  ABI WITH/WO TBI Referring Phys: --------------------------------------------------------------------------------  Indications: Rest pain, and peripheral artery disease. right leg new symptoms High Risk Factors: Hyperlipidemia.  Comparison Study: 04/2023 Performing Technologist: Faustine Hoof RVT  Examination Guidelines: A complete evaluation includes at minimum, Doppler waveform signals and systolic blood pressure reading at the level of bilateral brachial, anterior tibial, and posterior tibial arteries, when vessel segments are accessible. Bilateral testing is considered an integral part of a complete examination. Photoelectric Plethysmograph (PPG) waveforms and toe systolic pressure readings are included as required and additional duplex testing as needed. Limited examinations for reoccurring indications may be performed as noted.  ABI Findings: +---------+------------------+-----+----------+--------+ Right    Rt Pressure (mmHg)IndexWaveform  Comment  +---------+------------------+-----+----------+--------+ Brachial 154                                       +---------+------------------+-----+----------+--------+ PTA      85                0.55 monophasic         +---------+------------------+-----+----------+--------+ DP       98                0.63 biphasic           +---------+------------------+-----+----------+--------+ Great Toe59                0.38 Abnormal           +---------+------------------+-----+----------+--------+ +---------+------------------+-----+--------+-------+  Left     Lt Pressure (mmHg)IndexWaveformComment +---------+------------------+-----+--------+-------+ Brachial 155                                     +---------+------------------+-----+--------+-------+ PTA      150               0.97 biphasic        +---------+------------------+-----+--------+-------+ DP       146               0.94 biphasic        +---------+------------------+-----+--------+-------+ Great Toe110               0.71 Normal          +---------+------------------+-----+--------+-------+ +-------+-----------+-----------+------------+------------+ ABI/TBIToday's ABIToday's TBIPrevious ABIPrevious TBI +-------+-----------+-----------+------------+------------+ Right  .63        .38        .77         .40          +-------+-----------+-----------+------------+------------+ Left   .97        .71        1.01        .41          +-------+-----------+-----------+------------+------------+  Right ABIs appear decreased compared to prior study on 04/2023. Left TBIs appear increased.  Summary: Right: Resting right ankle-brachial index indicates moderate right lower extremity arterial disease. The right toe-brachial index is abnormal. Left: Resting left ankle-brachial index is within normal range. The left toe-brachial index is normal. *See table(s) above for measurements and observations.  Electronically signed by Mikki Alexander MD on 10/11/2023 at 7:26:53 AM.    Final        Assessment & Plan:   1. Atherosclerosis of native artery of right lower extremity with intermittent claudication (HCC) (Primary)  ABIs from prior visit are stable at 0.77 on the right and 1.01 on the left. Claudication symptoms are stable and moderate.  She does not desire intervention and that is perfectly reasonable as she does not have limb threatening symptoms.  Continue dual antiplatelet therapy and statin agent.   2. Bilateral carotid artery stenosis The patient is seen for follow up evaluation of carotid stenosis. The carotid stenosis followed by ultrasound.   The patient denies amaurosis fugax. There is no recent history of TIA  symptoms or focal motor deficits. There is no prior documented CVA.  The patient is taking enteric-coated aspirin  81 mg daily as well as Plavix  75 mg daily.  There is no history of migraine headaches. There is no history of seizures.  No recent shortening of the patient's walking distance or new symptoms consistent with claudication.  No history of rest pain symptoms. No new ulcers or wounds of the lower extremities have occurred.  There is no history of DVT, PE or superficial thrombophlebitis. No documented recent episodes of angina or shortness of breath documented.   Carotid Duplex done on 10/09/23 shows right carotid with less than 50% stenosis.  Widely patent ICA stent with no evidence of significant stenosis and left carotid with a 1 to 39% stenosis in the ECA.  Less than 50% stenosis.  No change compared previous study.  Patient will follow-up in 6 months with carotid ultrasound studies and bilateral lower extremity ultrasounds with ABIs.  3. Essential hypertension Continue antihypertensive medications as already ordered, these medications have been reviewed and there are no changes at this time.*   Current  Outpatient Medications on File Prior to Visit  Medication Sig Dispense Refill   acetaminophen  (TYLENOL ) 500 MG tablet Take 500 mg by mouth every 6 (six) hours as needed for moderate pain.     aspirin  81 MG EC tablet Take 81 mg by mouth daily.     chlorthalidone  (HYGROTON ) 25 MG tablet TAKE 1 TABLET (25 MG TOTAL) BY MOUTH DAILY. 90 tablet 1   Cholecalciferol  (VITAMIN D3) 5000 units CAPS Take 1,000 Units by mouth daily.      clonazePAM  (KLONOPIN ) 0.5 MG tablet Take 1 tablet (0.5 mg total) by mouth daily. 30 tablet 2   clopidogrel  (PLAVIX ) 75 MG tablet TAKE 1 TABLET BY MOUTH EVERY DAY 90 tablet 3   cyanocobalamin (VITAMIN B12) 100 MCG tablet Take 100 mcg by mouth once a week.     DULoxetine  (CYMBALTA ) 60 MG capsule TAKE 1 CAPSULE BY MOUTH EVERY DAY 90 capsule 1   estradiol (ESTRACE)  0.5 MG tablet TAKE 1 TABLET BY MOUTH EVERY DAY 90 tablet 1   Evolocumab (REPATHA SURECLICK) 140 MG/ML SOAJ INJECT 1 INJECTION EVERY 2 WEEKS 90 mL 1   fluticasone  (FLONASE ) 50 MCG/ACT nasal spray Place 1 spray into both nostrils daily as needed for allergies or rhinitis.   0   ondansetron  (ZOFRAN ) 4 MG tablet Take 1 tablet (4 mg total) by mouth every 6 (six) hours as needed for nausea. 20 tablet 0   rosuvastatin  (CRESTOR ) 10 MG tablet TAKE 1 TABLET BY MOUTH EVERY DAY 90 tablet 1   valsartan (DIOVAN) 80 MG tablet TAKE 1 TABLET BY MOUTH EVERY DAY IN THE MORNING 90 tablet 1   No current facility-administered medications on file prior to visit.    There are no Patient Instructions on file for this visit. No follow-ups on file.   Annamaria Barrette, NP

## 2023-11-06 ENCOUNTER — Ambulatory Visit: Payer: Medicare HMO | Admitting: Cardiovascular Disease

## 2023-11-20 ENCOUNTER — Encounter (INDEPENDENT_AMBULATORY_CARE_PROVIDER_SITE_OTHER): Payer: Self-pay

## 2023-12-01 ENCOUNTER — Other Ambulatory Visit: Payer: Self-pay | Admitting: Internal Medicine

## 2023-12-01 ENCOUNTER — Other Ambulatory Visit (INDEPENDENT_AMBULATORY_CARE_PROVIDER_SITE_OTHER): Payer: Self-pay | Admitting: Nurse Practitioner

## 2023-12-01 DIAGNOSIS — I10 Essential (primary) hypertension: Secondary | ICD-10-CM

## 2023-12-01 DIAGNOSIS — F418 Other specified anxiety disorders: Secondary | ICD-10-CM

## 2023-12-01 DIAGNOSIS — N951 Menopausal and female climacteric states: Secondary | ICD-10-CM

## 2023-12-01 DIAGNOSIS — E785 Hyperlipidemia, unspecified: Secondary | ICD-10-CM

## 2023-12-31 ENCOUNTER — Other Ambulatory Visit: Payer: Self-pay | Admitting: Internal Medicine

## 2023-12-31 DIAGNOSIS — Z1231 Encounter for screening mammogram for malignant neoplasm of breast: Secondary | ICD-10-CM

## 2024-01-15 ENCOUNTER — Ambulatory Visit
Admission: RE | Admit: 2024-01-15 | Discharge: 2024-01-15 | Disposition: A | Discharge status: Home / Self Care | Source: Ambulatory Visit | Attending: Internal Medicine | Admitting: Internal Medicine

## 2024-01-15 DIAGNOSIS — Z1231 Encounter for screening mammogram for malignant neoplasm of breast: Secondary | ICD-10-CM | POA: Diagnosis not present

## 2024-01-18 ENCOUNTER — Other Ambulatory Visit: Payer: Self-pay | Admitting: Internal Medicine

## 2024-01-18 DIAGNOSIS — R928 Other abnormal and inconclusive findings on diagnostic imaging of breast: Secondary | ICD-10-CM

## 2024-01-25 ENCOUNTER — Other Ambulatory Visit

## 2024-01-25 DIAGNOSIS — I10 Essential (primary) hypertension: Secondary | ICD-10-CM | POA: Diagnosis not present

## 2024-01-25 DIAGNOSIS — E782 Mixed hyperlipidemia: Secondary | ICD-10-CM | POA: Diagnosis not present

## 2024-01-25 DIAGNOSIS — R7303 Prediabetes: Secondary | ICD-10-CM | POA: Diagnosis not present

## 2024-01-26 LAB — CBC WITH DIFFERENTIAL/PLATELET
Basophils Absolute: 0.1 x10E3/uL (ref 0.0–0.2)
Basos: 1 %
EOS (ABSOLUTE): 0.3 x10E3/uL (ref 0.0–0.4)
Eos: 5 %
Hematocrit: 43.8 % (ref 34.0–46.6)
Hemoglobin: 14.2 g/dL (ref 11.1–15.9)
Immature Grans (Abs): 0 x10E3/uL (ref 0.0–0.1)
Immature Granulocytes: 0 %
Lymphocytes Absolute: 1.5 x10E3/uL (ref 0.7–3.1)
Lymphs: 27 %
MCH: 30.9 pg (ref 26.6–33.0)
MCHC: 32.4 g/dL (ref 31.5–35.7)
MCV: 95 fL (ref 79–97)
Monocytes Absolute: 0.6 x10E3/uL (ref 0.1–0.9)
Monocytes: 11 %
Neutrophils Absolute: 3 x10E3/uL (ref 1.4–7.0)
Neutrophils: 56 %
Platelets: 226 x10E3/uL (ref 150–450)
RBC: 4.59 x10E6/uL (ref 3.77–5.28)
RDW: 13.4 % (ref 11.7–15.4)
WBC: 5.4 x10E3/uL (ref 3.4–10.8)

## 2024-01-26 LAB — LIPID PANEL
Chol/HDL Ratio: 1.6 ratio (ref 0.0–4.4)
Cholesterol, Total: 89 mg/dL — ABNORMAL LOW (ref 100–199)
HDL: 54 mg/dL (ref >39)
LDL Chol Calc (NIH): 15 mg/dL (ref 0–99)
Triglycerides: 106 mg/dL (ref 0–149)
VLDL Cholesterol Cal: 20 mg/dL (ref 5–40)

## 2024-01-26 LAB — CMP14+EGFR
ALT: 25 IU/L (ref 0–32)
AST: 34 IU/L (ref 0–40)
Albumin: 4.4 g/dL (ref 3.8–4.8)
Alkaline Phosphatase: 107 IU/L (ref 44–121)
BUN/Creatinine Ratio: 13 (ref 12–28)
BUN: 8 mg/dL (ref 8–27)
Bilirubin Total: 0.3 mg/dL (ref 0.0–1.2)
CO2: 26 mmol/L (ref 20–29)
Calcium: 9.5 mg/dL (ref 8.7–10.3)
Chloride: 98 mmol/L (ref 96–106)
Creatinine, Ser: 0.63 mg/dL (ref 0.57–1.00)
Globulin, Total: 2.3 g/dL (ref 1.5–4.5)
Glucose: 102 mg/dL — ABNORMAL HIGH (ref 70–99)
Potassium: 4.1 mmol/L (ref 3.5–5.2)
Sodium: 140 mmol/L (ref 134–144)
Total Protein: 6.7 g/dL (ref 6.0–8.5)
eGFR: 95 mL/min/1.73 (ref >59)

## 2024-01-26 LAB — HEMOGLOBIN A1C
Est. average glucose Bld gHb Est-mCnc: 117 mg/dL
Hgb A1c MFr Bld: 5.7 % — ABNORMAL HIGH (ref 4.8–5.6)

## 2024-01-29 ENCOUNTER — Ambulatory Visit
Admission: RE | Admit: 2024-01-29 | Discharge: 2024-01-29 | Disposition: A | Discharge status: Home / Self Care | Source: Ambulatory Visit | Attending: Internal Medicine | Admitting: Internal Medicine

## 2024-01-29 DIAGNOSIS — N6311 Unspecified lump in the right breast, upper outer quadrant: Secondary | ICD-10-CM | POA: Diagnosis not present

## 2024-01-29 DIAGNOSIS — R92333 Mammographic heterogeneous density, bilateral breasts: Secondary | ICD-10-CM | POA: Diagnosis not present

## 2024-01-29 DIAGNOSIS — R928 Other abnormal and inconclusive findings on diagnostic imaging of breast: Secondary | ICD-10-CM | POA: Insufficient documentation

## 2024-01-29 DIAGNOSIS — N6001 Solitary cyst of right breast: Secondary | ICD-10-CM | POA: Diagnosis not present

## 2024-01-30 ENCOUNTER — Encounter: Payer: Self-pay | Admitting: Internal Medicine

## 2024-01-30 ENCOUNTER — Ambulatory Visit: Payer: Self-pay | Admitting: Internal Medicine

## 2024-01-30 ENCOUNTER — Ambulatory Visit (INDEPENDENT_AMBULATORY_CARE_PROVIDER_SITE_OTHER): Admitting: Internal Medicine

## 2024-01-30 ENCOUNTER — Other Ambulatory Visit: Payer: Self-pay | Admitting: Internal Medicine

## 2024-01-30 VITALS — BP 142/68 | HR 77 | Temp 97.7°F | Ht 65.0 in | Wt 164.4 lb

## 2024-01-30 DIAGNOSIS — E782 Mixed hyperlipidemia: Secondary | ICD-10-CM | POA: Diagnosis not present

## 2024-01-30 DIAGNOSIS — J301 Allergic rhinitis due to pollen: Secondary | ICD-10-CM

## 2024-01-30 DIAGNOSIS — I10 Essential (primary) hypertension: Secondary | ICD-10-CM | POA: Diagnosis not present

## 2024-01-30 DIAGNOSIS — Z0001 Encounter for general adult medical examination with abnormal findings: Secondary | ICD-10-CM

## 2024-01-30 DIAGNOSIS — F418 Other specified anxiety disorders: Secondary | ICD-10-CM

## 2024-01-30 DIAGNOSIS — R7303 Prediabetes: Secondary | ICD-10-CM | POA: Diagnosis not present

## 2024-01-30 DIAGNOSIS — Z1331 Encounter for screening for depression: Secondary | ICD-10-CM

## 2024-01-30 MED ORDER — CETIRIZINE HCL 10 MG PO TABS: 10.0000 mg | ORAL_TABLET | Freq: Every day | ORAL | 2 refills | Status: DC

## 2024-01-30 MED ORDER — CLONAZEPAM 0.5 MG PO TABS: 0.5000 mg | ORAL_TABLET | Freq: Every day | ORAL | 2 refills | Status: DC

## 2024-01-30 MED ORDER — FLUTICASONE PROPIONATE 50 MCG/ACT NA SUSP: 1.0000 | Freq: Every day | NASAL | 0 refills | Status: DC | PRN

## 2024-01-30 NOTE — Progress Notes (Signed)
 Established Patient Office Visit  Subjective:  Patient ID: Bethany Lozano, female    DOB: 07/28/1950  Age: 73 y.o. MRN: [id number]  Chief Complaint  Patient presents with   Annual Exam    AWV    No new complaints, here for AWV refer to quality metrics and scanned documents.  Reports flare of seasonal allergies. Labs reviewed and notable for well controlled lipids, elevated fasting glucose but otherwise normal cmp and cbc.     No other concerns at this time.   Past Medical History:  Diagnosis Date   Anxiety    Depression    GERD (gastroesophageal reflux disease)    Heart murmur    Hemorrhoids    Hyperlipidemia    Peripheral vascular disease (HCC)    Sinusitis    Tobacco use     Past Surgical History:  Procedure Laterality Date   ABDOMINAL HYSTERECTOMY  1991   APPENDECTOMY     BICEPT TENODESIS Left 10/08/2017   Procedure: BICEPS TENODESIS;  Surgeon: Tobie Priest, MD;  Location: [facility];  Service: Orthopedics;  Laterality: Left;   CARDIAC CATHETERIZATION N/A 11/08/2015   Procedure: Left Heart Cath and Coronary Angiography;  Surgeon: Denyse DELENA Bathe, MD;  Location: [facility];  Service: Cardiovascular;  Laterality: N/A;   CARDIAC CATHETERIZATION     Mynx placed in right artery after heart catherization   CAROTID STENT INSERTION Right 05/15/2016   COLONOSCOPY  2014   COLONOSCOPY N/A 12/27/2021   Procedure: COLONOSCOPY;  Surgeon: Maryruth Ole DASEN, MD;  Location: [facility];  Service: Endoscopy;  Laterality: N/A;   ENDARTERECTOMY Right 11/25/2015   Procedure: ENDARTERECTOMY CAROTID;  Surgeon: Selinda GORMAN Gu, MD;  Location: [facility];  Service: Vascular;  Laterality: Right;   ESOPHAGOGASTRODUODENOSCOPY N/A 12/27/2021   Procedure: ESOPHAGOGASTRODUODENOSCOPY (EGD);  Surgeon: Maryruth Ole DASEN, MD;  Location: [facility];  Service: Endoscopy;  Laterality: N/A;   FISSURECTOMY  10/22/14   HEMORRHOIDECTOMY WITH HEMORRHOID BANDING  1991,06/20/14, 10/22/14   PERIPHERAL  VASCULAR CATHETERIZATION Right 05/15/2016   Procedure: Carotid PTA/Stent Intervention;  Surgeon: Selinda GORMAN Gu, MD;  Location: [facility];  Service: Cardiovascular;  Laterality: Right;   RESECTION DISTAL CLAVICAL  10/08/2017   Procedure: RESECTION DISTAL CLAVICAL;  Surgeon: Tobie Priest, MD;  Location: [facility];  Service: Orthopedics;;   SHOULDER ARTHROSCOPY WITH SUBACROMIAL DECOMPRESSION Left 10/08/2017   Procedure: SHOULDER ARTHROSCOPY WITH SUBACROMIAL DECOMPRESSION;  Surgeon: Tobie Priest, MD;  Location: [facility];  Service: Orthopedics;  Laterality: Left;   SHOULDER OPEN ROTATOR CUFF REPAIR Left 10/08/2017   Procedure: ROTATOR CUFF REPAIR SHOULDER OPEN Extensive Glenoid Humeral debridement;  Surgeon: Tobie Priest, MD;  Location: [facility];  Service: Orthopedics;  Laterality: Left;   TUBAL LIGATION  1977    Social History   Socioeconomic History   Marital status: Single    Spouse name: Not on file   Number of children: Not on file   Years of education: Not on file   Highest education level: Not on file  Occupational History   Occupation: retired  Tobacco Use   Smoking status: Every Day    Current packs/day: 1.00    Average packs/day: 1 pack/day for 45.0 years (45.0 ttl pk-yrs)    Types: Cigarettes   Smokeless tobacco: Never   Tobacco comments:    Pt reports not ready to quit after education provided.  Vaping Use   Vaping status: Never Used  Substance and Sexual Activity   Alcohol use: Not Currently  Alcohol/week: 0.0 standard drinks of alcohol   Drug use: No   Sexual activity: Not Currently    Birth control/protection: Post-menopausal  Other Topics Concern   Not on file  Social History Narrative   Not on file   Social Drivers of Health   Financial Resource Strain: Not on file  Food Insecurity: Not on file  Transportation Needs: Not on file  Physical Activity: Not on file  Stress: Not on file  Social Connections: Not on file  Intimate Partner Violence: Not on  file    Family History  Problem Relation Age of Onset   Hypertension Mother    Osteoporosis Mother    Heart attack Father    Diabetes Brother    Breast cancer Neg Hx     Allergies  Allergen Reactions   Codeine Nausea Only   Latex     Other reaction(Tiawanna Luchsinger): Other (See Comments)   Paxlovid  [Nirmatrelvir-Ritonavir]     Nausea and vomiting   Adhesive [Tape] Itching and Rash   Penicillins Rash    Tolerates ampicillin  Has patient had a PCN reaction causing immediate rash, facial/tongue/throat swelling, SOB or lightheadedness with hypotension: no Has patient had a PCN reaction causing severe rash involving mucus membranes or skin necrosis: no Has patient had a PCN reaction that required hospitalization {no Has patient had a PCN reaction occurring within the last 10 years: no If all of the above answers are NO, then may proceed with Cephalosporin use.    Outpatient Medications Prior to Visit  Medication Sig   acetaminophen  (TYLENOL ) 500 MG tablet Take 500 mg by mouth every 6 (six) hours as needed for moderate pain.   aspirin  81 MG EC tablet Take 81 mg by mouth daily.   chlorthalidone  (HYGROTON ) 25 MG tablet TAKE 1 TABLET (25 MG TOTAL) BY MOUTH DAILY.   Cholecalciferol  (VITAMIN D3) 5000 units CAPS Take 1,000 Units by mouth daily.    clopidogrel  (PLAVIX ) 75 MG tablet TAKE 1 TABLET BY MOUTH EVERY DAY   cyanocobalamin (VITAMIN B12) 100 MCG tablet Take 100 mcg by mouth once a week. (Patient taking differently: Take 100 mcg by mouth daily.)   DULoxetine  (CYMBALTA ) 60 MG capsule TAKE 1 CAPSULE BY MOUTH EVERY DAY   estradiol (ESTRACE) 0.5 MG tablet TAKE 1 TABLET BY MOUTH EVERY DAY   Evolocumab (REPATHA SURECLICK) 140 MG/ML SOAJ INJECT 1 INJECTION EVERY 2 WEEKS   Magnesium  100 MG TABS Take by mouth.   rosuvastatin  (CRESTOR ) 10 MG tablet TAKE 1 TABLET BY MOUTH EVERY DAY   valsartan (DIOVAN) 80 MG tablet TAKE 1 TABLET BY MOUTH EVERY DAY IN THE MORNING   [DISCONTINUED] clonazePAM  (KLONOPIN ) 0.5  MG tablet Take 1 tablet (0.5 mg total) by mouth daily.   ondansetron  (ZOFRAN ) 4 MG tablet Take 1 tablet (4 mg total) by mouth every 6 (six) hours as needed for nausea. (Patient not taking: Reported on 01/30/2024)   [DISCONTINUED] fluticasone  (FLONASE ) 50 MCG/ACT nasal spray Place 1 spray into both nostrils daily as needed for allergies or rhinitis.  (Patient not taking: Reported on 01/30/2024)   No facility-administered medications prior to visit.    Review of Systems  Constitutional: Negative.   Eyes: Negative.   Respiratory: Negative.    Cardiovascular: Negative.   Gastrointestinal: Negative.   Genitourinary: Negative.   Musculoskeletal:        Leg cramps  Skin: Negative.   Neurological: Negative.   Endo/Heme/Allergies: Negative.        Objective:   BP (!) 142/68  Pulse 77   Temp 97.7 F (36.5 C) (Tympanic)   Ht 5' 5 (1.651 m)   Wt 164 lb 6.4 oz (74.6 kg)   SpO2 98%   BMI 27.36 kg/m   Vitals:   01/30/24 1022  BP: (!) 142/68  Pulse: 77  Temp: 97.7 F (36.5 C)  Height: 5' 5 (1.651 m)  Weight: 164 lb 6.4 oz (74.6 kg)  SpO2: 98%  TempSrc: Tympanic  BMI (Calculated): 27.36    Physical Exam Vitals reviewed.  Constitutional:      General: She is not in acute distress. HENT:     Head: Normocephalic.     Nose: Nose normal.     Mouth/Throat:     Mouth: Mucous membranes are moist.  Eyes:     Extraocular Movements: Extraocular movements intact.     Pupils: Pupils are equal, round, and reactive to light.  Cardiovascular:     Rate and Rhythm: Normal rate and regular rhythm.     Heart sounds: Murmur heard.     Crescendo decrescendo systolic murmur is present with a grade of 3/6.  Pulmonary:     Effort: Pulmonary effort is normal.     Breath sounds: No rhonchi or rales.  Abdominal:     General: Abdomen is flat.     Palpations: There is no hepatomegaly, splenomegaly or mass.  Musculoskeletal:        General: Normal range of motion.     Cervical back: Normal  range of motion. No tenderness.  Skin:    General: Skin is warm and dry.  Neurological:     General: No focal deficit present.     Mental Status: She is alert and oriented to person, place, and time.     Cranial Nerves: No cranial nerve deficit.     Motor: No weakness.  Psychiatric:        Mood and Affect: Mood normal.        Behavior: Behavior normal.      No results found for any visits on 01/30/24.  Recent Results (from the past 2160 hours)  CMP14+EGFR     Status: Abnormal   Collection Time: 01/25/24 10:11 AM  Result Value Ref Range   Glucose 102 (H) 70 - 99 mg/dL   BUN 8 8 - 27 mg/dL   Creatinine, Ser 9.36 0.57 - 1.00 mg/dL   eGFR 95 >40 fO/fpw/8.26   BUN/Creatinine Ratio 13 12 - 28   Sodium 140 134 - 144 mmol/L   Potassium 4.1 3.5 - 5.2 mmol/L   Chloride 98 96 - 106 mmol/L   CO2 26 20 - 29 mmol/L   Calcium  9.5 8.7 - 10.3 mg/dL   Total Protein 6.7 6.0 - 8.5 g/dL   Albumin 4.4 3.8 - 4.8 g/dL   Globulin, Total 2.3 1.5 - 4.5 g/dL   Bilirubin Total 0.3 0.0 - 1.2 mg/dL   Alkaline Phosphatase 107 44 - 121 IU/L   AST 34 0 - 40 IU/L   ALT 25 0 - 32 IU/L  Lipid panel     Status: Abnormal   Collection Time: 01/25/24 10:11 AM  Result Value Ref Range   Cholesterol, Total 89 (L) 100 - 199 mg/dL   Triglycerides 893 0 - 149 mg/dL   HDL 54 >60 mg/dL   VLDL Cholesterol Cal 20 5 - 40 mg/dL   LDL Chol Calc (NIH) 15 0 - 99 mg/dL   Chol/HDL Ratio 1.6 0.0 - 4.4 ratio    Comment:  T. Chol/HDL Ratio                                             Men  Women                               1/2 Avg.Risk  3.4    3.3                                   Avg.Risk  5.0    4.4                                2X Avg.Risk  9.6    7.1                                3X Avg.Risk 23.4   11.0   CBC with Diff     Status: None   Collection Time: 01/25/24 10:11 AM  Result Value Ref Range   WBC 5.4 3.4 - 10.8 x10E3/uL   RBC 4.59 3.77 - 5.28 x10E6/uL   Hemoglobin 14.2 11.1  - 15.9 g/dL   Hematocrit 56.1 65.9 - 46.6 %   MCV 95 79 - 97 fL   MCH 30.9 26.6 - 33.0 pg   MCHC 32.4 31.5 - 35.7 g/dL   RDW 86.5 88.2 - 84.5 %   Platelets 226 150 - 450 x10E3/uL   Neutrophils 56 Not Estab. %   Lymphs 27 Not Estab. %   Monocytes 11 Not Estab. %   Eos 5 Not Estab. %   Basos 1 Not Estab. %   Neutrophils Absolute 3.0 1.4 - 7.0 x10E3/uL   Lymphocytes Absolute 1.5 0.7 - 3.1 x10E3/uL   Monocytes Absolute 0.6 0.1 - 0.9 x10E3/uL   EOS (ABSOLUTE) 0.3 0.0 - 0.4 x10E3/uL   Basophils Absolute 0.1 0.0 - 0.2 x10E3/uL   Immature Granulocytes 0 Not Estab. %   Immature Grans (Abs) 0.0 0.0 - 0.1 x10E3/uL  Hemoglobin A1c     Status: Abnormal   Collection Time: 01/25/24 10:11 AM  Result Value Ref Range   Hgb A1c MFr Bld 5.7 (H) 4.8 - 5.6 %    Comment:          Prediabetes: 5.7 - 6.4          Diabetes: >6.4          Glycemic control for adults with diabetes: <7.0    Est. average glucose Bld gHb Est-mCnc 117 mg/dL      Assessment & Plan:  As per problem list. The current medical regimen is effective;  continue present plan and medications.   Problem List Items Addressed This Visit       Other   Hyperlipidemia - Primary   Relevant Orders   Lipid panel   Depression with anxiety   Relevant Medications   clonazePAM  (KLONOPIN ) 0.5 MG tablet   Prediabetes   Relevant Orders   Hemoglobin A1c   Other Visit Diagnoses       Essential hypertension         Seasonal allergic rhinitis due to pollen  Relevant Medications   fluticasone  (FLONASE ) 50 MCG/ACT nasal spray   cetirizine  (ZYRTEC  ALLERGY) 10 MG tablet       Return in 3 months (on 05/01/2024) for fu with labs prior.   Total time spent: 20 minutes  Sherrill Cinderella Perry, MD  01/30/2024   This document may have been prepared by [facility] Voice Recognition software and as such may include unintentional dictation errors.

## 2024-01-31 ENCOUNTER — Ambulatory Visit: Admitting: Cardiovascular Disease

## 2024-02-15 ENCOUNTER — Encounter: Payer: Self-pay | Admitting: Cardiovascular Disease

## 2024-02-15 ENCOUNTER — Ambulatory Visit: Admitting: Cardiovascular Disease

## 2024-02-15 VITALS — BP 130/72 | HR 81 | Ht 65.0 in | Wt 165.8 lb

## 2024-02-15 DIAGNOSIS — F17209 Nicotine dependence, unspecified, with unspecified nicotine-induced disorders: Secondary | ICD-10-CM

## 2024-02-15 DIAGNOSIS — E782 Mixed hyperlipidemia: Secondary | ICD-10-CM | POA: Diagnosis not present

## 2024-02-15 DIAGNOSIS — Z720 Tobacco use: Secondary | ICD-10-CM | POA: Diagnosis not present

## 2024-02-15 DIAGNOSIS — Z0130 Encounter for examination of blood pressure without abnormal findings: Secondary | ICD-10-CM

## 2024-02-15 DIAGNOSIS — I739 Peripheral vascular disease, unspecified: Secondary | ICD-10-CM | POA: Diagnosis not present

## 2024-02-15 DIAGNOSIS — I70211 Atherosclerosis of native arteries of extremities with intermittent claudication, right leg: Secondary | ICD-10-CM

## 2024-02-15 DIAGNOSIS — I6523 Occlusion and stenosis of bilateral carotid arteries: Secondary | ICD-10-CM | POA: Diagnosis not present

## 2024-02-15 NOTE — Progress Notes (Signed)
 Cardiology Office Note   Date:  02/15/2024   ID:  Bethany Lozano, DOB 1951/01/15, MRN [id number]  PCP:  Albina GORMAN Dine, MD  Cardiologist:  Denyse Bathe, MD      History of Present Illness: Bethany Lozano is a 73 y.o. female who presents for  Chief Complaint  Patient presents with   Follow-up    3 month    Doing good.      Past Medical History:  Diagnosis Date   Anxiety    Depression    GERD (gastroesophageal reflux disease)    Heart murmur    Hemorrhoids    Hyperlipidemia    Peripheral vascular disease (HCC)    Sinusitis    Tobacco use      Past Surgical History:  Procedure Laterality Date   ABDOMINAL HYSTERECTOMY  1991   APPENDECTOMY     BICEPT TENODESIS Left 10/08/2017   Procedure: BICEPS TENODESIS;  Surgeon: Tobie Priest, MD;  Location: [facility];  Service: Orthopedics;  Laterality: Left;   CARDIAC CATHETERIZATION N/A 11/08/2015   Procedure: Left Heart Cath and Coronary Angiography;  Surgeon: Denyse DELENA Bathe, MD;  Location: [facility];  Service: Cardiovascular;  Laterality: N/A;   CARDIAC CATHETERIZATION     Mynx placed in right artery after heart catherization   CAROTID STENT INSERTION Right 05/15/2016   COLONOSCOPY  2014   COLONOSCOPY N/A 12/27/2021   Procedure: COLONOSCOPY;  Surgeon: Maryruth Ole DASEN, MD;  Location: [facility];  Service: Endoscopy;  Laterality: N/A;   ENDARTERECTOMY Right 11/25/2015   Procedure: ENDARTERECTOMY CAROTID;  Surgeon: Selinda GORMAN Gu, MD;  Location: [facility];  Service: Vascular;  Laterality: Right;   ESOPHAGOGASTRODUODENOSCOPY N/A 12/27/2021   Procedure: ESOPHAGOGASTRODUODENOSCOPY (EGD);  Surgeon: Maryruth Ole DASEN, MD;  Location: [facility];  Service: Endoscopy;  Laterality: N/A;   FISSURECTOMY  10/22/14   HEMORRHOIDECTOMY WITH HEMORRHOID BANDING  1991,06/20/14, 10/22/14   PERIPHERAL VASCULAR CATHETERIZATION Right 05/15/2016   Procedure: Carotid PTA/Stent Intervention;  Surgeon: Selinda GORMAN Gu, MD;  Location:  [facility];  Service: Cardiovascular;  Laterality: Right;   RESECTION DISTAL CLAVICAL  10/08/2017   Procedure: RESECTION DISTAL CLAVICAL;  Surgeon: Tobie Priest, MD;  Location: [facility];  Service: Orthopedics;;   SHOULDER ARTHROSCOPY WITH SUBACROMIAL DECOMPRESSION Left 10/08/2017   Procedure: SHOULDER ARTHROSCOPY WITH SUBACROMIAL DECOMPRESSION;  Surgeon: Tobie Priest, MD;  Location: [facility];  Service: Orthopedics;  Laterality: Left;   SHOULDER OPEN ROTATOR CUFF REPAIR Left 10/08/2017   Procedure: ROTATOR CUFF REPAIR SHOULDER OPEN Extensive Glenoid Humeral debridement;  Surgeon: Tobie Priest, MD;  Location: [facility];  Service: Orthopedics;  Laterality: Left;   TUBAL LIGATION  1977     Current Outpatient Medications  Medication Sig Dispense Refill   acetaminophen  (TYLENOL ) 500 MG tablet Take 500 mg by mouth every 6 (six) hours as needed for moderate pain.     aspirin  81 MG EC tablet Take 81 mg by mouth daily.     cetirizine  (ZYRTEC  ALLERGY) 10 MG tablet Take 1 tablet (10 mg total) by mouth daily. 30 tablet 2   chlorthalidone  (HYGROTON ) 25 MG tablet TAKE 1 TABLET (25 MG TOTAL) BY MOUTH DAILY. 90 tablet 1   Cholecalciferol  (VITAMIN D3) 5000 units CAPS Take 1,000 Units by mouth daily.      clonazePAM  (KLONOPIN ) 0.5 MG tablet Take 1 tablet (0.5 mg total) by mouth daily. 30 tablet 2   clopidogrel  (PLAVIX ) 75 MG tablet TAKE 1 TABLET BY MOUTH EVERY DAY 90  tablet 3   DULoxetine  (CYMBALTA ) 60 MG capsule TAKE 1 CAPSULE BY MOUTH EVERY DAY 90 capsule 1   estradiol (ESTRACE) 0.5 MG tablet TAKE 1 TABLET BY MOUTH EVERY DAY 90 tablet 1   Evolocumab (REPATHA SURECLICK) 140 MG/ML SOAJ INJECT 1 INJECTION EVERY 2 WEEKS 90 mL 1   fluticasone  (FLONASE ) 50 MCG/ACT nasal spray Place 1 spray into both nostrils daily as needed for allergies or rhinitis. 16 mL 0   Magnesium  100 MG TABS Take by mouth.     rosuvastatin  (CRESTOR ) 10 MG tablet TAKE 1 TABLET BY MOUTH EVERY DAY 90 tablet 1   valsartan (DIOVAN) 80 MG  tablet TAKE 1 TABLET BY MOUTH EVERY DAY IN THE MORNING 90 tablet 1   No current facility-administered medications for this visit.    Allergies:   Codeine, Latex, Paxlovid  [nirmatrelvir-ritonavir], Adhesive [tape], and Penicillins    Social History:   reports that she has been smoking cigarettes. She has a 45 pack-year smoking history. She has never used smokeless tobacco. She reports that she does not currently use alcohol. She reports that she does not use drugs.   Family History:  family history includes Diabetes in her brother; Heart attack in her father; Hypertension in her mother; Osteoporosis in her mother.    ROS:     Review of Systems  Constitutional: Negative.   HENT: Negative.    Eyes: Negative.   Respiratory: Negative.    Gastrointestinal: Negative.   Genitourinary: Negative.   Musculoskeletal: Negative.   Skin: Negative.   Neurological: Negative.   Endo/Heme/Allergies: Negative.   Psychiatric/Behavioral: Negative.    All other systems reviewed and are negative.     All other systems are reviewed and negative.    PHYSICAL EXAM: VS:  BP 130/72   Pulse 81   Ht 5' 5 (1.651 m)   Wt 165 lb 12.8 oz (75.2 kg)   SpO2 95%   BMI 27.59 kg/m  , BMI Body mass index is 27.59 kg/m. Last weight:  Wt Readings from Last 3 Encounters:  02/15/24 165 lb 12.8 oz (75.2 kg)  01/30/24 164 lb 6.4 oz (74.6 kg)  11/02/23 162 lb (73.5 kg)     Physical Exam Constitutional:      Appearance: Normal appearance.  Cardiovascular:     Rate and Rhythm: Normal rate and regular rhythm.     Heart sounds: Normal heart sounds.  Pulmonary:     Effort: Pulmonary effort is normal.     Breath sounds: Normal breath sounds.  Musculoskeletal:     Right lower leg: No edema.     Left lower leg: No edema.  Neurological:     Mental Status: She is alert.       EKG:   Recent Labs: 10/26/2023: TSH 0.975 01/25/2024: ALT 25; BUN 8; Creatinine, Ser 0.63; Hemoglobin 14.2; Platelets 226;  Potassium 4.1; Sodium 140    Lipid Panel    Component Value Date/Time   CHOL 89 (L) 01/25/2024 1011   TRIG 106 01/25/2024 1011   HDL 54 01/25/2024 1011   CHOLHDL 1.6 01/25/2024 1011   LDLCALC 15 01/25/2024 1011      Other studies Reviewed: Additional studies/ records that were reviewed today include:  Review of the above records demonstrates:       No data to display            ASSESSMENT AND PLAN:    ICD-10-CM   1. 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    2. Bilateral carotid artery stenosis  I65.23     3. Peripheral vascular disease (HCC)  I73.9     4. Tobacco use  Z72.0     5. Mixed hyperlipidemia  E78.2        Problem List Items Addressed This Visit       Cardiovascular and Mediastinum   Carotid stenosis   Atherosclerosis of native arteries of extremity with intermittent claudication (HCC) - Primary   Peripheral vascular disease (HCC)     Other   Hyperlipidemia   Tobacco use       Disposition:   Return in about 3 months (around 05/17/2024).    Total time spent: 30 minutes  Signed,  Denyse Bathe, MD  02/15/2024 11:51 AM    [facility]

## 2024-02-21 ENCOUNTER — Other Ambulatory Visit: Payer: Self-pay | Admitting: Internal Medicine

## 2024-02-21 DIAGNOSIS — J301 Allergic rhinitis due to pollen: Secondary | ICD-10-CM

## 2024-02-25 ENCOUNTER — Ambulatory Visit
Admission: RE | Admit: 2024-02-25 | Discharge: 2024-02-25 | Disposition: A | Discharge status: Home / Self Care | Source: Ambulatory Visit | Attending: Acute Care | Admitting: Acute Care

## 2024-02-25 DIAGNOSIS — Z87891 Personal history of nicotine dependence: Secondary | ICD-10-CM | POA: Insufficient documentation

## 2024-02-25 DIAGNOSIS — F17210 Nicotine dependence, cigarettes, uncomplicated: Secondary | ICD-10-CM | POA: Insufficient documentation

## 2024-02-25 DIAGNOSIS — Z122 Encounter for screening for malignant neoplasm of respiratory organs: Secondary | ICD-10-CM | POA: Diagnosis not present

## 2024-02-29 ENCOUNTER — Other Ambulatory Visit: Payer: Self-pay | Admitting: Internal Medicine

## 2024-02-29 DIAGNOSIS — I10 Essential (primary) hypertension: Secondary | ICD-10-CM

## 2024-03-19 ENCOUNTER — Other Ambulatory Visit: Payer: Self-pay | Admitting: Acute Care

## 2024-03-19 DIAGNOSIS — Z87891 Personal history of nicotine dependence: Secondary | ICD-10-CM

## 2024-03-19 DIAGNOSIS — Z122 Encounter for screening for malignant neoplasm of respiratory organs: Secondary | ICD-10-CM

## 2024-03-19 DIAGNOSIS — F17210 Nicotine dependence, cigarettes, uncomplicated: Secondary | ICD-10-CM

## 2024-04-29 ENCOUNTER — Other Ambulatory Visit

## 2024-04-29 DIAGNOSIS — E782 Mixed hyperlipidemia: Secondary | ICD-10-CM | POA: Diagnosis not present

## 2024-04-29 DIAGNOSIS — R7303 Prediabetes: Secondary | ICD-10-CM | POA: Diagnosis not present

## 2024-04-30 LAB — LIPID PANEL
Chol/HDL Ratio: 2.1 ratio (ref 0.0–4.4)
Cholesterol, Total: 113 mg/dL (ref 100–199)
HDL: 54 mg/dL (ref >39)
LDL Chol Calc (NIH): 33 mg/dL (ref 0–99)
Triglycerides: 155 mg/dL — ABNORMAL HIGH (ref 0–149)
VLDL Cholesterol Cal: 26 mg/dL (ref 5–40)

## 2024-04-30 LAB — HEMOGLOBIN A1C
Est. average glucose Bld gHb Est-mCnc: 114 mg/dL
Hgb A1c MFr Bld: 5.6 % (ref 4.8–5.6)

## 2024-05-01 ENCOUNTER — Other Ambulatory Visit (INDEPENDENT_AMBULATORY_CARE_PROVIDER_SITE_OTHER): Payer: Self-pay | Admitting: Vascular Surgery

## 2024-05-01 DIAGNOSIS — I6523 Occlusion and stenosis of bilateral carotid arteries: Secondary | ICD-10-CM

## 2024-05-01 DIAGNOSIS — I739 Peripheral vascular disease, unspecified: Secondary | ICD-10-CM

## 2024-05-02 ENCOUNTER — Ambulatory Visit: Admitting: Internal Medicine

## 2024-05-02 ENCOUNTER — Ambulatory Visit: Payer: Self-pay | Admitting: Internal Medicine

## 2024-05-02 ENCOUNTER — Ambulatory Visit
Admission: RE | Admit: 2024-05-02 | Discharge: 2024-05-02 | Disposition: A | Discharge status: Home / Self Care | Source: Ambulatory Visit | Attending: Internal Medicine | Admitting: Internal Medicine

## 2024-05-02 ENCOUNTER — Encounter: Payer: Self-pay | Admitting: Internal Medicine

## 2024-05-02 VITALS — BP 116/58 | HR 79 | Temp 97.9°F | Ht 65.0 in | Wt 165.4 lb

## 2024-05-02 DIAGNOSIS — J301 Allergic rhinitis due to pollen: Secondary | ICD-10-CM

## 2024-05-02 DIAGNOSIS — Z131 Encounter for screening for diabetes mellitus: Secondary | ICD-10-CM

## 2024-05-02 DIAGNOSIS — S43401A Unspecified sprain of right shoulder joint, initial encounter: Secondary | ICD-10-CM

## 2024-05-02 DIAGNOSIS — Z23 Encounter for immunization: Secondary | ICD-10-CM | POA: Diagnosis not present

## 2024-05-02 DIAGNOSIS — M25511 Pain in right shoulder: Secondary | ICD-10-CM | POA: Diagnosis not present

## 2024-05-02 DIAGNOSIS — M19011 Primary osteoarthritis, right shoulder: Secondary | ICD-10-CM | POA: Diagnosis not present

## 2024-05-02 DIAGNOSIS — Z0130 Encounter for examination of blood pressure without abnormal findings: Secondary | ICD-10-CM | POA: Diagnosis not present

## 2024-05-02 MED ORDER — CETIRIZINE HCL 10 MG PO TABS: 10.0000 mg | ORAL_TABLET | Freq: Every day | ORAL | 2 refills | Status: DC

## 2024-05-02 MED ORDER — FLUTICASONE PROPIONATE 50 MCG/ACT NA SUSP: 1.0000 | Freq: Every day | NASAL | 1 refills | Status: AC | PRN

## 2024-05-02 NOTE — Progress Notes (Signed)
 Established Patient Office Visit  Subjective:  Patient ID: Bethany Lozano, female    DOB: 06/19/1951  Age: 73 y.o. MRN: [id number]  Chief Complaint  Patient presents with   Follow-up    3 month follow up    Worsening nocturia here for lab review and medication refills. C/o right shoulder pain after striking it on a door jamb. Pain improved with otc tylenol  and prior celecoxib  rx.     No other concerns at this time.   Past Medical History:  Diagnosis Date   Anxiety    Depression    GERD (gastroesophageal reflux disease)    Heart murmur    Hemorrhoids    Hyperlipidemia    Peripheral vascular disease    Sinusitis    Tobacco use     Past Surgical History:  Procedure Laterality Date   ABDOMINAL HYSTERECTOMY  1991   APPENDECTOMY     BICEPT TENODESIS Left 10/08/2017   Procedure: BICEPS TENODESIS;  Surgeon: Tobie Priest, MD;  Location: [facility];  Service: Orthopedics;  Laterality: Left;   CARDIAC CATHETERIZATION N/A 11/08/2015   Procedure: Left Heart Cath and Coronary Angiography;  Surgeon: Denyse DELENA Bathe, MD;  Location: [facility];  Service: Cardiovascular;  Laterality: N/A;   CARDIAC CATHETERIZATION     Mynx placed in right artery after heart catherization   CAROTID STENT INSERTION Right 05/15/2016   COLONOSCOPY  2014   COLONOSCOPY N/A 12/27/2021   Procedure: COLONOSCOPY;  Surgeon: Maryruth Ole DASEN, MD;  Location: [facility];  Service: Endoscopy;  Laterality: N/A;   ENDARTERECTOMY Right 11/25/2015   Procedure: ENDARTERECTOMY CAROTID;  Surgeon: Selinda GORMAN Gu, MD;  Location: [facility];  Service: Vascular;  Laterality: Right;   ESOPHAGOGASTRODUODENOSCOPY N/A 12/27/2021   Procedure: ESOPHAGOGASTRODUODENOSCOPY (EGD);  Surgeon: Maryruth Ole DASEN, MD;  Location: [facility];  Service: Endoscopy;  Laterality: N/A;   FISSURECTOMY  10/22/14   HEMORRHOIDECTOMY WITH HEMORRHOID BANDING  1991,06/20/14, 10/22/14   PERIPHERAL VASCULAR CATHETERIZATION Right 05/15/2016    Procedure: Carotid PTA/Stent Intervention;  Surgeon: Selinda GORMAN Gu, MD;  Location: [facility];  Service: Cardiovascular;  Laterality: Right;   RESECTION DISTAL CLAVICAL  10/08/2017   Procedure: RESECTION DISTAL CLAVICAL;  Surgeon: Tobie Priest, MD;  Location: [facility];  Service: Orthopedics;;   SHOULDER ARTHROSCOPY WITH SUBACROMIAL DECOMPRESSION Left 10/08/2017   Procedure: SHOULDER ARTHROSCOPY WITH SUBACROMIAL DECOMPRESSION;  Surgeon: Tobie Priest, MD;  Location: [facility];  Service: Orthopedics;  Laterality: Left;   SHOULDER OPEN ROTATOR CUFF REPAIR Left 10/08/2017   Procedure: ROTATOR CUFF REPAIR SHOULDER OPEN Extensive Glenoid Humeral debridement;  Surgeon: Tobie Priest, MD;  Location: [facility];  Service: Orthopedics;  Laterality: Left;   TUBAL LIGATION  1977    Social History   Socioeconomic History   Marital status: Single    Spouse name: Not on file   Number of children: Not on file   Years of education: Not on file   Highest education level: Not on file  Occupational History   Occupation: retired  Tobacco Use   Smoking status: Every Day    Current packs/day: 1.00    Average packs/day: 1 pack/day for 45.0 years (45.0 ttl pk-yrs)    Types: Cigarettes   Smokeless tobacco: Never   Tobacco comments:    Pt reports not ready to quit after education provided.  Vaping Use   Vaping status: Never Used  Substance and Sexual Activity   Alcohol use: Not Currently    Alcohol/week: 0.0 standard drinks of  alcohol   Drug use: No   Sexual activity: Not Currently    Birth control/protection: Post-menopausal  Other Topics Concern   Not on file  Social History Narrative   Not on file   Social Drivers of Health   Financial Resource Strain: Not on file  Food Insecurity: Not on file  Transportation Needs: Not on file  Physical Activity: Not on file  Stress: Not on file  Social Connections: Not on file  Intimate Partner Violence: Not on file    Family History  Problem Relation Age  of Onset   Hypertension Mother    Osteoporosis Mother    Heart attack Father    Diabetes Brother    Breast cancer Neg Hx     Allergies  Allergen Reactions   Codeine Nausea Only   Latex     Other reaction(Arish Redner): Other (See Comments)   Paxlovid  [Nirmatrelvir-Ritonavir]     Nausea and vomiting   Adhesive [Tape] Itching and Rash   Penicillins Rash    Tolerates ampicillin  Has patient had a PCN reaction causing immediate rash, facial/tongue/throat swelling, SOB or lightheadedness with hypotension: no Has patient had a PCN reaction causing severe rash involving mucus membranes or skin necrosis: no Has patient had a PCN reaction that required hospitalization {no Has patient had a PCN reaction occurring within the last 10 years: no If all of the above answers are NO, then may proceed with Cephalosporin use.    Outpatient Medications Prior to Visit  Medication Sig   acetaminophen  (TYLENOL ) 500 MG tablet Take 500 mg by mouth every 6 (six) hours as needed for moderate pain.   aspirin  81 MG EC tablet Take 81 mg by mouth daily.   chlorthalidone  (HYGROTON ) 25 MG tablet TAKE 1 TABLET (25 MG TOTAL) BY MOUTH DAILY.   Cholecalciferol  (VITAMIN D3) 5000 units CAPS Take 1,000 Units by mouth daily.    clonazePAM  (KLONOPIN ) 0.5 MG tablet Take 1 tablet (0.5 mg total) by mouth daily.   clopidogrel  (PLAVIX ) 75 MG tablet TAKE 1 TABLET BY MOUTH EVERY DAY   cyanocobalamin (VITAMIN B12) 1000 MCG tablet Take 1,000 mcg by mouth daily.   DULoxetine  (CYMBALTA ) 60 MG capsule TAKE 1 CAPSULE BY MOUTH EVERY DAY   estradiol (ESTRACE) 0.5 MG tablet TAKE 1 TABLET BY MOUTH EVERY DAY   Evolocumab (REPATHA SURECLICK) 140 MG/ML SOAJ INJECT 1 INJECTION EVERY 2 WEEKS   Magnesium  100 MG TABS Take by mouth.   rosuvastatin  (CRESTOR ) 10 MG tablet TAKE 1 TABLET BY MOUTH EVERY DAY   valsartan (DIOVAN) 80 MG tablet TAKE 1 TABLET BY MOUTH EVERY DAY IN THE MORNING   [DISCONTINUED] cetirizine  (ZYRTEC  ALLERGY) 10 MG tablet Take 1  tablet (10 mg total) by mouth daily.   [DISCONTINUED] fluticasone  (FLONASE ) 50 MCG/ACT nasal spray PLACE 1 SPRAY INTO BOTH NOSTRILS DAILY AS NEEDED FOR ALLERGIES OR RHINITIS.   No facility-administered medications prior to visit.    Review of Systems  Musculoskeletal:  Positive for joint pain (right shoulder).       Objective:   BP (!) 116/58   Pulse 79   Temp 97.9 F (36.6 C)   Ht 5' 5 (1.651 m)   Wt 165 lb 6.4 oz (75 kg)   SpO2 97%   BMI 27.52 kg/m   Vitals:   05/02/24 1057  BP: (!) 116/58  Pulse: 79  Temp: 97.9 F (36.6 C)  Height: 5' 5 (1.651 m)  Weight: 165 lb 6.4 oz (75 kg)  SpO2: 97%  BMI (Calculated): 27.52    Physical Exam Vitals reviewed.  Constitutional:      General: She is not in acute distress. HENT:     Head: Normocephalic.     Nose: Nose normal.     Mouth/Throat:     Mouth: Mucous membranes are moist.  Eyes:     Extraocular Movements: Extraocular movements intact.     Pupils: Pupils are equal, round, and reactive to light.  Cardiovascular:     Rate and Rhythm: Normal rate and regular rhythm.     Heart sounds: Murmur heard.     Crescendo decrescendo systolic murmur is present with a grade of 3/6.  Pulmonary:     Effort: Pulmonary effort is normal.     Breath sounds: No rhonchi or rales.  Abdominal:     General: Abdomen is flat.     Palpations: There is no hepatomegaly, splenomegaly or mass.  Musculoskeletal:        General: Normal range of motion.     Cervical back: Normal range of motion. No tenderness.  Skin:    General: Skin is warm and dry.  Neurological:     General: No focal deficit present.     Mental Status: She is alert and oriented to person, place, and time.     Cranial Nerves: No cranial nerve deficit.     Motor: No weakness.  Psychiatric:        Mood and Affect: Mood normal.        Behavior: Behavior normal.      No results found for any visits on 05/02/24.  Recent Results (from the past 2160 hours)  Lipid  panel     Status: Abnormal   Collection Time: 04/29/24 10:29 AM  Result Value Ref Range   Cholesterol, Total 113 100 - 199 mg/dL   Triglycerides 844 (H) 0 - 149 mg/dL   HDL 54 >60 mg/dL   VLDL Cholesterol Cal 26 5 - 40 mg/dL   LDL Chol Calc (NIH) 33 0 - 99 mg/dL   Chol/HDL Ratio 2.1 0.0 - 4.4 ratio    Comment:                                   T. Chol/HDL Ratio                                             Men  Women                               1/2 Avg.Risk  3.4    3.3                                   Avg.Risk  5.0    4.4                                2X Avg.Risk  9.6    7.1                                3X Avg.Risk 23.4  11.0   Hemoglobin A1c     Status: None   Collection Time: 04/29/24 10:29 AM  Result Value Ref Range   Hgb A1c MFr Bld 5.6 4.8 - 5.6 %    Comment:          Prediabetes: 5.7 - 6.4          Diabetes: >6.4          Glycemic control for adults with diabetes: <7.0    Est. average glucose Bld gHb Est-mCnc 114 mg/dL      Assessment & Plan:  Kaydon was seen today for follow-up.  Sprain of right shoulder, unspecified shoulder sprain type, initial encounter -     DG Chest 2 View; Future  Seasonal allergic rhinitis due to pollen -     Fluticasone  Propionate; Place 1 spray into both nostrils daily as needed for allergies or rhinitis.  Dispense: 48 mL; Refill: 1 -     Cetirizine  HCl; Take 1 tablet (10 mg total) by mouth daily.  Dispense: 30 tablet; Refill: 2    Problem List Items Addressed This Visit   None Visit Diagnoses       Sprain of right shoulder, unspecified shoulder sprain type, initial encounter    -  Primary   Relevant Orders   DG Chest 2 View     Seasonal allergic rhinitis due to pollen       Relevant Medications   fluticasone  (FLONASE ) 50 MCG/ACT nasal spray   cetirizine  (ZYRTEC  ALLERGY) 10 MG tablet       Return in about 3 months (around 08/02/2024) for fu with labs prior and RTC if fails to improve.   Total time spent: 20 minutes. This  time includes review of previous notes and results and patient face to face interaction during today'Bethany Lozano visit.    Sherrill Cinderella Perry, MD  05/02/2024   This document may have been prepared by [facility] Voice Recognition software and as such may include unintentional dictation errors.

## 2024-05-06 ENCOUNTER — Ambulatory Visit (INDEPENDENT_AMBULATORY_CARE_PROVIDER_SITE_OTHER)

## 2024-05-06 ENCOUNTER — Ambulatory Visit (INDEPENDENT_AMBULATORY_CARE_PROVIDER_SITE_OTHER): Admitting: Vascular Surgery

## 2024-05-06 ENCOUNTER — Encounter (INDEPENDENT_AMBULATORY_CARE_PROVIDER_SITE_OTHER): Payer: Self-pay | Admitting: Vascular Surgery

## 2024-05-06 VITALS — BP 124/71 | HR 67 | Resp 18 | Wt 166.2 lb

## 2024-05-06 DIAGNOSIS — E782 Mixed hyperlipidemia: Secondary | ICD-10-CM

## 2024-05-06 DIAGNOSIS — I10 Essential (primary) hypertension: Secondary | ICD-10-CM

## 2024-05-06 DIAGNOSIS — I6523 Occlusion and stenosis of bilateral carotid arteries: Secondary | ICD-10-CM | POA: Diagnosis not present

## 2024-05-06 DIAGNOSIS — H2513 Age-related nuclear cataract, bilateral: Secondary | ICD-10-CM | POA: Diagnosis not present

## 2024-05-06 DIAGNOSIS — H43813 Vitreous degeneration, bilateral: Secondary | ICD-10-CM | POA: Diagnosis not present

## 2024-05-06 DIAGNOSIS — I739 Peripheral vascular disease, unspecified: Secondary | ICD-10-CM | POA: Diagnosis not present

## 2024-05-06 DIAGNOSIS — I70211 Atherosclerosis of native arteries of extremities with intermittent claudication, right leg: Secondary | ICD-10-CM | POA: Diagnosis not present

## 2024-05-06 NOTE — Assessment & Plan Note (Signed)
 Carotid duplex today reveals a widely patent right carotid stent and stable 1 to 39% left ICA stenosis.  About 8 years after carotid stenting for fairly early recurrence after endarterectomy.  Doing well.  As she had an early recurrence we have always considered her high risk and will continue her on dual antiplatelet therapy and 19-month follow-ups.

## 2024-05-06 NOTE — Progress Notes (Signed)
 MRN : [id number]  Bethany Lozano is a 73 y.o. (08-19-1950) female who presents with chief complaint of  Chief Complaint  Patient presents with   Follow-up    6 month carotid and abi u/s follow up  .  History of Present Illness: Patient returns today in follow up of multiple vascular issues.  She is doing well.  She does still have claudication symptoms on the right leg and these are reasonably stable.  She started taking magnesium  supplements and this seems to have helped her leg cramps quite a bit.  Her ABIs today are slightly improved to 0.67 on the right and stable and normal at 1.1 on the left. She is also followed for carotid disease.  She is about 8 years status post right carotid stent for relatively early recurrence after an endarterectomy.  She is doing well.  She denies any focal neurologic symptoms. Specifically, the patient denies amaurosis fugax, speech or swallowing difficulties, or arm or leg weakness or numbness. Carotid duplex today reveals a widely patent right carotid stent and stable 1 to 39% left ICA stenosis.  Current Outpatient Medications  Medication Sig Dispense Refill   acetaminophen  (TYLENOL ) 500 MG tablet Take 500 mg by mouth every 6 (six) hours as needed for moderate pain.     aspirin  81 MG EC tablet Take 81 mg by mouth daily.     celecoxib  (CELEBREX ) 200 MG capsule Take 200 mg by mouth daily as needed (joint pain).     cetirizine  (ZYRTEC  ALLERGY) 10 MG tablet Take 1 tablet (10 mg total) by mouth daily. 30 tablet 2   chlorthalidone  (HYGROTON ) 25 MG tablet TAKE 1 TABLET (25 MG TOTAL) BY MOUTH DAILY. 90 tablet 1   Cholecalciferol  (VITAMIN D3) 5000 units CAPS Take 1,000 Units by mouth daily.      clonazePAM  (KLONOPIN ) 0.5 MG tablet Take 1 tablet (0.5 mg total) by mouth daily. 30 tablet 2   clopidogrel  (PLAVIX ) 75 MG tablet TAKE 1 TABLET BY MOUTH EVERY DAY 90 tablet 3   cyanocobalamin (VITAMIN B12) 1000 MCG tablet Take 1,000 mcg by mouth daily.     DULoxetine   (CYMBALTA ) 60 MG capsule TAKE 1 CAPSULE BY MOUTH EVERY DAY 90 capsule 1   estradiol (ESTRACE) 0.5 MG tablet TAKE 1 TABLET BY MOUTH EVERY DAY 90 tablet 1   Evolocumab (REPATHA SURECLICK) 140 MG/ML SOAJ INJECT 1 INJECTION EVERY 2 WEEKS 90 mL 1   fluticasone  (FLONASE ) 50 MCG/ACT nasal spray Place 1 spray into both nostrils daily as needed for allergies or rhinitis. 48 mL 1   Magnesium  100 MG TABS Take by mouth.     rosuvastatin  (CRESTOR ) 10 MG tablet TAKE 1 TABLET BY MOUTH EVERY DAY 90 tablet 1   valsartan (DIOVAN) 80 MG tablet TAKE 1 TABLET BY MOUTH EVERY DAY IN THE MORNING 90 tablet 1   No current facility-administered medications for this visit.    Past Medical History:  Diagnosis Date   Anxiety    Depression    GERD (gastroesophageal reflux disease)    Heart murmur    Hemorrhoids    Hyperlipidemia    Peripheral vascular disease    Sinusitis    Tobacco use     Past Surgical History:  Procedure Laterality Date   ABDOMINAL HYSTERECTOMY  1991   APPENDECTOMY     BICEPT TENODESIS Left 10/08/2017   Procedure: BICEPS TENODESIS;  Surgeon: Tobie Priest, MD;  Location: [facility];  Service: Orthopedics;  Laterality: Left;   CARDIAC  CATHETERIZATION N/A 11/08/2015   Procedure: Left Heart Cath and Coronary Angiography;  Surgeon: Denyse DELENA Bathe, MD;  Location: [facility];  Service: Cardiovascular;  Laterality: N/A;   CARDIAC CATHETERIZATION     Mynx placed in right artery after heart catherization   CAROTID STENT INSERTION Right 05/15/2016   COLONOSCOPY  2014   COLONOSCOPY N/A 12/27/2021   Procedure: COLONOSCOPY;  Surgeon: Maryruth Ole DASEN, MD;  Location: [facility];  Service: Endoscopy;  Laterality: N/A;   ENDARTERECTOMY Right 11/25/2015   Procedure: ENDARTERECTOMY CAROTID;  Surgeon: Selinda GORMAN Gu, MD;  Location: [facility];  Service: Vascular;  Laterality: Right;   ESOPHAGOGASTRODUODENOSCOPY N/A 12/27/2021   Procedure: ESOPHAGOGASTRODUODENOSCOPY (EGD);  Surgeon: Maryruth Ole DASEN,  MD;  Location: [facility];  Service: Endoscopy;  Laterality: N/A;   FISSURECTOMY  10/22/14   HEMORRHOIDECTOMY WITH HEMORRHOID BANDING  1991,06/20/14, 10/22/14   PERIPHERAL VASCULAR CATHETERIZATION Right 05/15/2016   Procedure: Carotid PTA/Stent Intervention;  Surgeon: Selinda GORMAN Gu, MD;  Location: [facility];  Service: Cardiovascular;  Laterality: Right;   RESECTION DISTAL CLAVICAL  10/08/2017   Procedure: RESECTION DISTAL CLAVICAL;  Surgeon: Tobie Priest, MD;  Location: [facility];  Service: Orthopedics;;   SHOULDER ARTHROSCOPY WITH SUBACROMIAL DECOMPRESSION Left 10/08/2017   Procedure: SHOULDER ARTHROSCOPY WITH SUBACROMIAL DECOMPRESSION;  Surgeon: Tobie Priest, MD;  Location: [facility];  Service: Orthopedics;  Laterality: Left;   SHOULDER OPEN ROTATOR CUFF REPAIR Left 10/08/2017   Procedure: ROTATOR CUFF REPAIR SHOULDER OPEN Extensive Glenoid Humeral debridement;  Surgeon: Tobie Priest, MD;  Location: [facility];  Service: Orthopedics;  Laterality: Left;   TUBAL LIGATION  1977     Social History   Tobacco Use   Smoking status: Every Day    Current packs/day: 1.00    Average packs/day: 1 pack/day for 45.0 years (45.0 ttl pk-yrs)    Types: Cigarettes   Smokeless tobacco: Never   Tobacco comments:    Pt reports not ready to quit after education provided.  Vaping Use   Vaping status: Never Used  Substance Use Topics   Alcohol use: Not Currently    Alcohol/week: 0.0 standard drinks of alcohol   Drug use: No      Family History  Problem Relation Age of Onset   Hypertension Mother    Osteoporosis Mother    Heart attack Father    Diabetes Brother    Breast cancer Neg Hx      Allergies  Allergen Reactions   Codeine Nausea Only   Latex     Other reaction(s): Other (See Comments)   Paxlovid  [Nirmatrelvir-Ritonavir]     Nausea and vomiting   Adhesive [Tape] Itching and Rash   Penicillins Rash    Tolerates ampicillin  Has patient had a PCN reaction causing immediate rash,  facial/tongue/throat swelling, SOB or lightheadedness with hypotension: no Has patient had a PCN reaction causing severe rash involving mucus membranes or skin necrosis: no Has patient had a PCN reaction that required hospitalization {no Has patient had a PCN reaction occurring within the last 10 years: no If all of the above answers are NO, then may proceed with Cephalosporin use.     REVIEW OF SYSTEMS (Negative unless checked)   Constitutional: [] Weight loss  [] Fever  [] Chills Cardiac: [] Chest pain   [] Chest pressure   [] Palpitations   [] Shortness of breath when laying flat   [] Shortness of breath at rest   [] Shortness of breath with exertion. Vascular:  [x] Pain in legs with walking   [] Pain  in legs at rest   [] Pain in legs when laying flat   [x] Claudication   [] Pain in feet when walking  [] Pain in feet at rest  [] Pain in feet when laying flat   [] History of DVT   [] Phlebitis   [] Swelling in legs   [] Varicose veins   [] Non-healing ulcers Pulmonary:   [] Uses home oxygen   [] Productive cough   [] Hemoptysis   [] Wheeze  [] COPD   [] Asthma Neurologic:  [x] Dizziness  [] Blackouts   [] Seizures   [] History of stroke   [] History of TIA  [] Aphasia   [x] Temporary blindness   [] Dysphagia   [] Weakness or numbness in arms   [x] Weakness or numbness in legs Musculoskeletal:  [] Arthritis   [] Joint swelling   [] Joint pain   [] Low back pain Hematologic:  [] Easy bruising  [] Easy bleeding   [] Hypercoagulable state   [] Anemic  [] Hepatitis Gastrointestinal:  [] Blood in stool   [] Vomiting blood  [] Gastroesophageal reflux/heartburn   [] Difficulty swallowing. Genitourinary:  [] Chronic kidney disease   [] Difficult urination  [] Frequent urination  [] Burning with urination   [] Blood in urine Skin:  [] Rashes   [] Ulcers   [] Wounds Psychological:  [x] History of anxiety   []  History of major depression  Physical Examination  BP 124/71   Pulse 67   Resp 18   Wt 166 lb 3.2 oz (75.4 kg)   BMI 27.66 kg/m  Gen:  WD/WN,  NAD Head: [place]/AT, No temporalis wasting. Ear/Nose/Throat: Hearing grossly intact, nares w/o erythema or drainage Eyes: Conjunctiva clear. Sclera non-icteric Neck: Supple.  Trachea midline Pulmonary:  Good air movement, no use of accessory muscles.  Cardiac: RRR, no JVD Vascular:  Vessel Right Left  Radial Palpable Palpable                          PT 1+ Palpable 1+ Palpable  DP 1+ Palpable 2+ Palpable   Gastrointestinal: soft, non-tender/non-distended. No guarding/reflex.  Musculoskeletal: M/S 5/5 throughout.  No deformity or atrophy. No edema. Neurologic: Sensation grossly intact in extremities.  Symmetrical.  Speech is fluent.  Psychiatric: Judgment intact, Mood & affect appropriate for pt's clinical situation. Dermatologic: No rashes or ulcers noted.  No cellulitis or open wounds.      Labs Recent Results (from the past 2160 hours)  Lipid panel     Status: Abnormal   Collection Time: 04/29/24 10:29 AM  Result Value Ref Range   Cholesterol, Total 113 100 - 199 mg/dL   Triglycerides 844 (H) 0 - 149 mg/dL   HDL 54 >60 mg/dL   VLDL Cholesterol Cal 26 5 - 40 mg/dL   LDL Chol Calc (NIH) 33 0 - 99 mg/dL   Chol/HDL Ratio 2.1 0.0 - 4.4 ratio    Comment:                                   T. Chol/HDL Ratio                                             Men  Women                               1/2 Avg.Risk  3.4    3.3  Avg.Risk  5.0    4.4                                2X Avg.Risk  9.6    7.1                                3X Avg.Risk 23.4   11.0   Hemoglobin A1c     Status: None   Collection Time: 04/29/24 10:29 AM  Result Value Ref Range   Hgb A1c MFr Bld 5.6 4.8 - 5.6 %    Comment:          Prediabetes: 5.7 - 6.4          Diabetes: >6.4          Glycemic control for adults with diabetes: <7.0    Est. average glucose Bld gHb Est-mCnc 114 mg/dL    Radiology DG Shoulder Right Result Date: 05/03/2024 CLINICAL DATA:  RIGHT shoulder sprain  status post injury. Unable to raise RIGHT arm. EXAM: RIGHT SHOULDER - 2+ VIEW COMPARISON:  None available FINDINGS: Extensive irregularity of the greater tuberosity indicative of rotator cuff tendinopathy. Moderate degenerative changes of the acromioclavicular joint. Mild degenerative changes of the glenohumeral joint. Vascular stent seen in the RIGHT neck. IMPRESSION: 1. No acute abnormality of the right shoulder. 2. Advanced degenerative changes of the rotator cuff and acromioclavicular joint. Electronically Signed   By: Aliene Lloyd M.D.   On: 05/03/2024 08:04    Assessment/Plan  Atherosclerosis of native arteries of extremity with intermittent claudication Her ABIs today are slightly improved to 0.67 on the right and stable and normal at 1.1 on the left.  Location suboptimal for straight endovascular therapy and her symptoms are stable.  We will continue Plavix  and aspirin .  She is on Repatha now.  Follow-up in 6 months.  Carotid stenosis Carotid duplex today reveals a widely patent right carotid stent and stable 1 to 39% left ICA stenosis.  About 8 years after carotid stenting for fairly early recurrence after endarterectomy.  Doing well.  As she had an early recurrence we have always considered her high risk and will continue her on dual antiplatelet therapy and 20-month follow-ups.  Essential hypertension blood pressure control important in reducing the progression of atherosclerotic disease. On appropriate oral medications.     Hyperlipidemia lipid control important in reducing the progression of atherosclerotic disease. Continue statin therapy  Selinda Gu, MD  05/06/2024 12:44 PM    This note was created with Dragon medical transcription system.  Any errors from dictation are purely unintentional

## 2024-05-06 NOTE — Assessment & Plan Note (Signed)
 Her ABIs today are slightly improved to 0.67 on the right and stable and normal at 1.1 on the left.  Location suboptimal for straight endovascular therapy and her symptoms are stable.  We will continue Plavix  and aspirin .  She is on Repatha now.  Follow-up in 6 months.

## 2024-05-07 ENCOUNTER — Ambulatory Visit: Payer: Self-pay | Admitting: Internal Medicine

## 2024-05-09 LAB — VAS US ABI WITH/WO TBI
Left ABI: 1.11
Right ABI: 0.67

## 2024-05-29 ENCOUNTER — Other Ambulatory Visit: Payer: Self-pay | Admitting: Internal Medicine

## 2024-05-29 DIAGNOSIS — E785 Hyperlipidemia, unspecified: Secondary | ICD-10-CM

## 2024-05-29 DIAGNOSIS — I10 Essential (primary) hypertension: Secondary | ICD-10-CM

## 2024-05-30 ENCOUNTER — Other Ambulatory Visit: Payer: Self-pay | Admitting: Internal Medicine

## 2024-05-30 DIAGNOSIS — F418 Other specified anxiety disorders: Secondary | ICD-10-CM

## 2024-05-30 DIAGNOSIS — N951 Menopausal and female climacteric states: Secondary | ICD-10-CM

## 2024-06-01 ENCOUNTER — Other Ambulatory Visit: Payer: Self-pay | Admitting: Internal Medicine

## 2024-06-01 DIAGNOSIS — F418 Other specified anxiety disorders: Secondary | ICD-10-CM

## 2024-06-20 ENCOUNTER — Ambulatory Visit: Admitting: Cardiovascular Disease

## 2024-06-20 VITALS — BP 127/77 | HR 83 | Ht 65.0 in | Wt 163.8 lb

## 2024-06-20 DIAGNOSIS — I739 Peripheral vascular disease, unspecified: Secondary | ICD-10-CM | POA: Diagnosis not present

## 2024-06-20 DIAGNOSIS — R7303 Prediabetes: Secondary | ICD-10-CM

## 2024-06-20 DIAGNOSIS — I70211 Atherosclerosis of native arteries of extremities with intermittent claudication, right leg: Secondary | ICD-10-CM

## 2024-06-20 DIAGNOSIS — I6523 Occlusion and stenosis of bilateral carotid arteries: Secondary | ICD-10-CM | POA: Diagnosis not present

## 2024-06-20 DIAGNOSIS — I10 Essential (primary) hypertension: Secondary | ICD-10-CM

## 2024-06-20 DIAGNOSIS — F17210 Nicotine dependence, cigarettes, uncomplicated: Secondary | ICD-10-CM

## 2024-06-20 DIAGNOSIS — R0602 Shortness of breath: Secondary | ICD-10-CM

## 2024-06-20 DIAGNOSIS — E782 Mixed hyperlipidemia: Secondary | ICD-10-CM

## 2024-06-20 DIAGNOSIS — Z720 Tobacco use: Secondary | ICD-10-CM

## 2024-06-20 NOTE — Progress Notes (Signed)
 "     Cardiology Office Note   Date:  06/20/2024   ID:  Bethany Lozano, DOB August 31, 1950, MRN [id number]  PCP:  Albina GORMAN Dine, MD  Cardiologist:  Denyse Bathe, MD      History of Present Illness: Bethany Lozano is a 73 y.o. female who presents for  Chief Complaint  Patient presents with   Follow-up    3 month follow up    Neck pain, going to see orthopedic. Has SOB. Had stents in legs and carotids.      Past Medical History:  Diagnosis Date   Anxiety    Depression    GERD (gastroesophageal reflux disease)    Heart murmur    Hemorrhoids    Hyperlipidemia    Peripheral vascular disease    Sinusitis    Tobacco use      Past Surgical History:  Procedure Laterality Date   ABDOMINAL HYSTERECTOMY  1991   APPENDECTOMY     BICEPT TENODESIS Left 10/08/2017   Procedure: BICEPS TENODESIS;  Surgeon: Tobie Priest, MD;  Location: [facility];  Service: Orthopedics;  Laterality: Left;   CARDIAC CATHETERIZATION N/A 11/08/2015   Procedure: Left Heart Cath and Coronary Angiography;  Surgeon: Denyse DELENA Bathe, MD;  Location: [facility];  Service: Cardiovascular;  Laterality: N/A;   CARDIAC CATHETERIZATION     Mynx placed in right artery after heart catherization   CAROTID STENT INSERTION Right 05/15/2016   COLONOSCOPY  2014   COLONOSCOPY N/A 12/27/2021   Procedure: COLONOSCOPY;  Surgeon: Maryruth Ole DASEN, MD;  Location: [facility];  Service: Endoscopy;  Laterality: N/A;   ENDARTERECTOMY Right 11/25/2015   Procedure: ENDARTERECTOMY CAROTID;  Surgeon: Selinda GORMAN Gu, MD;  Location: [facility];  Service: Vascular;  Laterality: Right;   ESOPHAGOGASTRODUODENOSCOPY N/A 12/27/2021   Procedure: ESOPHAGOGASTRODUODENOSCOPY (EGD);  Surgeon: Maryruth Ole DASEN, MD;  Location: [facility];  Service: Endoscopy;  Laterality: N/A;   FISSURECTOMY  10/22/14   HEMORRHOIDECTOMY WITH HEMORRHOID BANDING  1991,06/20/14, 10/22/14   PERIPHERAL VASCULAR CATHETERIZATION Right 05/15/2016   Procedure:  Carotid PTA/Stent Intervention;  Surgeon: Selinda GORMAN Gu, MD;  Location: [facility];  Service: Cardiovascular;  Laterality: Right;   RESECTION DISTAL CLAVICAL  10/08/2017   Procedure: RESECTION DISTAL CLAVICAL;  Surgeon: Tobie Priest, MD;  Location: [facility];  Service: Orthopedics;;   SHOULDER ARTHROSCOPY WITH SUBACROMIAL DECOMPRESSION Left 10/08/2017   Procedure: SHOULDER ARTHROSCOPY WITH SUBACROMIAL DECOMPRESSION;  Surgeon: Tobie Priest, MD;  Location: [facility];  Service: Orthopedics;  Laterality: Left;   SHOULDER OPEN ROTATOR CUFF REPAIR Left 10/08/2017   Procedure: ROTATOR CUFF REPAIR SHOULDER OPEN Extensive Glenoid Humeral debridement;  Surgeon: Tobie Priest, MD;  Location: [facility];  Service: Orthopedics;  Laterality: Left;   TUBAL LIGATION  1977     Current Outpatient Medications  Medication Sig Dispense Refill   acetaminophen  (TYLENOL ) 500 MG tablet Take 500 mg by mouth every 6 (six) hours as needed for moderate pain.     aspirin  81 MG EC tablet Take 81 mg by mouth daily.     celecoxib  (CELEBREX ) 200 MG capsule Take 200 mg by mouth daily as needed (joint pain).     cetirizine  (ZYRTEC  ALLERGY) 10 MG tablet Take 1 tablet (10 mg total) by mouth daily. 30 tablet 2   chlorthalidone  (HYGROTON ) 25 MG tablet TAKE 1 TABLET (25 MG TOTAL) BY MOUTH DAILY. 90 tablet 1   Cholecalciferol  (VITAMIN D3) 5000 units CAPS Take 1,000 Units by mouth daily.  clonazePAM  (KLONOPIN ) 0.5 MG tablet TAKE 1 TABLET BY MOUTH EVERY DAY 30 tablet 1   clopidogrel  (PLAVIX ) 75 MG tablet TAKE 1 TABLET BY MOUTH EVERY DAY 90 tablet 3   cyanocobalamin (VITAMIN B12) 1000 MCG tablet Take 1,000 mcg by mouth daily.     DULoxetine  (CYMBALTA ) 60 MG capsule TAKE 1 CAPSULE BY MOUTH EVERY DAY 90 capsule 1   estradiol (ESTRACE) 0.5 MG tablet TAKE 1 TABLET BY MOUTH EVERY DAY 90 tablet 1   Evolocumab (REPATHA SURECLICK) 140 MG/ML SOAJ INJECT 1 INJECTION EVERY 2 WEEKS 90 mL 1   fluticasone  (FLONASE ) 50 MCG/ACT nasal spray Place 1  spray into both nostrils daily as needed for allergies or rhinitis. 48 mL 1   Magnesium  100 MG TABS Take by mouth.     rosuvastatin  (CRESTOR ) 10 MG tablet TAKE 1 TABLET BY MOUTH EVERY DAY 90 tablet 1   valsartan (DIOVAN) 80 MG tablet TAKE 1 TABLET BY MOUTH EVERY DAY IN THE MORNING 90 tablet 1   No current facility-administered medications for this visit.    Allergies:   Codeine, Latex, Paxlovid  [nirmatrelvir-ritonavir], Adhesive [tape], and Penicillins    Social History:   reports that she has been smoking cigarettes. She has a 45 pack-year smoking history. She has never used smokeless tobacco. She reports that she does not currently use alcohol. She reports that she does not use drugs.   Family History:  family history includes Diabetes in her brother; Heart attack in her father; Hypertension in her mother; Osteoporosis in her mother.    ROS:     Review of Systems  Constitutional: Negative.   HENT: Negative.    Eyes: Negative.   Respiratory: Negative.    Gastrointestinal: Negative.   Genitourinary: Negative.   Musculoskeletal: Negative.   Skin: Negative.   Neurological: Negative.   Endo/Heme/Allergies: Negative.   Psychiatric/Behavioral: Negative.    All other systems reviewed and are negative.     All other systems are reviewed and negative.    PHYSICAL EXAM: VS:  BP 127/77   Pulse 83   Ht 5' 5 (1.651 m)   Wt 163 lb 12.8 oz (74.3 kg)   SpO2 97%   BMI 27.26 kg/m  , BMI Body mass index is 27.26 kg/m. Last weight:  Wt Readings from Last 3 Encounters:  06/20/24 163 lb 12.8 oz (74.3 kg)  05/06/24 166 lb 3.2 oz (75.4 kg)  05/02/24 165 lb 6.4 oz (75 kg)     Physical Exam Constitutional:      Appearance: Normal appearance.  Cardiovascular:     Rate and Rhythm: Normal rate and regular rhythm.     Heart sounds: Normal heart sounds.  Pulmonary:     Effort: Pulmonary effort is normal.     Breath sounds: Normal breath sounds.  Musculoskeletal:     Right lower leg:  No edema.     Left lower leg: No edema.  Neurological:     Mental Status: She is alert.       EKG:   Recent Labs: 10/26/2023: TSH 0.975 01/25/2024: ALT 25; BUN 8; Creatinine, Ser 0.63; Hemoglobin 14.2; Platelets 226; Potassium 4.1; Sodium 140    Lipid Panel    Component Value Date/Time   CHOL 113 04/29/2024 1029   TRIG 155 (H) 04/29/2024 1029   HDL 54 04/29/2024 1029   CHOLHDL 2.1 04/29/2024 1029   LDLCALC 33 04/29/2024 1029      Other studies Reviewed: Additional studies/ records that were reviewed today include:  Review of the above records demonstrates:       No data to display            ASSESSMENT AND PLAN:    ICD-10-CM   1. Essential (primary) hypertension  I10 PCV ECHOCARDIOGRAM COMPLETE   BP stable    2. Bilateral carotid artery stenosis  I65.23 PCV ECHOCARDIOGRAM COMPLETE    3. Atherosclerosis of native artery of right lower extremity with intermittent claudication  I70.211 PCV ECHOCARDIOGRAM COMPLETE    4. Peripheral vascular disease  I73.9 PCV ECHOCARDIOGRAM COMPLETE    5. Tobacco use  Z72.0 PCV ECHOCARDIOGRAM COMPLETE    6. Mixed hyperlipidemia  E78.2 PCV ECHOCARDIOGRAM COMPLETE    7. Prediabetes  R73.03 PCV ECHOCARDIOGRAM COMPLETE    8. SOB (shortness of breath)  R06.02 PCV ECHOCARDIOGRAM COMPLETE       Problem List Items Addressed This Visit       Cardiovascular and Mediastinum   Carotid stenosis   Relevant Orders   PCV ECHOCARDIOGRAM COMPLETE   Atherosclerosis of native arteries of extremity with intermittent claudication   Relevant Orders   PCV ECHOCARDIOGRAM COMPLETE   Peripheral vascular disease   Relevant Orders   PCV ECHOCARDIOGRAM COMPLETE     Other   Hyperlipidemia   Relevant Orders   PCV ECHOCARDIOGRAM COMPLETE   Tobacco use   Relevant Orders   PCV ECHOCARDIOGRAM COMPLETE   Prediabetes   Relevant Orders   PCV ECHOCARDIOGRAM COMPLETE   Other Visit Diagnoses       Essential (primary) hypertension    -  Primary    BP stable   Relevant Orders   PCV ECHOCARDIOGRAM COMPLETE     SOB (shortness of breath)       Relevant Orders   PCV ECHOCARDIOGRAM COMPLETE          Disposition:   Return in about 5 weeks (around 07/25/2024) for echo and f/u.    Total time spent: 35 minutes  Signed,  Denyse Bathe, MD  06/20/2024 11:31 AM    [facility] "

## 2024-07-18 ENCOUNTER — Other Ambulatory Visit

## 2024-07-18 DIAGNOSIS — I70211 Atherosclerosis of native arteries of extremities with intermittent claudication, right leg: Secondary | ICD-10-CM

## 2024-07-18 DIAGNOSIS — R0602 Shortness of breath: Secondary | ICD-10-CM

## 2024-07-18 DIAGNOSIS — I361 Nonrheumatic tricuspid (valve) insufficiency: Secondary | ICD-10-CM

## 2024-07-18 DIAGNOSIS — I351 Nonrheumatic aortic (valve) insufficiency: Secondary | ICD-10-CM

## 2024-07-18 DIAGNOSIS — R7303 Prediabetes: Secondary | ICD-10-CM

## 2024-07-18 DIAGNOSIS — I6523 Occlusion and stenosis of bilateral carotid arteries: Secondary | ICD-10-CM

## 2024-07-18 DIAGNOSIS — I340 Nonrheumatic mitral (valve) insufficiency: Secondary | ICD-10-CM | POA: Diagnosis not present

## 2024-07-18 DIAGNOSIS — E782 Mixed hyperlipidemia: Secondary | ICD-10-CM

## 2024-07-18 DIAGNOSIS — Z720 Tobacco use: Secondary | ICD-10-CM

## 2024-07-18 DIAGNOSIS — I739 Peripheral vascular disease, unspecified: Secondary | ICD-10-CM

## 2024-07-18 DIAGNOSIS — I10 Essential (primary) hypertension: Secondary | ICD-10-CM

## 2024-07-22 ENCOUNTER — Ambulatory Visit: Admitting: Cardiovascular Disease

## 2024-07-22 ENCOUNTER — Encounter: Payer: Self-pay | Admitting: Cardiovascular Disease

## 2024-07-22 VITALS — BP 136/72 | HR 87 | Ht 65.0 in | Wt 168.4 lb

## 2024-07-22 DIAGNOSIS — I10 Essential (primary) hypertension: Secondary | ICD-10-CM

## 2024-07-22 DIAGNOSIS — F17210 Nicotine dependence, cigarettes, uncomplicated: Secondary | ICD-10-CM | POA: Diagnosis not present

## 2024-07-22 DIAGNOSIS — I70211 Atherosclerosis of native arteries of extremities with intermittent claudication, right leg: Secondary | ICD-10-CM

## 2024-07-22 DIAGNOSIS — R7303 Prediabetes: Secondary | ICD-10-CM

## 2024-07-22 DIAGNOSIS — I739 Peripheral vascular disease, unspecified: Secondary | ICD-10-CM

## 2024-07-22 DIAGNOSIS — E782 Mixed hyperlipidemia: Secondary | ICD-10-CM

## 2024-07-22 DIAGNOSIS — I6523 Occlusion and stenosis of bilateral carotid arteries: Secondary | ICD-10-CM

## 2024-07-22 DIAGNOSIS — R0602 Shortness of breath: Secondary | ICD-10-CM | POA: Diagnosis not present

## 2024-07-22 DIAGNOSIS — Z720 Tobacco use: Secondary | ICD-10-CM

## 2024-07-22 NOTE — Progress Notes (Signed)
 "     Cardiology Office Note   Date:  07/22/2024   ID:  MIU CHIONG, DOB 08-08-1950, MRN [id number]  PCP:  Albina GORMAN Dine, MD  Cardiologist:  Denyse Bathe, MD      History of Present Illness: Bethany Lozano is a 74 y.o. female who presents for  Chief Complaint  Patient presents with   Follow-up    Echo follow up    Has neck pains, no chest pain or SOB.      Past Medical History:  Diagnosis Date   Anxiety    Depression    GERD (gastroesophageal reflux disease)    Heart murmur    Hemorrhoids    Hyperlipidemia    Peripheral vascular disease    Sinusitis    Tobacco use      Past Surgical History:  Procedure Laterality Date   ABDOMINAL HYSTERECTOMY  1991   APPENDECTOMY     BICEPT TENODESIS Left 10/08/2017   Procedure: BICEPS TENODESIS;  Surgeon: Tobie Priest, MD;  Location: [facility];  Service: Orthopedics;  Laterality: Left;   CARDIAC CATHETERIZATION N/A 11/08/2015   Procedure: Left Heart Cath and Coronary Angiography;  Surgeon: Denyse DELENA Bathe, MD;  Location: [facility];  Service: Cardiovascular;  Laterality: N/A;   CARDIAC CATHETERIZATION     Mynx placed in right artery after heart catherization   CAROTID STENT INSERTION Right 05/15/2016   COLONOSCOPY  2014   COLONOSCOPY N/A 12/27/2021   Procedure: COLONOSCOPY;  Surgeon: Maryruth Ole DASEN, MD;  Location: [facility];  Service: Endoscopy;  Laterality: N/A;   ENDARTERECTOMY Right 11/25/2015   Procedure: ENDARTERECTOMY CAROTID;  Surgeon: Selinda GORMAN Gu, MD;  Location: [facility];  Service: Vascular;  Laterality: Right;   ESOPHAGOGASTRODUODENOSCOPY N/A 12/27/2021   Procedure: ESOPHAGOGASTRODUODENOSCOPY (EGD);  Surgeon: Maryruth Ole DASEN, MD;  Location: [facility];  Service: Endoscopy;  Laterality: N/A;   FISSURECTOMY  10/22/14   HEMORRHOIDECTOMY WITH HEMORRHOID BANDING  1991,06/20/14, 10/22/14   PERIPHERAL VASCULAR CATHETERIZATION Right 05/15/2016   Procedure: Carotid PTA/Stent Intervention;  Surgeon:  Selinda GORMAN Gu, MD;  Location: [facility];  Service: Cardiovascular;  Laterality: Right;   RESECTION DISTAL CLAVICAL  10/08/2017   Procedure: RESECTION DISTAL CLAVICAL;  Surgeon: Tobie Priest, MD;  Location: [facility];  Service: Orthopedics;;   SHOULDER ARTHROSCOPY WITH SUBACROMIAL DECOMPRESSION Left 10/08/2017   Procedure: SHOULDER ARTHROSCOPY WITH SUBACROMIAL DECOMPRESSION;  Surgeon: Tobie Priest, MD;  Location: [facility];  Service: Orthopedics;  Laterality: Left;   SHOULDER OPEN ROTATOR CUFF REPAIR Left 10/08/2017   Procedure: ROTATOR CUFF REPAIR SHOULDER OPEN Extensive Glenoid Humeral debridement;  Surgeon: Tobie Priest, MD;  Location: [facility];  Service: Orthopedics;  Laterality: Left;   TUBAL LIGATION  1977     Current Outpatient Medications  Medication Sig Dispense Refill   acetaminophen  (TYLENOL ) 500 MG tablet Take 500 mg by mouth every 6 (six) hours as needed for moderate pain.     aspirin  81 MG EC tablet Take 81 mg by mouth daily.     celecoxib  (CELEBREX ) 200 MG capsule Take 200 mg by mouth daily as needed (joint pain).     cetirizine  (ZYRTEC  ALLERGY) 10 MG tablet Take 1 tablet (10 mg total) by mouth daily. 30 tablet 2   chlorthalidone  (HYGROTON ) 25 MG tablet TAKE 1 TABLET (25 MG TOTAL) BY MOUTH DAILY. 90 tablet 1   Cholecalciferol  (VITAMIN D3) 5000 units CAPS Take 1,000 Units by mouth daily.      clonazePAM  (KLONOPIN ) 0.5 MG  tablet TAKE 1 TABLET BY MOUTH EVERY DAY 30 tablet 1   clopidogrel  (PLAVIX ) 75 MG tablet TAKE 1 TABLET BY MOUTH EVERY DAY 90 tablet 3   cyanocobalamin (VITAMIN B12) 1000 MCG tablet Take 1,000 mcg by mouth daily.     DULoxetine  (CYMBALTA ) 60 MG capsule TAKE 1 CAPSULE BY MOUTH EVERY DAY 90 capsule 1   estradiol (ESTRACE) 0.5 MG tablet TAKE 1 TABLET BY MOUTH EVERY DAY 90 tablet 1   Evolocumab (REPATHA SURECLICK) 140 MG/ML SOAJ INJECT 1 INJECTION EVERY 2 WEEKS 90 mL 1   fluticasone  (FLONASE ) 50 MCG/ACT nasal spray Place 1 spray into both nostrils daily as needed for  allergies or rhinitis. 48 mL 1   Magnesium  100 MG TABS Take by mouth.     rosuvastatin  (CRESTOR ) 10 MG tablet TAKE 1 TABLET BY MOUTH EVERY DAY 90 tablet 1   valsartan (DIOVAN) 80 MG tablet TAKE 1 TABLET BY MOUTH EVERY DAY IN THE MORNING 90 tablet 1   No current facility-administered medications for this visit.    Allergies:   Codeine, Latex, Paxlovid  [nirmatrelvir-ritonavir], Adhesive [tape], and Penicillins    Social History:   reports that she has been smoking cigarettes. She has a 45 pack-year smoking history. She has never used smokeless tobacco. She reports that she does not currently use alcohol. She reports that she does not use drugs.   Family History:  family history includes Diabetes in her brother; Heart attack in her father; Hypertension in her mother; Osteoporosis in her mother.    ROS:     Review of Systems  Constitutional: Negative.   HENT: Negative.    Eyes: Negative.   Respiratory: Negative.    Gastrointestinal: Negative.   Genitourinary: Negative.   Musculoskeletal: Negative.   Skin: Negative.   Neurological: Negative.   Endo/Heme/Allergies: Negative.   Psychiatric/Behavioral: Negative.    All other systems reviewed and are negative.     All other systems are reviewed and negative.    PHYSICAL EXAM: VS:  BP 136/72   Pulse 87   Ht 5' 5 (1.651 m)   Wt 168 lb 6.4 oz (76.4 kg)   SpO2 95%   BMI 28.02 kg/m  , BMI Body mass index is 28.02 kg/m. Last weight:  Wt Readings from Last 3 Encounters:  07/22/24 168 lb 6.4 oz (76.4 kg)  06/20/24 163 lb 12.8 oz (74.3 kg)  05/06/24 166 lb 3.2 oz (75.4 kg)     Physical Exam Constitutional:      Appearance: Normal appearance.  Cardiovascular:     Rate and Rhythm: Normal rate and regular rhythm.     Heart sounds: Normal heart sounds.  Pulmonary:     Effort: Pulmonary effort is normal.     Breath sounds: Normal breath sounds.  Musculoskeletal:     Right lower leg: No edema.     Left lower leg: No edema.   Neurological:     Mental Status: She is alert.       EKG:   Recent Labs: 10/26/2023: TSH 0.975 01/25/2024: ALT 25; BUN 8; Creatinine, Ser 0.63; Hemoglobin 14.2; Platelets 226; Potassium 4.1; Sodium 140    Lipid Panel    Component Value Date/Time   CHOL 113 04/29/2024 1029   TRIG 155 (H) 04/29/2024 1029   HDL 54 04/29/2024 1029   CHOLHDL 2.1 04/29/2024 1029   LDLCALC 33 04/29/2024 1029      Other studies Reviewed: Additional studies/ records that were reviewed today include:  Review of the above  records demonstrates:       No data to display            ASSESSMENT AND PLAN:    ICD-10-CM   1. SOB (shortness of breath)  R06.02    LVEF normal, grade 1 diastolic dysfunction, and mild MR. No SOB.    2. Bilateral carotid artery stenosis  I65.23     3. Essential (primary) hypertension  I10     4. Peripheral vascular disease  I73.9     5. Tobacco use  Z72.0     6. Mixed hyperlipidemia  E78.2     7. Prediabetes  R73.03     8. Atherosclerosis of native artery of right lower extremity with intermittent claudication  I70.211        Problem List Items Addressed This Visit       Cardiovascular and Mediastinum   Carotid stenosis   Atherosclerosis of native arteries of extremity with intermittent claudication   Peripheral vascular disease     Other   Hyperlipidemia   Tobacco use   Prediabetes   Other Visit Diagnoses       SOB (shortness of breath)    -  Primary   LVEF normal, grade 1 diastolic dysfunction, and mild MR. No SOB.     Essential (primary) hypertension              Disposition:   Return in about 3 months (around 10/20/2024).    Total time spent: 35 minutes  Signed,  Denyse Bathe, MD  07/22/2024 11:50 AM    [facility] "

## 2024-07-30 ENCOUNTER — Other Ambulatory Visit

## 2024-07-30 DIAGNOSIS — I10 Essential (primary) hypertension: Secondary | ICD-10-CM

## 2024-07-30 DIAGNOSIS — E782 Mixed hyperlipidemia: Secondary | ICD-10-CM

## 2024-07-30 DIAGNOSIS — R7303 Prediabetes: Secondary | ICD-10-CM

## 2024-07-31 LAB — CBC WITH DIFFERENTIAL/PLATELET
Basophils Absolute: 0.1 10*3/uL (ref 0.0–0.2)
Basos: 2 %
EOS (ABSOLUTE): 0.2 10*3/uL (ref 0.0–0.4)
Eos: 4 %
Hematocrit: 44.2 % (ref 34.0–46.6)
Hemoglobin: 14.2 g/dL (ref 11.1–15.9)
Immature Grans (Abs): 0 10*3/uL (ref 0.0–0.1)
Immature Granulocytes: 0 %
Lymphocytes Absolute: 1.2 10*3/uL (ref 0.7–3.1)
Lymphs: 25 %
MCH: 30.1 pg (ref 26.6–33.0)
MCHC: 32.1 g/dL (ref 31.5–35.7)
MCV: 94 fL (ref 79–97)
Monocytes Absolute: 0.5 10*3/uL (ref 0.1–0.9)
Monocytes: 10 %
Neutrophils Absolute: 2.8 10*3/uL (ref 1.4–7.0)
Neutrophils: 59 %
Platelets: 246 10*3/uL (ref 150–450)
RBC: 4.71 x10E6/uL (ref 3.77–5.28)
RDW: 13.4 % (ref 11.7–15.4)
WBC: 4.7 10*3/uL (ref 3.4–10.8)

## 2024-07-31 LAB — CMP14+EGFR
ALT: 26 [IU]/L (ref 0–32)
AST: 34 [IU]/L (ref 0–40)
Albumin: 4.7 g/dL (ref 3.8–4.8)
Alkaline Phosphatase: 95 [IU]/L (ref 49–135)
BUN/Creatinine Ratio: 15 (ref 12–28)
BUN: 9 mg/dL (ref 8–27)
Bilirubin Total: 0.4 mg/dL (ref 0.0–1.2)
CO2: 24 mmol/L (ref 20–29)
Calcium: 9.5 mg/dL (ref 8.7–10.3)
Chloride: 96 mmol/L (ref 96–106)
Creatinine, Ser: 0.6 mg/dL (ref 0.57–1.00)
Globulin, Total: 2.1 g/dL (ref 1.5–4.5)
Glucose: 113 mg/dL — ABNORMAL HIGH (ref 70–99)
Potassium: 3.9 mmol/L (ref 3.5–5.2)
Sodium: 137 mmol/L (ref 134–144)
Total Protein: 6.8 g/dL (ref 6.0–8.5)
eGFR: 95 mL/min/{1.73_m2}

## 2024-07-31 LAB — LIPID PANEL
Chol/HDL Ratio: 1.9 ratio (ref 0.0–4.4)
Cholesterol, Total: 115 mg/dL (ref 100–199)
HDL: 60 mg/dL
LDL Chol Calc (NIH): 34 mg/dL (ref 0–99)
Triglycerides: 122 mg/dL (ref 0–149)
VLDL Cholesterol Cal: 21 mg/dL (ref 5–40)

## 2024-07-31 LAB — HEMOGLOBIN A1C
Est. average glucose Bld gHb Est-mCnc: 117 mg/dL
Hgb A1c MFr Bld: 5.7 % — ABNORMAL HIGH (ref 4.8–5.6)

## 2024-08-01 ENCOUNTER — Ambulatory Visit: Payer: Self-pay | Admitting: Internal Medicine

## 2024-08-01 ENCOUNTER — Ambulatory Visit: Admitting: Internal Medicine

## 2024-08-01 VITALS — BP 130/72 | HR 92 | Ht 65.0 in | Wt 166.0 lb

## 2024-08-01 DIAGNOSIS — R7303 Prediabetes: Secondary | ICD-10-CM | POA: Diagnosis not present

## 2024-08-01 DIAGNOSIS — I10 Essential (primary) hypertension: Secondary | ICD-10-CM | POA: Diagnosis not present

## 2024-08-01 DIAGNOSIS — J301 Allergic rhinitis due to pollen: Secondary | ICD-10-CM | POA: Diagnosis not present

## 2024-08-01 DIAGNOSIS — F418 Other specified anxiety disorders: Secondary | ICD-10-CM

## 2024-08-01 DIAGNOSIS — I739 Peripheral vascular disease, unspecified: Secondary | ICD-10-CM | POA: Diagnosis not present

## 2024-08-01 MED ORDER — CHLORTHALIDONE 25 MG PO TABS: 25.0000 mg | ORAL_TABLET | Freq: Every day | ORAL | 1 refills | Status: AC

## 2024-08-01 MED ORDER — CLONAZEPAM 0.5 MG PO TABS: 0.5000 mg | ORAL_TABLET | Freq: Every day | ORAL | 0 refills | Status: AC

## 2024-08-01 MED ORDER — CETIRIZINE HCL 10 MG PO TABS: 10.0000 mg | ORAL_TABLET | Freq: Every day | ORAL | 0 refills | Status: AC

## 2024-08-08 ENCOUNTER — Other Ambulatory Visit: Payer: Self-pay | Admitting: Neurosurgery

## 2024-08-08 DIAGNOSIS — M4802 Spinal stenosis, cervical region: Secondary | ICD-10-CM

## 2024-10-21 ENCOUNTER — Ambulatory Visit: Admitting: Cardiovascular Disease

## 2024-10-31 ENCOUNTER — Ambulatory Visit: Admitting: Internal Medicine

## 2024-11-04 ENCOUNTER — Encounter (INDEPENDENT_AMBULATORY_CARE_PROVIDER_SITE_OTHER)

## 2024-11-04 ENCOUNTER — Ambulatory Visit (INDEPENDENT_AMBULATORY_CARE_PROVIDER_SITE_OTHER): Admitting: Vascular Surgery
